# Patient Record
Sex: Male | Born: 1965 | Race: Black or African American | Hispanic: No | Marital: Married | Smoking: Former smoker
Health system: Southern US, Community
[De-identification: ages and names within clinical notes are randomized; demographics above are authoritative.]

## PROBLEM LIST (undated history)

## (undated) DIAGNOSIS — N289 Disorder of kidney and ureter, unspecified: Secondary | ICD-10-CM

## (undated) DIAGNOSIS — I509 Heart failure, unspecified: Secondary | ICD-10-CM

## (undated) DIAGNOSIS — R188 Other ascites: Secondary | ICD-10-CM

## (undated) DIAGNOSIS — K7460 Unspecified cirrhosis of liver: Secondary | ICD-10-CM

## (undated) DIAGNOSIS — I8500 Esophageal varices without bleeding: Secondary | ICD-10-CM

## (undated) DIAGNOSIS — N189 Chronic kidney disease, unspecified: Secondary | ICD-10-CM

## (undated) HISTORY — PX: ESOPHAGOGASTRODUODENOSCOPY W/ BANDING: SHX1530

---

## 2015-02-06 ENCOUNTER — Emergency Department (HOSPITAL_COMMUNITY): Payer: Medicaid - Out of State

## 2015-02-06 ENCOUNTER — Inpatient Hospital Stay (HOSPITAL_COMMUNITY)
Admission: EM | Admit: 2015-02-06 | Discharge: 2015-02-26 | DRG: 870 | Disposition: A | Discharge status: Home / Self Care | Payer: Medicaid - Out of State | Attending: Internal Medicine | Admitting: Internal Medicine

## 2015-02-06 ENCOUNTER — Encounter (HOSPITAL_COMMUNITY)
Admission: EM | Disposition: A | Discharge status: Home / Self Care | Payer: Self-pay | Source: Home / Self Care | Attending: Pulmonary Disease

## 2015-02-06 ENCOUNTER — Inpatient Hospital Stay (HOSPITAL_COMMUNITY): Payer: Medicaid - Out of State

## 2015-02-06 ENCOUNTER — Encounter (HOSPITAL_COMMUNITY): Payer: Self-pay | Admitting: *Deleted

## 2015-02-06 ENCOUNTER — Other Ambulatory Visit (HOSPITAL_COMMUNITY): Payer: PRIVATE HEALTH INSURANCE

## 2015-02-06 DIAGNOSIS — E669 Obesity, unspecified: Secondary | ICD-10-CM | POA: Diagnosis present

## 2015-02-06 DIAGNOSIS — R931 Abnormal findings on diagnostic imaging of heart and coronary circulation: Secondary | ICD-10-CM | POA: Diagnosis not present

## 2015-02-06 DIAGNOSIS — K7040 Alcoholic hepatic failure without coma: Secondary | ICD-10-CM | POA: Diagnosis present

## 2015-02-06 DIAGNOSIS — R1900 Intra-abdominal and pelvic swelling, mass and lump, unspecified site: Secondary | ICD-10-CM | POA: Diagnosis not present

## 2015-02-06 DIAGNOSIS — I8500 Esophageal varices without bleeding: Secondary | ICD-10-CM | POA: Diagnosis present

## 2015-02-06 DIAGNOSIS — D6959 Other secondary thrombocytopenia: Secondary | ICD-10-CM | POA: Diagnosis present

## 2015-02-06 DIAGNOSIS — J9601 Acute respiratory failure with hypoxia: Secondary | ICD-10-CM | POA: Diagnosis not present

## 2015-02-06 DIAGNOSIS — R34 Anuria and oliguria: Secondary | ICD-10-CM | POA: Diagnosis present

## 2015-02-06 DIAGNOSIS — Z87891 Personal history of nicotine dependence: Secondary | ICD-10-CM | POA: Diagnosis not present

## 2015-02-06 DIAGNOSIS — Z825 Family history of asthma and other chronic lower respiratory diseases: Secondary | ICD-10-CM | POA: Diagnosis not present

## 2015-02-06 DIAGNOSIS — N182 Chronic kidney disease, stage 2 (mild): Secondary | ICD-10-CM | POA: Diagnosis present

## 2015-02-06 DIAGNOSIS — R198 Other specified symptoms and signs involving the digestive system and abdomen: Secondary | ICD-10-CM

## 2015-02-06 DIAGNOSIS — K921 Melena: Secondary | ICD-10-CM | POA: Diagnosis not present

## 2015-02-06 DIAGNOSIS — Z7189 Other specified counseling: Secondary | ICD-10-CM

## 2015-02-06 DIAGNOSIS — G9340 Encephalopathy, unspecified: Secondary | ICD-10-CM | POA: Diagnosis present

## 2015-02-06 DIAGNOSIS — I509 Heart failure, unspecified: Secondary | ICD-10-CM

## 2015-02-06 DIAGNOSIS — K560 Paralytic ileus: Secondary | ICD-10-CM | POA: Diagnosis not present

## 2015-02-06 DIAGNOSIS — A419 Sepsis, unspecified organism: Principal | ICD-10-CM | POA: Diagnosis present

## 2015-02-06 DIAGNOSIS — Z6834 Body mass index (BMI) 34.0-34.9, adult: Secondary | ICD-10-CM | POA: Diagnosis not present

## 2015-02-06 DIAGNOSIS — I5033 Acute on chronic diastolic (congestive) heart failure: Secondary | ICD-10-CM | POA: Diagnosis present

## 2015-02-06 DIAGNOSIS — Z452 Encounter for adjustment and management of vascular access device: Secondary | ICD-10-CM

## 2015-02-06 DIAGNOSIS — K250 Acute gastric ulcer with hemorrhage: Secondary | ICD-10-CM | POA: Diagnosis present

## 2015-02-06 DIAGNOSIS — K7682 Hepatic encephalopathy: Secondary | ICD-10-CM

## 2015-02-06 DIAGNOSIS — K5900 Constipation, unspecified: Secondary | ICD-10-CM

## 2015-02-06 DIAGNOSIS — N179 Acute kidney failure, unspecified: Secondary | ICD-10-CM | POA: Diagnosis not present

## 2015-02-06 DIAGNOSIS — R531 Weakness: Secondary | ICD-10-CM

## 2015-02-06 DIAGNOSIS — E872 Acidosis: Secondary | ICD-10-CM | POA: Diagnosis present

## 2015-02-06 DIAGNOSIS — D696 Thrombocytopenia, unspecified: Secondary | ICD-10-CM | POA: Diagnosis present

## 2015-02-06 DIAGNOSIS — R0602 Shortness of breath: Secondary | ICD-10-CM

## 2015-02-06 DIAGNOSIS — K567 Ileus, unspecified: Secondary | ICD-10-CM

## 2015-02-06 DIAGNOSIS — J9600 Acute respiratory failure, unspecified whether with hypoxia or hypercapnia: Secondary | ICD-10-CM

## 2015-02-06 DIAGNOSIS — R188 Other ascites: Secondary | ICD-10-CM | POA: Diagnosis not present

## 2015-02-06 DIAGNOSIS — D684 Acquired coagulation factor deficiency: Secondary | ICD-10-CM | POA: Diagnosis present

## 2015-02-06 DIAGNOSIS — I4729 Other ventricular tachycardia: Secondary | ICD-10-CM

## 2015-02-06 DIAGNOSIS — I472 Ventricular tachycardia: Secondary | ICD-10-CM | POA: Diagnosis present

## 2015-02-06 DIAGNOSIS — K7031 Alcoholic cirrhosis of liver with ascites: Secondary | ICD-10-CM | POA: Diagnosis present

## 2015-02-06 DIAGNOSIS — R0902 Hypoxemia: Secondary | ICD-10-CM

## 2015-02-06 DIAGNOSIS — K7469 Other cirrhosis of liver: Secondary | ICD-10-CM | POA: Diagnosis not present

## 2015-02-06 DIAGNOSIS — Z515 Encounter for palliative care: Secondary | ICD-10-CM

## 2015-02-06 DIAGNOSIS — R6521 Severe sepsis with septic shock: Secondary | ICD-10-CM | POA: Diagnosis present

## 2015-02-06 DIAGNOSIS — B9689 Other specified bacterial agents as the cause of diseases classified elsewhere: Secondary | ICD-10-CM | POA: Diagnosis present

## 2015-02-06 DIAGNOSIS — J9690 Respiratory failure, unspecified, unspecified whether with hypoxia or hypercapnia: Secondary | ICD-10-CM

## 2015-02-06 DIAGNOSIS — E876 Hypokalemia: Secondary | ICD-10-CM | POA: Diagnosis not present

## 2015-02-06 DIAGNOSIS — R0600 Dyspnea, unspecified: Secondary | ICD-10-CM | POA: Diagnosis not present

## 2015-02-06 DIAGNOSIS — Z978 Presence of other specified devices: Secondary | ICD-10-CM

## 2015-02-06 DIAGNOSIS — Z0189 Encounter for other specified special examinations: Secondary | ICD-10-CM

## 2015-02-06 DIAGNOSIS — Z66 Do not resuscitate: Secondary | ICD-10-CM | POA: Diagnosis present

## 2015-02-06 DIAGNOSIS — K652 Spontaneous bacterial peritonitis: Secondary | ICD-10-CM | POA: Diagnosis present

## 2015-02-06 DIAGNOSIS — K7460 Unspecified cirrhosis of liver: Secondary | ICD-10-CM | POA: Diagnosis present

## 2015-02-06 DIAGNOSIS — K922 Gastrointestinal hemorrhage, unspecified: Secondary | ICD-10-CM | POA: Diagnosis not present

## 2015-02-06 DIAGNOSIS — Z8711 Personal history of peptic ulcer disease: Secondary | ICD-10-CM

## 2015-02-06 DIAGNOSIS — K7290 Hepatic failure, unspecified without coma: Secondary | ICD-10-CM | POA: Diagnosis not present

## 2015-02-06 DIAGNOSIS — D62 Acute posthemorrhagic anemia: Secondary | ICD-10-CM | POA: Diagnosis present

## 2015-02-06 DIAGNOSIS — I359 Nonrheumatic aortic valve disorder, unspecified: Secondary | ICD-10-CM | POA: Diagnosis not present

## 2015-02-06 DIAGNOSIS — N189 Chronic kidney disease, unspecified: Secondary | ICD-10-CM | POA: Diagnosis present

## 2015-02-06 DIAGNOSIS — Z4659 Encounter for fitting and adjustment of other gastrointestinal appliance and device: Secondary | ICD-10-CM

## 2015-02-06 DIAGNOSIS — T829XXA Unspecified complication of cardiac and vascular prosthetic device, implant and graft, initial encounter: Secondary | ICD-10-CM

## 2015-02-06 HISTORY — DX: Heart failure, unspecified: I50.9

## 2015-02-06 HISTORY — DX: Esophageal varices without bleeding: I85.00

## 2015-02-06 HISTORY — PX: ESOPHAGOGASTRODUODENOSCOPY: SHX5428

## 2015-02-06 HISTORY — DX: Chronic kidney disease, unspecified: N18.9

## 2015-02-06 HISTORY — DX: Unspecified cirrhosis of liver: K74.60

## 2015-02-06 HISTORY — DX: Other ascites: R18.8

## 2015-02-06 LAB — CBC
HEMATOCRIT: 18.2 % — AB (ref 39.0–52.0)
HEMOGLOBIN: 6.2 g/dL — AB (ref 13.0–17.0)
MCH: 28.3 pg (ref 26.0–34.0)
MCHC: 34.1 g/dL (ref 30.0–36.0)
MCV: 83.1 fL (ref 78.0–100.0)
PLATELETS: 101 10*3/uL — AB (ref 150–400)
RBC: 2.19 MIL/uL — ABNORMAL LOW (ref 4.22–5.81)
RDW: 16.7 % — ABNORMAL HIGH (ref 11.5–15.5)
WBC: 10 10*3/uL (ref 4.0–10.5)

## 2015-02-06 LAB — BLOOD GAS, ARTERIAL
Acid-base deficit: 0.8 mmol/L (ref 0.0–2.0)
Acid-base deficit: 1.9 mmol/L (ref 0.0–2.0)
Bicarbonate: 22.7 mEq/L (ref 20.0–24.0)
Bicarbonate: 21.7 mEq/L (ref 20.0–24.0)
DRAWN BY: 103701
Drawn by: 295031
FIO2: 1
MECHVT: 580 mL
O2 Content: 2 L/min
O2 Saturation: 98.6 %
O2 Saturation: 100 %
PATIENT TEMPERATURE: 98.6
PEEP/CPAP: 5 cmH2O
PO2 ART: 478 mmHg — AB (ref 80.0–100.0)
Patient temperature: 98.6
RATE: 15 resp/min
TCO2: 22.2 mmol/L (ref 0–100)
TCO2: 20.7 mmol/L (ref 0–100)
pCO2 arterial: 33.1 mmHg — ABNORMAL LOW (ref 35.0–45.0)
pCO2 arterial: 34.2 mmHg — ABNORMAL LOW (ref 35.0–45.0)
pH, Arterial: 7.45 (ref 7.350–7.450)
pH, Arterial: 7.419 (ref 7.350–7.450)
pO2, Arterial: 119 mmHg — ABNORMAL HIGH (ref 80.0–100.0)

## 2015-02-06 LAB — URINALYSIS, ROUTINE W REFLEX MICROSCOPIC
Glucose, UA: NEGATIVE mg/dL
KETONES UR: NEGATIVE mg/dL
NITRITE: NEGATIVE
Protein, ur: 30 mg/dL — AB
SPECIFIC GRAVITY, URINE: 1.02 (ref 1.005–1.030)
Urobilinogen, UA: 0.2 mg/dL (ref 0.0–1.0)
pH: 5.5 (ref 5.0–8.0)

## 2015-02-06 LAB — CBC WITH DIFFERENTIAL/PLATELET
Basophils Absolute: 0 10*3/uL (ref 0.0–0.1)
Basophils Relative: 1 % (ref 0–1)
Eosinophils Absolute: 0.1 10*3/uL (ref 0.0–0.7)
Eosinophils Relative: 1 % (ref 0–5)
HCT: 18 % — ABNORMAL LOW (ref 39.0–52.0)
HEMOGLOBIN: 6.1 g/dL — AB (ref 13.0–17.0)
Lymphocytes Relative: 38 % (ref 12–46)
Lymphs Abs: 3.2 10*3/uL (ref 0.7–4.0)
MCH: 28.8 pg (ref 26.0–34.0)
MCHC: 33.9 g/dL (ref 30.0–36.0)
MCV: 84.9 fL (ref 78.0–100.0)
MONO ABS: 1.1 10*3/uL — AB (ref 0.1–1.0)
MONOS PCT: 13 % — AB (ref 3–12)
NEUTROS PCT: 47 % (ref 43–77)
Neutro Abs: 4 10*3/uL (ref 1.7–7.7)
Platelets: 126 10*3/uL — ABNORMAL LOW (ref 150–400)
RBC: 2.12 MIL/uL — AB (ref 4.22–5.81)
RDW: 17.4 % — ABNORMAL HIGH (ref 11.5–15.5)
WBC: 8.5 10*3/uL (ref 4.0–10.5)

## 2015-02-06 LAB — APTT: APTT: 45 s — AB (ref 24–37)

## 2015-02-06 LAB — PROTIME-INR
INR: 1.87 — ABNORMAL HIGH (ref 0.00–1.49)
Prothrombin Time: 21.5 seconds — ABNORMAL HIGH (ref 11.6–15.2)

## 2015-02-06 LAB — I-STAT CG4 LACTIC ACID, ED: Lactic Acid, Venous: 2.99 mmol/L (ref 0.5–2.0)

## 2015-02-06 LAB — COMPREHENSIVE METABOLIC PANEL
ALT: 16 U/L — ABNORMAL LOW (ref 17–63)
ANION GAP: 2 — AB (ref 5–15)
AST: 49 U/L — AB (ref 15–41)
Albumin: 1.6 g/dL — ABNORMAL LOW (ref 3.5–5.0)
Alkaline Phosphatase: 75 U/L (ref 38–126)
BUN: 26 mg/dL — AB (ref 6–20)
CALCIUM: 7.3 mg/dL — AB (ref 8.9–10.3)
CO2: 24 mmol/L (ref 22–32)
Chloride: 110 mmol/L (ref 101–111)
Creatinine, Ser: 2.17 mg/dL — ABNORMAL HIGH (ref 0.61–1.24)
GFR calc Af Amer: 39 mL/min — ABNORMAL LOW (ref >60)
GFR calc non Af Amer: 33 mL/min — ABNORMAL LOW (ref >60)
Glucose, Bld: 107 mg/dL — ABNORMAL HIGH (ref 65–99)
Potassium: 3.8 mmol/L (ref 3.5–5.1)
Sodium: 136 mmol/L (ref 135–145)
Total Bilirubin: 1.8 mg/dL — ABNORMAL HIGH (ref 0.3–1.2)
Total Protein: 6.7 g/dL (ref 6.5–8.1)

## 2015-02-06 LAB — URINE MICROSCOPIC-ADD ON

## 2015-02-06 LAB — LACTIC ACID, PLASMA: Lactic Acid, Venous: 3.1 mmol/L (ref 0.5–2.0)

## 2015-02-06 LAB — PREPARE RBC (CROSSMATCH)

## 2015-02-06 LAB — I-STAT TROPONIN, ED: TROPONIN I, POC: 0 ng/mL (ref 0.00–0.08)

## 2015-02-06 LAB — GLUCOSE, CAPILLARY
Glucose-Capillary: 109 mg/dL — ABNORMAL HIGH (ref 65–99)
Glucose-Capillary: 107 mg/dL — ABNORMAL HIGH (ref 65–99)

## 2015-02-06 LAB — MRSA PCR SCREENING: MRSA by PCR: NEGATIVE

## 2015-02-06 LAB — ABO/RH: ABO/RH(D): O POS

## 2015-02-06 LAB — AMMONIA: AMMONIA: 101 umol/L — AB (ref 9–35)

## 2015-02-06 LAB — FIBRINOGEN: FIBRINOGEN: 108 mg/dL — AB (ref 204–475)

## 2015-02-06 LAB — BRAIN NATRIURETIC PEPTIDE: B Natriuretic Peptide: 25.4 pg/mL (ref 0.0–100.0)

## 2015-02-06 LAB — LIPASE, BLOOD: Lipase: 51 U/L (ref 22–51)

## 2015-02-06 SURGERY — EGD (ESOPHAGOGASTRODUODENOSCOPY)
Anesthesia: Moderate Sedation

## 2015-02-06 MED ORDER — SODIUM CHLORIDE 0.9 % IV BOLUS (SEPSIS)
1000.0000 mL | Freq: Once | INTRAVENOUS | Status: AC
  Administered 2015-02-06: 1000 mL via INTRAVENOUS

## 2015-02-06 MED ORDER — SODIUM CHLORIDE 0.9 % IV SOLN
Freq: Once | INTRAVENOUS | Status: AC
  Administered 2015-02-06: 17:00:00 via INTRAVENOUS

## 2015-02-06 MED ORDER — FENTANYL CITRATE (PF) 100 MCG/2ML IJ SOLN
25.0000 ug | INTRAMUSCULAR | Status: DC | PRN
  Administered 2015-02-06 – 2015-02-07 (×7): 50 ug via INTRAVENOUS
  Administered 2015-02-08: 25 ug via INTRAVENOUS
  Filled 2015-02-06 (×9): qty 2

## 2015-02-06 MED ORDER — SODIUM CHLORIDE 0.9 % IV SOLN
8.0000 mg/h | INTRAVENOUS | Status: DC
  Administered 2015-02-06: 8 mg/h via INTRAVENOUS
  Filled 2015-02-06 (×2): qty 80

## 2015-02-06 MED ORDER — FENTANYL CITRATE (PF) 100 MCG/2ML IJ SOLN
INTRAMUSCULAR | Status: AC
  Administered 2015-02-06: 50 ug
  Filled 2015-02-06: qty 4

## 2015-02-06 MED ORDER — LIDOCAINE HCL (CARDIAC) 20 MG/ML IV SOLN
INTRAVENOUS | Status: AC
  Filled 2015-02-06: qty 5

## 2015-02-06 MED ORDER — PROPOFOL 1000 MG/100ML IV EMUL
5.0000 ug/kg/min | INTRAVENOUS | Status: DC
Start: 2015-02-06 — End: 2015-02-11
  Administered 2015-02-06 (×3): 10 ug/kg/min via INTRAVENOUS
  Administered 2015-02-06 (×2): 25 ug/kg/min via INTRAVENOUS
  Administered 2015-02-06: 10 ug/kg/min via INTRAVENOUS
  Administered 2015-02-07 (×2): 15 ug/kg/min via INTRAVENOUS
  Filled 2015-02-06 (×5): qty 100

## 2015-02-06 MED ORDER — ANTISEPTIC ORAL RINSE SOLUTION (CORINZ)
7.0000 mL | Freq: Four times a day (QID) | OROMUCOSAL | Status: DC
  Administered 2015-02-07 – 2015-02-16 (×39): 7 mL via OROMUCOSAL

## 2015-02-06 MED ORDER — CEFTRIAXONE SODIUM 1 G IJ SOLR
1.0000 g | INTRAMUSCULAR | Status: DC
  Administered 2015-02-06 – 2015-02-09 (×4): 1 g via INTRAVENOUS
  Filled 2015-02-06 (×4): qty 10

## 2015-02-06 MED ORDER — ROCURONIUM BROMIDE 50 MG/5ML IV SOLN
INTRAVENOUS | Status: AC
  Filled 2015-02-06: qty 2

## 2015-02-06 MED ORDER — ETOMIDATE 2 MG/ML IV SOLN
INTRAVENOUS | Status: AC
  Filled 2015-02-06: qty 20

## 2015-02-06 MED ORDER — PROPOFOL 1000 MG/100ML IV EMUL
INTRAVENOUS | Status: AC
  Filled 2015-02-06: qty 100

## 2015-02-06 MED ORDER — ONDANSETRON HCL 4 MG/2ML IJ SOLN
4.0000 mg | Freq: Once | INTRAMUSCULAR | Status: AC
  Administered 2015-02-06: 4 mg via INTRAVENOUS
  Filled 2015-02-06: qty 2

## 2015-02-06 MED ORDER — ETOMIDATE 2 MG/ML IV SOLN
0.3000 mg/kg | Freq: Once | INTRAVENOUS | Status: AC
  Administered 2015-02-06: 22 mg via INTRAVENOUS
  Filled 2015-02-06: qty 15.51

## 2015-02-06 MED ORDER — ROCURONIUM BROMIDE 50 MG/5ML IV SOLN
1.0000 mg/kg | Freq: Once | INTRAVENOUS | Status: AC
  Administered 2015-02-06: 100 mg via INTRAVENOUS
  Filled 2015-02-06: qty 10.34

## 2015-02-06 MED ORDER — SODIUM CHLORIDE 0.9 % IV SOLN
Freq: Once | INTRAVENOUS | Status: AC
  Administered 2015-02-06: 23:00:00 via INTRAVENOUS

## 2015-02-06 MED ORDER — FENTANYL CITRATE (PF) 100 MCG/2ML IJ SOLN
50.0000 ug | Freq: Once | INTRAMUSCULAR | Status: AC
  Administered 2015-02-06: 50 ug via INTRAVENOUS

## 2015-02-06 MED ORDER — PANTOPRAZOLE SODIUM 40 MG IV SOLR
40.0000 mg | Freq: Two times a day (BID) | INTRAVENOUS | Status: DC
  Administered 2015-02-06 – 2015-02-18 (×24): 40 mg via INTRAVENOUS
  Filled 2015-02-06 (×23): qty 40

## 2015-02-06 MED ORDER — MIDAZOLAM HCL 2 MG/2ML IJ SOLN
INTRAMUSCULAR | Status: AC
  Filled 2015-02-06: qty 4

## 2015-02-06 MED ORDER — ONDANSETRON HCL 4 MG/2ML IJ SOLN
4.0000 mg | INTRAMUSCULAR | Status: DC | PRN
  Administered 2015-02-07: 4 mg via INTRAVENOUS
  Filled 2015-02-06: qty 2

## 2015-02-06 MED ORDER — CHLORHEXIDINE GLUCONATE 0.12% ORAL RINSE (MEDLINE KIT)
15.0000 mL | Freq: Two times a day (BID) | OROMUCOSAL | Status: DC
  Administered 2015-02-07 – 2015-02-16 (×19): 15 mL via OROMUCOSAL

## 2015-02-06 MED ORDER — OCTREOTIDE LOAD VIA INFUSION
50.0000 ug | Freq: Once | INTRAVENOUS | Status: AC
  Administered 2015-02-06: 50 ug via INTRAVENOUS
  Filled 2015-02-06: qty 25

## 2015-02-06 MED ORDER — SODIUM CHLORIDE 0.9 % IV SOLN
10.0000 mL/h | Freq: Once | INTRAVENOUS | Status: AC
  Administered 2015-02-06: 10 mL/h via INTRAVENOUS

## 2015-02-06 MED ORDER — SODIUM CHLORIDE 0.9 % IV SOLN
250.0000 mL | INTRAVENOUS | Status: DC | PRN
  Administered 2015-02-07: 250 mL via INTRAVENOUS

## 2015-02-06 MED ORDER — PANTOPRAZOLE SODIUM 40 MG IV SOLR
40.0000 mg | Freq: Once | INTRAVENOUS | Status: AC
  Administered 2015-02-06: 40 mg via INTRAVENOUS
  Filled 2015-02-06: qty 40

## 2015-02-06 MED ORDER — SODIUM CHLORIDE 0.9 % IV SOLN
50.0000 ug/h | INTRAVENOUS | Status: DC
  Administered 2015-02-06 – 2015-02-07 (×2): 50 ug/h via INTRAVENOUS
  Filled 2015-02-06 (×3): qty 1

## 2015-02-06 MED ORDER — SUCCINYLCHOLINE CHLORIDE 20 MG/ML IJ SOLN
INTRAMUSCULAR | Status: AC
Start: 2015-02-06 — End: 2015-02-07
  Filled 2015-02-06: qty 1

## 2015-02-06 MED ORDER — VITAMIN K1 10 MG/ML IJ SOLN
5.0000 mg | Freq: Once | INTRAVENOUS | Status: AC
  Administered 2015-02-06: 5 mg via INTRAVENOUS
  Filled 2015-02-06: qty 0.5

## 2015-02-06 MED ORDER — PANTOPRAZOLE SODIUM 40 MG IV SOLR
40.0000 mg | Freq: Once | INTRAVENOUS | Status: AC
  Administered 2015-02-08: 40 mg via INTRAVENOUS
  Filled 2015-02-06: qty 40

## 2015-02-06 MED ORDER — FUROSEMIDE 10 MG/ML IJ SOLN
20.0000 mg | Freq: Once | INTRAMUSCULAR | Status: AC
  Administered 2015-02-06: 20 mg via INTRAVENOUS
  Filled 2015-02-06: qty 2

## 2015-02-06 MED ORDER — SODIUM CHLORIDE 0.9 % IV SOLN: 80.0000 mg | Freq: Once | INTRAVENOUS | Status: DC

## 2015-02-06 NOTE — Interval H&P Note (Signed)
History and Physical Interval Note:  02/06/2015 4:34 PM  Francisco [name]  has presented today for surgery, with the diagnosis of varices  The various methods of treatment have been discussed with the patient and family. After consideration of risks, benefits and other options for treatment, the patient has consented to  Procedure(s) with comments: ESOPHAGOGASTRODUODENOSCOPY (EGD) (N/A) - at bedside in ICU as a surgical intervention .  The patient's history has been reviewed, patient examined, no change in status, stable for surgery.  I have reviewed the patient's chart and labs.  Questions were answered to the patient's satisfaction.     Rachael Fee

## 2015-02-06 NOTE — Procedures (Signed)
Central Line Insertion Procedure Note  Consent:  Informed consent was obtained from the patient after discussing the risks of the procedure including bleeding, infection, pneumothorax, and potentially death.  Laterality:  Left Internal Jugular Vein  Description of Procedure: Patient was prepped and draped in sterile fashion.  Using bedside ultrasound the patient's left internal jugular vein was visualized.  The vein was compressible and nonpulsatile.  A total of 1.5 mL of Lidocaine 1% with epinephrine was used to locally anesthetize the patient's skin.  Finder needle was then inserted into the patient's vein under direct ultrasound visualization and blood was aspirated.  The syringe was removed form the needle and blood flow was nonpulsatile.  The guidewire was then inserted through the needle into the patient's vein and needle was then removed.  Guidewire positioning in the vein was confirmed with bedside ultrasound. A skin nick was made with a sterile scalpel.  A dilator was passed over the guidewire into the subcutaneous tissue then removed.  The central venous catheter was then inserted over the guidewire into the vein.  The guidewire was then removed.  All ports were then aspirated and free of air before being flushed with sterile normal saline.  The ports were then clamped.  The catheter was sewen into place and a chlorhexidine bandage was applied.   Post Procedure: 1)  Stat Portable Chest X-ray ordered and showed no pneumothorax with good positioning at bedside 2)  Patient remains intubated for endoscopy procedure

## 2015-02-06 NOTE — Procedures (Signed)
Intubation Procedure Note  Meds 1. Etomidate approximately 26 mg IV 2. Rocuronium 100 mg IV  ETT: 7.5  DESCRIPTION OF PROCEDURE: Informed consent obtained prior to procedure. Timeout was performed to identify patient and procedure. A MacIntosh #4 blade laryngoscope was used to perform endotracheal intubation after patient was given etomidate followed by Rocuronium for rapid sequence intubation. There is complete view of vocal cords. Color change withgraphy occurred 3 times. Bilateral breath sounds were auscultated. Endotracheal tube was secured at 24 cm at the teeth. Patient was started on propofol drip for sedation. Postprocedure stat portable chest x-ray reviewed by me revealed good endotracheal tube positioning above the carina.

## 2015-02-06 NOTE — ED Notes (Signed)
Critical care MD in room speaking with patient and family

## 2015-02-06 NOTE — ED Notes (Signed)
1x unsuccessful lab draw R hand. Patient requested to DC lab draw. Per patient request, asked to see if they were going to need an IV and to wait for the nurse to come back- will notify RN Zella Ball

## 2015-02-06 NOTE — ED Notes (Signed)
Main lab is being called for remaining lab draws

## 2015-02-06 NOTE — H&P (Signed)
PULMONARY / CRITICAL CARE MEDICINE   Name: Francisco [name] MRN: [id number] DOB: 08/19/1965    ADMISSION DATE:  02/06/2015 CONSULTATION DATE:  02/06/15  REFERRING MD :  ED  CHIEF COMPLAINT:  Variceal Bleed & CHF  INITIAL PRESENTATION: Patient admitted with worsening dyspnea and lower extremity edema. Recently discharged from SNF after long hospitalization at OSH in [place] with GIB from esophageal varices post banding.  STUDIES:  Portable CXR 8/6 - Peribronchial cuffing. No consolidation or effusion.  SIGNIFICANT EVENTS: 8/6 - Admit to ICU 8/6 - Transfuse 1u PRBCs  HISTORY OF PRESENT ILLNESS:  [name] with recent prolonged hospitalization April-June 2016. Patient discharged from SNF 1 week ago. Reports he has been having increasing lower extremity edema and had PND last night but no orthopnea. Denies any chest pain or pressure. Ha shad increasing dyspnea as well. Has had some wheezing and nonproductive cough. Has been taking Lasix but reports he was on "2 pills" in the hospital before SNF. Minimal urine output. Has had some abdominal discomfort laying on his side but denies any melena or hematochezia. Had coffee ground emesis this morning and questionable "crimson" emesis per EMS/RN prior but no other nausea/vomiting. No syncope or near syncope. No subjective fever, chills, or sweats. Has received 2 L IVF bolus in ED along with Protonix IV and has been started on an Octreotide drip. ED physician consulted GI for evaluation.  PAST MEDICAL HISTORY :  Past Medical History  Diagnosis Date  . Hepatic cirrhosis   . Esophageal varices     last banding in [place] June 2016  . Chronic renal failure     was on hemodialysis in June 2016  . Congestive heart failure     questionable hx & on lasix  . Ascites    PAST SURGICAL HISTORY: Past Surgical History  Procedure Laterality Date  . Esophagogastroduodenoscopy w/ banding      Prior to Admission medications      HOME MEDICATIONS: 1.   Lactulose q8hr PO 2.  Lasix 40mg  po daily 3.  Ventolin inhaler prn  No Known Allergies  FAMILY HISTORY:  Family History  Problem Relation Age of Onset  . COPD Mother   . Asthma Mother   . Leukemia Maternal Grandmother   . Congestive Heart Failure Maternal Grandfather   . Cancer Other     2 aunts with known ca    SOCIAL HISTORY: History   Social History  . Marital Status: Married    Spouse Name: N/A  . Number of Children: N/A  . Years of Education: N/A   Social History Main Topics  . Smoking status: Former Smoker    Quit date: 10/02/2014  . Smokeless tobacco: Not on file     Comment: off and on for 20 years  . Alcohol Use: No     Comment: Quit drinking EtOH prior to prolonged admission in April 2016  . Drug Use: Not on file  . Sexual Activity: Not on file   Other Topics Concern  . None   Social History Narrative   Patient is from [place]. Hospitalized with variceal bleed and banding this Spring and discharged to [facility] June 2016. Discharged from SNF 1 week ago. Previously worked as a Paediatric nurse. Married.   REVIEW OF SYSTEMS: Denies any sore throat or sinus congestion. No rash or bruising. A pertinent 14 point review of systems is negative except as per the HPI.  SUBJECTIVE:   VITAL SIGNS: Temp:  [98 F (36.7 C)-98.1 F (36.7  C)] 98 F (36.7 C) (08/06 1111) Pulse Rate:  [112-121] 121 (08/06 1111) Resp:  [19-20] 19 (08/06 1111) BP: (100-123)/(73-102) 100/73 mmHg (08/06 1111) SpO2:  [98 %-100 %] 100 % (08/06 1111) HEMODYNAMICS:   VENTILATOR SETTINGS:   INTAKE / OUTPUT: No intake or output data in the 24 hours ending 02/06/15 1154  PHYSICAL EXAMINATION: General:  No distress. Laying nearly flat in bed. Wife & mother at bedside. Neuro:  Awake. Alert & oriented x3. Moving all 4 extremities equally. No meningismus. HEENT:  Tacky MM. No oral ulcers. No scleral injection. Cardiovascular:  Tachycardic. Pitting lower extremity edema. JVD present. Lungs:  Mild crackles  bilateral bases. Normal WOB laying recumbent. On [place] oxygen. Abdomen:  Protuberant. Fluid shift from ascites. Nontender. Musculoskeletal:  Normal tone. No joint deformity or effusion. Strength 5/5 grossly bilaterally. Skin:  Warm & dry. No rash.  LABS:  CBC  Recent Labs Lab 02/06/15 0930  WBC 8.5  HGB 6.1*  HCT 18.0*  PLT 126*   Coag's  Recent Labs Lab 02/06/15 0930  APTT 45*  INR 1.87*   BMET  Recent Labs Lab 02/06/15 0930  NA 136  K 3.8  CL 110  CO2 24  BUN 26*  CREATININE 2.17*  GLUCOSE 107*   Electrolytes  Recent Labs Lab 02/06/15 0930  CALCIUM 7.3*   Sepsis Markers  Recent Labs Lab 02/06/15 0944  LATICACIDVEN 2.99*   ABG  Recent Labs Lab 02/06/15 0856  PHART 7.450  PCO2ART 33.1*  PO2ART 119*   Liver Enzymes  Recent Labs Lab 02/06/15 0930  AST 49*  ALT 16*  ALKPHOS 75  BILITOT 1.8*  ALBUMIN 1.6*   Cardiac Enzymes No results for input(s): TROPONINI, PROBNP in the last 168 hours. Glucose No results for input(s): GLUCAP in the last 168 hours.  Imaging Dg Chest Port 1 View  02/06/2015   CLINICAL DATA:  49 year old male with shortness breath and chest pain for the past 12 hr. Vomiting.  EXAM: PORTABLE CHEST - 1 VIEW  COMPARISON:  No priors.  FINDINGS: Lung volumes are low. Severe diffuse peribronchial cuffing. No consolidative airspace disease. No pleural effusions. No evidence of pulmonary edema. Heart size is normal. Mediastinal contours are unremarkable.  IMPRESSION: 1. Severe diffuse peribronchial cuffing, concerning for severe acute bronchitis.   Electronically Signed   By: Trudie Reed M.D.   On: 02/06/2015 10:32     ASSESSMENT / PLAN:  PULMONARY A: Acute Hypoxic Respiratory Failure - No evidence of CHF on CXR.  P:   Wean FiO2 for Sat >92%. Pulmonary toilet with IS  CARDIOVASCULAR A:  Sinus Tachycardia Questionable h/o CHF  P:  Checking BNP Checking TTE Lasix  IV with transfusion Holding home  Lasix  RENAL A:   CKD - unknown baseline creatinine. Previously on HD. Lactic Acidosis  P:   Monitor UOP Trend daily BUN/Creatinine Monitor electrolytes daily with labs Trending LA q8hr  GASTROINTESTINAL A:   GIB - has been taking Aleve H/O Variceal Bleed - s/p OSH banding June 2016 Liver Cirrhosis Ascites  P:   Holding Lactulose for now NPO GI consulted for GIB Rocephin for SBP prophylaxis Trending Coags, Fibrinogen & Hgb/Hct Protonix gtt Octrotide gtt  HEMATOLOGIC A:   Anemia - Secondary to GIB vs volume overload Coagulopathy - Secondary to cirrhosis Thrombocytopenia - Mild.  P:  Trending Coags Stat fibrinogen Monitor Hgb/Hct q4hr Transfuse for Hgb <7.0 or active bleeding SCDs for DVT prophylaxis Dopplar [place] bilateral LE to rule out DVT  INFECTIOUS  A:   Ascites  P:   Trending leukocyte cough with labs daily  BCx2 8/6>>  Abx:  Rocephin, start date 8/6, day 1/x  ENDOCRINE A:   No acute issues.  P:   Accuchecks q4hr MD to be notified for BG <90 or >160  NEUROLOGIC A:   No acute issues.  P:   RASS goal: N/A Monitor for ecephalopathy off Lactulose Holding Lactulose   FAMILY  - Updates: Wife & mother present for goals of care and code status discussion today.  - Inter-disciplinary family meet or Palliative Care meeting due by:  8/13   TODAY'S SUMMARY: [name] with cirrhosis & ascites as well as known varices s/p banding June 2016. Seemingly has more of a CHF picture despite compliance with Lasix. Likely has a component of variceal bleeding from volume overload. Assessing cardiac function with TTE. GI consult pending for patient's GIB. Continuing Protonix for now in setting of Aleve use as well as Octreotide. Patient full code per my discussion with he and family present today.  I have spent a total of 40 minutes of critical care time today caring for this patient, dicussing goals of care with family, and reviewing the patient's  EMR.  Donna Christen Jamison Neighbor, M.D. [facility] Pager:  [phone] After 3pm or if no response, call [phone] 02/06/2015, 11:54 AM

## 2015-02-06 NOTE — Consult Note (Addendum)
Consultation  Referring Provider: ER MD Primary Care Physician:  Pcp Not In System Primary Gastroenterologist:  ?  Visiting from [place]  Reason for Consultation:  Vomiting blood  HPI: Francisco [name] is a 49 y.o. male  who presented this morning to the emergency room with complaints of weakness and shortness of breath. While being evaluated in the emergency room he vomited 4-500 mL of dark red blood. Apparently he had 1 episode of a similar amount of blood prior to arrival to the emergency room. On further questioning patient says his stools were black yesterday. He is a poor historian but apparently had recently been hospitalized in [place] and was visiting family here this weekend with his wife. He says he just got out of rehabilitation last week after a prolonged illness. 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 He knows that he has had at least one prior variceal bleed and underwent EGD and banding. He is irritable currently not wanting to ask questions and says he is given Aleve because his wife needs to be at work on Monday and he has a GI doctor in [place]. He states he is not currently actively drinking. Labs are reviewed hemoglobin was 6.3 on presentation INR 1.87 creatinine 2.1. There has been some difficulty with blood draws says he is a difficult stick and blood transfusions have not started as yet. Thus far is been hemodynamically stable.    History reviewed. No pertinent past medical history.  History reviewed. No pertinent past surgical history.  Prior to Admission medications   Not on File    Current Facility-Administered Medications  Medication Dose Route Frequency Provider Last Rate Last Dose  . 0.9 %  sodium chloride infusion  10 mL/hr Intravenous Once Lyndal Pulley, MD      . octreotide (SANDOSTATIN) 500 mcg in sodium chloride 0.9 % 250 mL (2 mcg/mL) infusion  50 mcg/hr  Intravenous Continuous Lyndal Pulley, MD 25 mL/hr at 02/06/15 1031 50 mcg/hr at 02/06/15 1031  . pantoprazole (PROTONIX) 80 mg in sodium chloride 0.9 % 250 mL (0.32 mg/mL) infusion  8 mg/hr Intravenous Continuous Lyndal Pulley, MD      . pantoprazole (PROTONIX) injection 40 mg  40 mg Intravenous Once Lyndal Pulley, MD       No current outpatient prescriptions on file.    Allergies as of 02/06/2015  . (No Known Allergies)    History reviewed. No pertinent family history.  History   Social History  . Marital Status: Married    Spouse Name: N/A  . Number of Children: N/A  . Years of Education: N/A   Occupational History  . Not on file.   Social History Main Topics  . Smoking status: Former Games developer  . Smokeless tobacco: Not on file  . Alcohol Use: No  . Drug Use: Not on file  . Sexual Activity: Not on file   Other Topics Concern  . Not on file   Social History Narrative  . No narrative on file    Review of Systems: Pertinent positive and negative review of systems were noted in the above HPI section.  All other review of systems was otherwise negative.  Physical Exam: Vital signs in last 24 hours: Temp:  [98.1 F (36.7 C)] 98.1 F (36.7 C) (08/06 0823) Pulse Rate:  [112] 112 (08/06 0823) Resp:  [20] 20 (08/06 0823) BP: (123)/(102) 123/102 mmHg (08/06 0823) SpO2:  [  98 %] 98 % (08/06 0823)   General:   Alert,  Well-developed, well-nourished, African-American male, tremulous and irritable Head:  Normocephalic and atraumatic. Eyes:  Sclera clear, no icterus.   Conjunctiva pale. Ears:  Normal auditory acuity. Nose:  No deformity, discharge,  or lesions. Mouth:  No deformity or lesions.   Neck:  Supple; no masses or thyromegaly. Lungs:  Clear throughout to auscultation.   No wheezes, crackles, or rhonchi. Heart: Tachycardia  Regular rate and rhythm; no murmurs, clicks, rubs,  or gallops. Abdomen:  Protuberant with tense ascites nontender bowel sounds are present no  definite palpable mass Rectal exam not done patent this patient had witnessed hematemesis in the ER Pulses:  Normal pulses noted. Extremities:  2+ edema to thighs Neurologic:  Alert and  oriented x4;  grossly normal neurologically. Skin:  Intact without significant lesions or rashes.. 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 Hemoglobin 6.3 on arrival. He apparently has prior history of variceal hemorrhage with banding. 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 #3 vitK ordered #4Will need EGD this afternoon in ICU (pt declined), and will need to be intubated for procedure-will discuss with CCM #5 cover with antibiotics in setting of variceal bleed/ascites #6suspect will need CIWA- he is currently threatening to leave [facility]    Amy Esterwood  02/06/2015, 11:11 AM   ________________________________________________________________________  [facility] MD note:  I personally examined the patient, reviewed the data and agree with the assessment and plan described above.  Variceal bleed likely, PUD also possible.  I  recommended that he have EGD this afternoon however he declined.  He is alert and oriented times 3 seems able to make his own medical decisions.  Wants to go home to [place] and see his doctor there instead.  He understands that he could die. In the end we agreed to continue IV meds (PPI, octreotide), transfuse blood products (including FFP and blood), give Vit K.  I will return to see him tomorrow. Please call sooner if there is suspicion of acute rebleeding.   Rob Bunting, MD [facility] Pager [phone]   Addendum: He decided to go ahead with EGD this afternoon after his wife and mother spoke with him.  Will plan for that after blood, FFP, elective ET intubation for airway protection.

## 2015-02-06 NOTE — ED Notes (Signed)
Report called to Volanda Napoleon, RN ICU  Pt to transfer to [phone]

## 2015-02-06 NOTE — Op Note (Signed)
[facility] [address] [place], [address]   ENDOSCOPY PROCEDURE REPORT  PATIENT: Francisco [name], Francisco [name]  MR#: [id number] BIRTHDATE: 1966/04/04 , 49  yrs. old GENDER: male ENDOSCOPIST: Rachael Fee, MD PROCEDURE DATE:  02/06/2015 PROCEDURE:  EGD, diagnostic ASA CLASS:     Class IV INDICATIONS:  cirrhosis, melena, hematemesis; band ligated in [place] 2-3 weeks ago. MEDICATIONS: intubated for airway protection in ICU TOPICAL ANESTHETIC: none  DESCRIPTION OF PROCEDURE: After the risks benefits and alternatives of the procedure were thoroughly explained, informed consent was obtained.  The Pentax Gastroscope [id number] endoscope was introduced through the mouth and advanced to the second portion of the duodenum , Without limitations.  The instrument was slowly withdrawn as the mucosa was fully examined.  There were multiple ulcers, somewhat confluent, at the GE junction. There were no esophageal or gastric varices.  The GE junction ulcers were consistent with ulcers seen after band ligation.  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 There were several small distal gastric ulcers, these were all clean based.  There was no blood in the UGI tract.         The scope was then withdrawn from the patient and the procedure completed.  COMPLICATIONS: There were no immediate complications.  ENDOSCOPIC IMPRESSION: GE junction ulcerations, likely from previous band ligation (in [place]) without any signs of persistent varices.  There were also several small peptic type clean based ulcers in the distal stomach that are likely from his daily NSAID use.  RECOMMENDATIONS: Continue octreotide drip (for another 24-36 hours).  I will change the protonix drip to twice daily IV dosing.  After extubation he can start low salt diet.  He has massive ascites and will likely need large volume paracentesis before discharge. He plans to follow up with [place]  doctors next week.  Should avoid NSAIDs strictly. If tolerated, he should start non-selective B Blocker in addition to adjusting his diuretic regimen prior to discharge.   eSigned:  Rachael Fee, MD 02/06/2015 5:27 PM

## 2015-02-06 NOTE — ED Notes (Signed)
Bed: ZO10 Expected date: 02/06/15 Expected time: 8:06 AM Means of arrival:  Comments: EMS SOB, abd distention

## 2015-02-06 NOTE — Progress Notes (Signed)
eLink Physician-Brief Progress Note Patient Name: Francisco [name] DOB: 1966-04-17 MRN: [id number]   Date of Service  02/06/2015  HPI/Events of Note  Oliguria.   eICU Interventions  Will monitor CVP.      Intervention Category Intermediate Interventions: Oliguria - evaluation and management  Sommer,Steven Eugene 02/06/2015, 11:54 PM

## 2015-02-06 NOTE — H&P (View-Only) (Signed)
Consultation  Referring Provider: ER MD Primary Care Physician:  Pcp Not In System Primary Gastroenterologist:  ?  Visiting from [place]  Reason for Consultation:  Vomiting blood  HPI: Francisco [name] is a 48 y.o. male  who presented this morning to the emergency room with complaints of weakness and shortness of breath. While being evaluated in the emergency room he vomited 4-500 mL of dark red blood. Apparently he had 1 episode of a similar amount of blood prior to arrival to the emergency room. On further questioning patient says his stools were black yesterday. He is a poor historian but apparently had recently been hospitalized in [place] and was visiting family here this weekend with his wife. He says he just got out of rehabilitation last week after a prolonged illness. 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 He knows that he has had at least one prior variceal bleed and underwent EGD and banding. He is irritable currently not wanting to ask questions and says he is given Aleve because his wife needs to be at work on Monday and he has a GI doctor in [place]. He states he is not currently actively drinking. Labs are reviewed hemoglobin was 6.3 on presentation INR 1.87 creatinine 2.1. There has been some difficulty with blood draws says he is a difficult stick and blood transfusions have not started as yet. Thus far is been hemodynamically stable.    History reviewed. No pertinent past medical history.  History reviewed. No pertinent past surgical history.  Prior to Admission medications   Not on File    Current Facility-Administered Medications  Medication Dose Route Frequency Provider Last Rate Last Dose  . 0.9 %  sodium chloride infusion  10 mL/hr Intravenous Once Lyndal Pulley, MD      . octreotide (SANDOSTATIN) 500 mcg in sodium chloride 0.9 % 250 mL (2 mcg/mL) infusion  50 mcg/hr  Intravenous Continuous Lyndal Pulley, MD 25 mL/hr at 02/06/15 1031 50 mcg/hr at 02/06/15 1031  . pantoprazole (PROTONIX) 80 mg in sodium chloride 0.9 % 250 mL (0.32 mg/mL) infusion  8 mg/hr Intravenous Continuous Lyndal Pulley, MD      . pantoprazole (PROTONIX) injection 40 mg  40 mg Intravenous Once Lyndal Pulley, MD       No current outpatient prescriptions on file.    Allergies as of 02/06/2015  . (No Known Allergies)    History reviewed. No pertinent family history.  History   Social History  . Marital Status: Married    Spouse Name: N/A  . Number of Children: N/A  . Years of Education: N/A   Occupational History  . Not on file.   Social History Main Topics  . Smoking status: Former Games developer  . Smokeless tobacco: Not on file  . Alcohol Use: No  . Drug Use: Not on file  . Sexual Activity: Not on file   Other Topics Concern  . Not on file   Social History Narrative  . No narrative on file    Review of Systems: Pertinent positive and negative review of systems were noted in the above HPI section.  All other review of systems was otherwise negative.  Physical Exam: Vital signs in last 24 hours: Temp:  [98.1 F (36.7 C)] 98.1 F (36.7 C) (08/06 0823) Pulse Rate:  [112] 112 (08/06 0823) Resp:  [20] 20 (08/06 0823) BP: (123)/(102) 123/102 mmHg (08/06 0823) SpO2:  [  98 %] 98 % (08/06 0823)   General:   Alert,  Well-developed, well-nourished, African-American male, tremulous and irritable Head:  Normocephalic and atraumatic. Eyes:  Sclera clear, no icterus.   Conjunctiva pale. Ears:  Normal auditory acuity. Nose:  No deformity, discharge,  or lesions. Mouth:  No deformity or lesions.   Neck:  Supple; no masses or thyromegaly. Lungs:  Clear throughout to auscultation.   No wheezes, crackles, or rhonchi. Heart: Tachycardia  Regular rate and rhythm; no murmurs, clicks, rubs,  or gallops. Abdomen:  Protuberant with tense ascites nontender bowel sounds are present no  definite palpable mass Rectal exam not done patent this patient had witnessed hematemesis in the ER Pulses:  Normal pulses noted. Extremities:  2+ edema to thighs Neurologic:  Alert and  oriented x4;  grossly normal neurologically. Skin:  Intact without significant lesions or rashes.. 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 Hemoglobin 6.3 on arrival. He apparently has prior history of variceal hemorrhage with banding. 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 #3 vitK ordered #4Will need EGD this afternoon in ICU (pt declined), and will need to be intubated for procedure-will discuss with CCM #5 cover with antibiotics in setting of variceal bleed/ascites #6suspect will need CIWA- he is currently threatening to leave AMA    Amy Esterwood  02/06/2015, 11:11 AM   ________________________________________________________________________  [facility] MD note:  I personally examined the patient, reviewed the data and agree with the assessment and plan described above.  Variceal bleed likely, PUD also possible.  I  recommended that he have EGD this afternoon however he declined.  He is alert and oriented times 3 seems able to make his own medical decisions.  Wants to go home to [place] and see his doctor there instead.  He understands that he could die. In the end we agreed to continue IV meds (PPI, octreotide), transfuse blood products (including FFP and blood), give Vit K.  I will return to see him tomorrow. Please call sooner if there is suspicion of acute rebleeding.   Rob Bunting, MD [facility] Pager [phone]   Addendum: He decided to go ahead with EGD this afternoon after his wife and mother spoke with him.  Will plan for that after blood, FFP, elective ET intubation for airway protection.

## 2015-02-06 NOTE — ED Notes (Signed)
Unable to get 2nd set of cultures, pt  Upset and won't allow me to palpate hand veins. Pt states it hurts.

## 2015-02-06 NOTE — ED Notes (Signed)
Pt here with sob and abdominal distention

## 2015-02-06 NOTE — ED Provider Notes (Signed)
CSN: [id number]     Arrival date & time 02/06/15  0818 History   First MD Initiated Contact with Patient 02/06/15 [phone]     Chief Complaint  Patient presents with  . Shortness of Breath     (Consider location/radiation/quality/duration/timing/severity/associated sxs/prior Treatment) Patient is a 49 y.o. male presenting with shortness of breath. The history is provided by the patient.  Shortness of Breath Severity:  Moderate Onset quality:  Gradual Duration:  1 day Timing:  Constant Progression:  Worsening Chronicity:  New Relieved by:  Nothing Worsened by:  Nothing tried Ineffective treatments:  None tried Associated symptoms: no fever   Associated symptoms comment:  Leg swelling, thirsty Risk factors comment:  Cirrhosis, ascites, prior renal failure requiring dialysis, from [facility]   History reviewed. No pertinent past medical history. History reviewed. No pertinent past surgical history. History reviewed. No pertinent family history. History  Substance Use Topics  . Smoking status: Former Games developer  . Smokeless tobacco: Not on file  . Alcohol Use: No    Review of Systems  Constitutional: Negative for fever.  Respiratory: Positive for shortness of breath.   All other systems reviewed and are negative.     Allergies  Review of patient's allergies indicates no known allergies.  Home Medications   Prior to Admission medications   Not on File   BP 123/102 mmHg  Pulse 112  Temp(Src) 98.1 F (36.7 C)  Resp 20  SpO2 98% Physical Exam  Constitutional: He is oriented to person, place, and time. He appears well-developed and well-nourished. He appears ill. No distress.  HENT:  Head: Normocephalic and atraumatic.  Dry, cracked lips  Eyes: Conjunctivae are normal.  Neck: Neck supple. No tracheal deviation present.  Cardiovascular: Regular rhythm.  Tachycardia present.   Pulmonary/Chest: Effort normal. No respiratory distress. He has no wheezes.  Low inspiratory  volumes  Abdominal: He exhibits distension and ascites. There is no tenderness.  Neurological: He is alert and oriented to person, place, and time. GCS eye subscore is 4. GCS verbal subscore is 5. GCS motor subscore is 6.  Skin: Skin is warm and dry.  Psychiatric: He has a normal mood and affect.    ED Course  Procedures (including critical care time) Procedure note: Ultrasound Guided Peripheral IV Ultrasound guided peripheral 1.88 inch angiocath IV placement performed by me. Indications: Nursing unable to place IV. Details: The antecubital fossa and upper arm were evaluated with a multifrequency linear probe. Patent brachial veins were noted. 1 attempt was made to cannulate a vein under realtime US guidance with successful cannulation of the vein and catheter placement. There is return of non-pulsatile dark red blood. The patient tolerated the procedure well without complications.   CPT codes: 40102 and [phone]   CRITICAL CARE Performed by: Lyndal Pulley Total critical care time: 39 Critical care time was exclusive of separately billable procedures and treating other patients. Critical care was necessary to treat or prevent imminent or life-threatening deterioration. Critical care was time spent personally by me on the following activities: development of treatment plan with patient and/or surrogate as well as nursing, discussions with consultants, evaluation of patient's response to treatment, examination of patient, obtaining history from patient or surrogate, ordering and performing treatments and interventions, ordering and review of laboratory studies, ordering and review of radiographic studies, pulse oximetry and re-evaluation of patient's condition.   Labs Review Labs Reviewed  CBC WITH DIFFERENTIAL/PLATELET - Abnormal; Notable for the following:    RBC 2.12 (*)  Hemoglobin 6.1 (*)    HCT 18.0 (*)    RDW 17.4 (*)    Platelets 126 (*)    Monocytes Relative 13 (*)    Monocytes  Absolute 1.1 (*)    All other components within normal limits  APTT - Abnormal; Notable for the following:    aPTT 45 (*)    All other components within normal limits  PROTIME-INR - Abnormal; Notable for the following:    Prothrombin Time 21.5 (*)    INR 1.87 (*)    All other components within normal limits  BLOOD GAS, ARTERIAL - Abnormal; Notable for the following:    pCO2 arterial 33.1 (*)    pO2, Arterial 119 (*)    All other components within normal limits  I-STAT CG4 LACTIC ACID, ED - Abnormal; Notable for the following:    Lactic Acid, Venous 2.99 (*)    All other components within normal limits  CULTURE, BLOOD (ROUTINE X 2)  CULTURE, BLOOD (ROUTINE X 2)  COMPREHENSIVE METABOLIC PANEL  LIPASE, BLOOD  URINALYSIS, ROUTINE W REFLEX MICROSCOPIC (NOT AT [facility])  AMMONIA  I-STAT TROPOININ, ED  TYPE AND SCREEN  PREPARE RBC (CROSSMATCH)    Imaging Review Dg Chest Port 1 View  02/06/2015   CLINICAL DATA:  49 year old male with shortness breath and chest pain for the past 12 hr. Vomiting.  EXAM: PORTABLE CHEST - 1 VIEW  COMPARISON:  No priors.  FINDINGS: Lung volumes are low. Severe diffuse peribronchial cuffing. No consolidative airspace disease. No pleural effusions. No evidence of pulmonary edema. Heart size is normal. Mediastinal contours are unremarkable.  IMPRESSION: 1. Severe diffuse peribronchial cuffing, concerning for severe acute bronchitis.   Electronically Signed   By: Trudie Reed M.D.   On: 02/06/2015 10:32   I independently viewed and interpreted the above radiology studies and agree with radiologist report.   EKG Interpretation   Date/Time:  Saturday February 06 2015 08:43:20 EDT Ventricular Rate:  128 PR Interval:  130 QRS Duration: 68 QT Interval:  315 QTC Calculation: 460 R Axis:   -22 Text Interpretation:  Sinus tachycardia Ventricular premature complex LVH  with secondary repolarization abnormality Anterior Q waves, possibly due  to LVH No previous ECGs  available Confirmed by Reyli Schroth MD, Eira Alpert ([id number]) on  02/06/2015 8:47:22 AM      MDM   Final diagnoses:  Shortness of breath  Acute blood loss anemia  Acute upper GI bleed  Cirrhosis of liver with ascites, unspecified hepatic cirrhosis type    49 year old male presents with shortness of breath that started last night accompanied by leg swelling. He states that he feels dehydrated, has felt unwell, and recently traveled here from [place] to visit family. He has a history of cirrhosis and liver failure, renal failure, and prior hospitalization with intubation for unknown reason. His EKG is sinus tachycardia without evidence of acute ischemia, he appears to be breathing appropriately and is not requiring supplemental oxygen to maintain his O2 sat. While awaiting initial lab work patient had an episode of 500 mL of red vomit concerning for variceal bleed given his ascites and ongoing liver failure issues. After discussion with family appears that he has had banding previously for similar issues.  First i-STAT sample hemoglobin is 5.2 confirmed critical low by CBC hemoglobin of 6, 2 units of emergency release blood were ordered gastroenterology was consulted for emergent scope, consult to critical care for admission to ICU level of monitoring. Patient remained in critical condition with persistent tachycardia but  maintained normotension throughout his emergency department course.  Lyndal Pulley, MD 02/06/15 [phone]

## 2015-02-06 NOTE — ED Notes (Signed)
Family at bedside. 

## 2015-02-06 NOTE — Progress Notes (Signed)
CRITICAL VALUE ALERT  Critical value received:  Lactic acid 3.1  Date of notification: 02/06/15  Time of notification: 2003  Critical value read back yes  Nurse who received alert:  Cathryn RN  MD notified (1st page):  Mongul  Time of first page:  2030  MD notified (2nd page):none  Time of second page:none  Responding MD:  Mongul  Time MD responded:  2055

## 2015-02-06 NOTE — ED Notes (Signed)
Notiefed MD Clydene Pugh of iStat lactic result

## 2015-02-07 ENCOUNTER — Inpatient Hospital Stay (HOSPITAL_COMMUNITY): Payer: Medicaid - Out of State

## 2015-02-07 DIAGNOSIS — R0600 Dyspnea, unspecified: Secondary | ICD-10-CM

## 2015-02-07 DIAGNOSIS — R0902 Hypoxemia: Secondary | ICD-10-CM

## 2015-02-07 DIAGNOSIS — K922 Gastrointestinal hemorrhage, unspecified: Secondary | ICD-10-CM

## 2015-02-07 DIAGNOSIS — K7031 Alcoholic cirrhosis of liver with ascites: Secondary | ICD-10-CM

## 2015-02-07 LAB — BODY FLUID CELL COUNT WITH DIFFERENTIAL
Eos, Fluid: 0 %
LYMPHS FL: 45 %
MONOCYTE-MACROPHAGE-SEROUS FLUID: 47 % — AB (ref 50–90)
Neutrophil Count, Fluid: 8 % (ref 0–25)
WBC FLUID: 22 uL (ref 0–1000)

## 2015-02-07 LAB — PREPARE FRESH FROZEN PLASMA
UNIT DIVISION: 0
Unit division: 0

## 2015-02-07 LAB — CBC
HCT: 19.9 % — ABNORMAL LOW (ref 39.0–52.0)
HCT: 22.5 % — ABNORMAL LOW (ref 39.0–52.0)
HEMATOCRIT: 18.6 % — AB (ref 39.0–52.0)
HEMOGLOBIN: 6.7 g/dL — AB (ref 13.0–17.0)
Hemoglobin: 6.4 g/dL — CL (ref 13.0–17.0)
Hemoglobin: 7.9 g/dL — ABNORMAL LOW (ref 13.0–17.0)
MCH: 28.4 pg (ref 26.0–34.0)
MCH: 27.8 pg (ref 26.0–34.0)
MCH: 29.2 pg (ref 26.0–34.0)
MCHC: 34.4 g/dL (ref 30.0–36.0)
MCHC: 33.7 g/dL (ref 30.0–36.0)
MCHC: 35.1 g/dL (ref 30.0–36.0)
MCV: 82.7 fL (ref 78.0–100.0)
MCV: 82.6 fL (ref 78.0–100.0)
MCV: 83 fL (ref 78.0–100.0)
PLATELETS: 80 10*3/uL — AB (ref 150–400)
PLATELETS: 70 10*3/uL — AB (ref 150–400)
Platelets: 76 10*3/uL — ABNORMAL LOW (ref 150–400)
RBC: 2.25 MIL/uL — ABNORMAL LOW (ref 4.22–5.81)
RBC: 2.41 MIL/uL — AB (ref 4.22–5.81)
RBC: 2.71 MIL/uL — AB (ref 4.22–5.81)
RDW: 16.2 % — ABNORMAL HIGH (ref 11.5–15.5)
RDW: 15.7 % — ABNORMAL HIGH (ref 11.5–15.5)
RDW: 15.7 % — ABNORMAL HIGH (ref 11.5–15.5)
WBC: 8.7 10*3/uL (ref 4.0–10.5)
WBC: 6.8 10*3/uL (ref 4.0–10.5)
WBC: 6.5 10*3/uL (ref 4.0–10.5)

## 2015-02-07 LAB — PHOSPHORUS: Phosphorus: 4.2 mg/dL (ref 2.5–4.6)

## 2015-02-07 LAB — COMPREHENSIVE METABOLIC PANEL
ALT: 16 U/L — ABNORMAL LOW (ref 17–63)
AST: 40 U/L (ref 15–41)
Albumin: 1.8 g/dL — ABNORMAL LOW (ref 3.5–5.0)
Alkaline Phosphatase: 58 U/L (ref 38–126)
Anion gap: 7 (ref 5–15)
BUN: 32 mg/dL — AB (ref 6–20)
CALCIUM: 7.6 mg/dL — AB (ref 8.9–10.3)
CO2: 23 mmol/L (ref 22–32)
Chloride: 109 mmol/L (ref 101–111)
Creatinine, Ser: 2.36 mg/dL — ABNORMAL HIGH (ref 0.61–1.24)
GFR calc Af Amer: 35 mL/min — ABNORMAL LOW (ref >60)
GFR, EST NON AFRICAN AMERICAN: 30 mL/min — AB (ref >60)
Glucose, Bld: 107 mg/dL — ABNORMAL HIGH (ref 65–99)
Potassium: 2.9 mmol/L — ABNORMAL LOW (ref 3.5–5.1)
Sodium: 139 mmol/L (ref 135–145)
Total Bilirubin: 2.2 mg/dL — ABNORMAL HIGH (ref 0.3–1.2)
Total Protein: 6.3 g/dL — ABNORMAL LOW (ref 6.5–8.1)

## 2015-02-07 LAB — PREPARE RBC (CROSSMATCH)

## 2015-02-07 LAB — BASIC METABOLIC PANEL
ANION GAP: 5 (ref 5–15)
BUN: 34 mg/dL — ABNORMAL HIGH (ref 6–20)
CHLORIDE: 113 mmol/L — AB (ref 101–111)
CO2: 22 mmol/L (ref 22–32)
Calcium: 7.4 mg/dL — ABNORMAL LOW (ref 8.9–10.3)
Creatinine, Ser: 2.32 mg/dL — ABNORMAL HIGH (ref 0.61–1.24)
GFR, EST AFRICAN AMERICAN: 36 mL/min — AB (ref >60)
GFR, EST NON AFRICAN AMERICAN: 31 mL/min — AB (ref >60)
GLUCOSE: 92 mg/dL (ref 65–99)
Potassium: 3.1 mmol/L — ABNORMAL LOW (ref 3.5–5.1)
Sodium: 140 mmol/L (ref 135–145)

## 2015-02-07 LAB — CREATININE, URINE, RANDOM: Creatinine, Urine: 226.43 mg/dL

## 2015-02-07 LAB — GRAM STAIN

## 2015-02-07 LAB — LACTIC ACID, PLASMA: LACTIC ACID, VENOUS: 1.9 mmol/L (ref 0.5–2.0)

## 2015-02-07 LAB — MAGNESIUM: Magnesium: 1.7 mg/dL (ref 1.7–2.4)

## 2015-02-07 LAB — GLUCOSE, CAPILLARY
GLUCOSE-CAPILLARY: 104 mg/dL — AB (ref 65–99)
Glucose-Capillary: 93 mg/dL (ref 65–99)
Glucose-Capillary: 101 mg/dL — ABNORMAL HIGH (ref 65–99)
Glucose-Capillary: 105 mg/dL — ABNORMAL HIGH (ref 65–99)
Glucose-Capillary: 93 mg/dL (ref 65–99)

## 2015-02-07 LAB — LACTATE DEHYDROGENASE, PLEURAL OR PERITONEAL FLUID: LD, Fluid: 35 U/L — ABNORMAL HIGH (ref 3–23)

## 2015-02-07 LAB — PROTIME-INR
INR: 1.77 — AB (ref 0.00–1.49)
Prothrombin Time: 20.6 seconds — ABNORMAL HIGH (ref 11.6–15.2)

## 2015-02-07 LAB — SODIUM, URINE, RANDOM

## 2015-02-07 LAB — FIBRINOGEN: Fibrinogen: 123 mg/dL — ABNORMAL LOW (ref 204–475)

## 2015-02-07 LAB — APTT: aPTT: 60 seconds — ABNORMAL HIGH (ref 24–37)

## 2015-02-07 MED ORDER — ALBUMIN HUMAN 25 % IV SOLN
50.0000 g | Freq: Once | INTRAVENOUS | Status: AC
  Administered 2015-02-07: 50 g via INTRAVENOUS
  Filled 2015-02-07: qty 50

## 2015-02-07 MED ORDER — MAGNESIUM SULFATE IN D5W 10-5 MG/ML-% IV SOLN
1.0000 g | Freq: Once | INTRAVENOUS | Status: AC
  Administered 2015-02-07: 1 g via INTRAVENOUS
  Filled 2015-02-07: qty 100

## 2015-02-07 MED ORDER — ALBUMIN HUMAN 25 % IV SOLN
INTRAVENOUS | Status: AC
  Filled 2015-02-07: qty 50

## 2015-02-07 MED ORDER — SODIUM CHLORIDE 0.9 % IV BOLUS (SEPSIS)
1000.0000 mL | Freq: Once | INTRAVENOUS | Status: AC
  Administered 2015-02-07: 1000 mL via INTRAVENOUS

## 2015-02-07 MED ORDER — VITAMINS A & D EX OINT
TOPICAL_OINTMENT | CUTANEOUS | Status: AC
  Administered 2015-02-07: 1
  Filled 2015-02-07: qty 5

## 2015-02-07 MED ORDER — ALBUMIN HUMAN 25 % IV SOLN
25.0000 g | Freq: Once | INTRAVENOUS | Status: AC
  Administered 2015-02-08: 25 g via INTRAVENOUS
  Filled 2015-02-07: qty 100

## 2015-02-07 MED ORDER — SODIUM CHLORIDE 0.9 % IV SOLN
Freq: Once | INTRAVENOUS | Status: AC
  Administered 2015-02-07: 18:00:00 via INTRAVENOUS

## 2015-02-07 MED ORDER — SODIUM CHLORIDE 0.9 % IV BOLUS (SEPSIS)
250.0000 mL | Freq: Once | INTRAVENOUS | Status: AC
  Administered 2015-02-07: 250 mL via INTRAVENOUS

## 2015-02-07 MED ORDER — POTASSIUM CHLORIDE 10 MEQ/50ML IV SOLN
10.0000 meq | INTRAVENOUS | Status: AC
  Administered 2015-02-07 (×3): 10 meq via INTRAVENOUS
  Filled 2015-02-07 (×4): qty 50

## 2015-02-07 MED ORDER — SODIUM CHLORIDE 0.9 % IV SOLN: Freq: Once | INTRAVENOUS | Status: DC

## 2015-02-07 NOTE — Progress Notes (Deleted)
  Echocardiogram 2D Echocardiogram has been performed.  Meleny Tregoning A 02/07/2015, 9:30 AM 

## 2015-02-07 NOTE — Progress Notes (Signed)
Utilization review complete. Johnny Latu RN CCM Case Mgmt phone [phone] 

## 2015-02-07 NOTE — Progress Notes (Signed)
eLink Physician-Brief Progress Note Patient Name: Francisco [name] DOB: May 12, 1966 MRN: [id number]   Date of Service  02/07/2015  HPI/Events of Note  Hypotension - SBP in 70's. Patient had paracentesis for 3.8 Liters today. Suspect he is re-accumulating fluid in his peritoneum.   eICU Interventions  Will order: 1. 25% Albumin 25 gm IV now. 2. 0.9 NaCl 1 liter IV over 1 hour now.      Intervention Category Major Interventions: Hypotension - evaluation and management  Sommer,Steven Eugene 02/07/2015, 11:28 PM

## 2015-02-07 NOTE — Progress Notes (Signed)
E-Link MD made aware of patient's SBP running in the 80s, MAP has been > 65 , MD want patient to be monitored and RN to call of SBP go lower.

## 2015-02-07 NOTE — Progress Notes (Deleted)
Bedside consult with Dr. Celene Skeen and Resp/ therapist to manage severe ventilator dysynchony. Versed added to sedation regimen; currently on Fentanyl at max dose of ; Precedex max at 1.2 mcq; Versd at . Vent synchrony achieved. Will closely monitor VS and wean as appropriate.

## 2015-02-07 NOTE — Progress Notes (Signed)
eLink Physician-Brief Progress Note Patient Name: Francisco [name] DOB: 03-23-1966 MRN: [id number]   Date of Service  02/07/2015  HPI/Events of Note  Multiple issues: 1. K+ = 2.9 and Creatinine = 2.36 and 2. Hgb = 6.2 >> 6.4 s/p PRBC transfusion.  eICU Interventions  Will order: 1. Replete KCl cautiously. 2. Transfuse 1 unit PRBC now.      Intervention Category Intermediate Interventions: Electrolyte abnormality - evaluation and management;Other:  Sommer,Steven Dennard Nip 02/07/2015, 4:55 AM

## 2015-02-07 NOTE — Procedures (Addendum)
After d/w PCCM, request for US paracentesis of up to 3-4 liters max. Successful US guided paracentesis from RLQ.  Yielded 3.8L of cloudy yellow fluid.  No immediate complications.  Pt tolerated well.   Specimen was sent for labs.  Brayton El PA-C 02/07/2015 12:06 PM

## 2015-02-07 NOTE — Progress Notes (Signed)
eLink Physician-Brief Progress Note Patient Name: Francisco [name] DOB: [birth year] MRN: [id number]   Date of Service  02/07/2015  HPI/Events of Note  Hb=6.7  eICU Interventions  Transfuse 1u PRBC     Intervention Category Intermediate Interventions: Coagulopathy - evaluation and management  Tranell Wojtkiewicz 02/07/2015, 4:52 PM

## 2015-02-07 NOTE — Progress Notes (Signed)
  Echocardiogram 2D Echocardiogram has been performed.  Virgina Evener A 02/07/2015, 9:30 AM

## 2015-02-07 NOTE — Progress Notes (Signed)
Central Line Withdraw:  1cc of Lidocaine 1% used to locally anesthetize. Central line was withdrawn approximately 3-4 cm under sterile procedure.  Sterile dressing applied and patient sewn into place. Stat portable CXR ordered to confirm placement.  Donna Christen Jamison Neighbor, M.D. [facility] Pager:  [phone] After 3pm or if no response, call [phone]

## 2015-02-07 NOTE — Progress Notes (Signed)
*  Preliminary Results* Bilateral lower extremity venous duplex completed. Study was technically limited due to edema. Visualized veins of bilateral lower extremities are negative for deep vein thrombosis. There is no evidence of Baker's cyst bilaterally.  02/07/2015 9:13 AM  Gertie Fey, RVT, RDCS, RDMS

## 2015-02-07 NOTE — Progress Notes (Signed)
eLink Physician-Brief Progress Note Patient Name: Francisco [name] DOB: July 15, 1965 MRN: [id number]   Date of Service  02/07/2015  HPI/Events of Note  Oliguria. BP = 93/64 and CVP = 14-16. Hx of liver disease. Hepatorenal?  eICU Interventions  Will order: 1. BMP, Urine Sodium and Urine Creatinine now.      Intervention Category Intermediate Interventions: Oliguria - evaluation and management  Sommer,Steven Eugene 02/07/2015, 2:12 AM

## 2015-02-07 NOTE — Progress Notes (Deleted)
Pt is on Bipap 12/6 for OSA with 6Lpm of oxygen bled in via home nasal pillows. Pt does not want to go on Bipap with full face mask to decrease CO2 due to claustrophobia but is willing to try around 2200. RT will return at that time.  

## 2015-02-07 NOTE — Progress Notes (Signed)
PULMONARY / CRITICAL CARE MEDICINE   Name: Francisco [name] MRN: [id number] DOB: [birth year]    ADMISSION DATE:  02/06/2015 CONSULTATION DATE:  02/06/15  REFERRING MD :  ED  CHIEF COMPLAINT:  Variceal Bleed & CHF  INITIAL PRESENTATION: Patient admitted with worsening dyspnea and lower extremity edema. Recently discharged from SNF after long hospitalization at OSH in [place] with GIB from esophageal varices post banding.  STUDIES:  Portable CXR 8/6 - Peribronchial cuffing. No consolidation or effusion.  SIGNIFICANT EVENTS: 8/6 - Admit to ICU 8/6 - Transfuse 1u PRBCs 8/6 - Elective intubation & Left IJ placed 8/6 - EGD w/o varices but w/ small peptic ulcers  SUBJECTIVE:  Patient with episodes of hypotension overnight. Low potassium this morning. Maintained on Propofol drip overnight. UOP declining.  ROS:  Unobtainable as patient is intubated & sedated.  VITAL SIGNS: Temp:  [94.5 F (34.7 C)-98.1 F (36.7 C)] 94.5 F (34.7 C) (08/07 0600) Pulse Rate:  [90-161] 90 (08/07 0700) Resp:  [10-28] 14 (08/07 0700) BP: (79-167)/(44-102) 79/44 mmHg (08/07 0700) SpO2:  [98 %-100 %] 100 % (08/07 0700) FiO2 (%):  [30 %-100 %] 30 % (08/07 0406) Weight:  [227 lb 15.3 oz (103.4 kg)] 227 lb 15.3 oz (103.4 kg) (08/06 1300) HEMODYNAMICS: CVP:  [10 mmHg-16 mmHg] 10 mmHg VENTILATOR SETTINGS: Vent Mode:  [-] PRVC FiO2 (%):  [30 %-100 %] 30 % Set Rate:  [15 bmp] 15 bmp Vt Set:  [580 mL] 580 mL PEEP:  [5 cmH20] 5 cmH20 Plateau Pressure:  [23 cmH20-25 cmH20] 24 cmH20 INTAKE / OUTPUT:  Intake/Output Summary (Last 24 hours) at 02/07/15 0811 Last data filed at 02/07/15 0700  Gross per 24 hour  Intake 2699.13 ml  Output    175 ml  Net 2524.13 ml    PHYSICAL EXAMINATION: General:  No distress. Laying on left side for echo. Sedated. Neuro:  Sedated. PERRL. HEENT:  Tacky MM. ETT in place. No scler injection. Left IJ in place. Cardiovascular:  Reg rate. Pitting lower extremity edema.  Lungs:  Mild  crackles bilateral bases. Symmetric chest rise on vent.  Abdomen:  Protuberant. Fluid shift from ascites but still soft.  Musculoskeletal:  No joint deformity or effusion.  Skin:  Warm & dry. No rash.  LABS:  CBC  Recent Labs Lab 02/06/15 0930 02/06/15 1830 02/07/15 0340  WBC 8.5 10.0 8.7  HGB 6.1* 6.2* 6.4*  HCT 18.0* 18.2* 18.6*  PLT 126* 101* 80*   Coag's  Recent Labs Lab 02/06/15 0930 02/07/15 0340  APTT 45* 60*  INR 1.87* 1.77*   BMET  Recent Labs Lab 02/06/15 0930 02/07/15 0340  NA 136 139  K 3.8 2.9*  CL 110 109  CO2 24 23  BUN 26* 32*  CREATININE 2.17* 2.36*  GLUCOSE 107* 107*   Electrolytes  Recent Labs Lab 02/06/15 0930 02/07/15 0340  CALCIUM 7.3* 7.6*  MG  --  1.7  PHOS  --  4.2   Sepsis Markers  Recent Labs Lab 02/06/15 0944 02/06/15 1830 02/07/15 0340  LATICACIDVEN 2.99* 3.1* 1.9   ABG  Recent Labs Lab 02/06/15 0856 02/06/15 1843  PHART 7.450 7.419  PCO2ART 33.1* 34.2*  PO2ART 119* 478*   Liver Enzymes  Recent Labs Lab 02/06/15 0930 02/07/15 0340  AST 49* 40  ALT 16* 16*  ALKPHOS 75 58  BILITOT 1.8* 2.2*  ALBUMIN 1.6* 1.8*   Cardiac Enzymes No results for input(s): TROPONINI, PROBNP in the last 168 hours. Glucose  Recent Labs Lab 02/06/15  1613 02/06/15 2017  GLUCAP 109* 107*    Imaging Dg Chest Port 1 View  02/06/2015   CLINICAL DATA:  Patient with stroke status post line placement.  EXAM: PORTABLE CHEST - 1 VIEW  COMPARISON:  Earlier same date  FINDINGS: Interval insertion of endotracheal tube terminating 15 mm superior to the carina. New left IJ central venous catheter tip projects over the right atrium. Stable cardiac and mediastinal contours. Low lung volumes. Unchanged diffuse bilateral coarse heterogeneous opacities. No pleural effusion or pneumothorax.  IMPRESSION: New left IJ central venous catheter tip projects over the right atrium, consider retraction.  ET tube terminates in the distal trachea.   Unchanged heterogeneous opacities bilaterally potentially secondary to bronchitis.  These results were called by telephone at the time of interpretation on 02/06/2015 at 4:37 pm to Dr. Lawanda Cousins , who verbally acknowledged these results.   Electronically Signed   By: Annia Belt M.D.   On: 02/06/2015 16:38   Dg Chest Port 1 View  02/06/2015   CLINICAL DATA:  49 year old male with shortness breath and chest pain for the past 12 hr. Vomiting.  EXAM: PORTABLE CHEST - 1 VIEW  COMPARISON:  No priors.  FINDINGS: Lung volumes are low. Severe diffuse peribronchial cuffing. No consolidative airspace disease. No pleural effusions. No evidence of pulmonary edema. Heart size is normal. Mediastinal contours are unremarkable.  IMPRESSION: 1. Severe diffuse peribronchial cuffing, concerning for severe acute bronchitis.   Electronically Signed   By: Trudie Reed M.D.   On: 02/06/2015 10:32   Dg Chest Port 1v Same Day  02/06/2015   CLINICAL DATA:  Hypoxia. Requested per MD to assess whether radiographic distortion affects prior imaging of IJ line and ETT  EXAM: PORTABLE CHEST - 1 VIEW SAME DAY  COMPARISON:  02/06/2015 at 1604 hours  FINDINGS: Left internal jugular central venous line catheter tip again projects in the right atrium.  Endotracheal tube tip projects 1 cm above the carina. This is also stable.  Lung volumes are low. There is interstitial prominence but no areas of lung consolidation and no overt pulmonary edema. No pneumothorax.  IMPRESSION: 1. No change from the earlier study. 2. Left internal jugular central venous line tip again projects in the right atrium. Endotracheal tube tip projects 1 cm above the carina.   Electronically Signed   By: Amie Portland M.D.   On: 02/06/2015 18:00     ASSESSMENT / PLAN:  PULMONARY OETT 8/6>> A: Acute Hypoxic Respiratory Failure - No evidence of CHF on CXR.  P:   Wean FiO2 for Sat >92%. Pulmonary toilet with IS  CARDIOVASCULAR L IJ CVC 8/6>> A:  Sinus  Tachycardia - resolved Questionable h/o CHF  P:  TTE pending Lasix  IV with transfusion Holding home Lasix  RENAL A:   CKD - unknown baseline creatinine. Previously on HD. Lactic Acidosis - Resolving Hypokalemia - Replacing IV  P:   Stat renal US UOsm pending Monitor UOP Maintain MAP>65 Trend daily BUN/Creatinine Monitor electrolytes daily with labs Repeat BMP @ 1500  GASTROINTESTINAL A:   GIB - has been taking Aleve H/O Variceal Bleed - s/p OSH banding June 2016. EGD w/o varices Liver Cirrhosis Ascites Peptic Ulcers  P:   Holding Lactulose for now NPO GI following Rocephin for SBP prophylaxis Trending Coags, Fibrinogen & Hgb/Hct Protonix IV bid D/C Octreotide gtt  HEMATOLOGIC A:   Anemia - Secondary to GIB vs volume overload Coagulopathy - Secondary to cirrhosis Thrombocytopenia - Mild.  P:  S/P FFP & Vitamin K Trending Coags Transfusing 1u PRBCs Monitor Hgb/Hct q4hr Transfuse for Hgb <7.0 or active bleeding SCDs for DVT prophylaxis Dopplar [place] pending  INFECTIOUS A:   Ascites  P:   Trending leukocyte cough with labs daily  BCx2 8/6>>  Abx:  Rocephin, start date 8/6, day 2/x  ENDOCRINE A:   No acute issues.  P:   Accuchecks q4hr MD to be notified for BG <90 or >160  NEUROLOGIC A:   No acute issues.  P:   RASS goal: 0 to -1 Propofol gtt Fentanyl IV prn Holding Lactulose   FAMILY  - Updates: No family at bedside for update.  - Inter-disciplinary family meet or Palliative Care meeting due by:  8/13   TODAY'S SUMMARY: Patient now w/ hypotension with sedation. Holding sedation & giving IVF bolus. Replacing electrolytes & transfusing blood today. Reposition CVC. Patient likely will need paracentesis. UOP marginal & concerning.  I have spent a total of 31 minutes of critical care time today caring for this patient, dicussing goals of care with family, and reviewing the patient's EMR.  Donna Christen Jamison Neighbor, M.D. [facility]  Pulmonary & Critical Care Pager:  [phone] After 3pm or if no response, call [phone] 02/07/2015, 8:11 AM

## 2015-02-07 NOTE — Progress Notes (Addendum)
[facility] Progress Note    Since last GI note: EGD yesterday, see report in chart.  No overt bleeding overnight however Hb is not bumping as expected (he's had 2 units blood in is starting the third unit soon).  Objective: Vital signs in last 24 hours: Temp:  [94.5 F (34.7 C)-98.1 F (36.7 C)] 97.4 F (36.3 C) (08/07 0800) Pulse Rate:  [89-161] 96 (08/07 0830) Resp:  [10-28] 27 (08/07 0830) BP: (66-167)/(44-96) 93/47 mmHg (08/07 0830) SpO2:  [100 %] 100 % (08/07 0830) FiO2 (%):  [30 %-100 %] 30 % (08/07 0842) Weight:  [227 lb 15.3 oz (103.4 kg)] 227 lb 15.3 oz (103.4 kg) (08/06 1300) Last BM Date: 02/05/15 General: intubated, looking about the room Heart: regular rate and rythm Abdomen: soft, non-tender, massive ascites (tense) normal bowel sounds   Lab Results:  Recent Labs  02/06/15 0930 02/06/15 1830 02/07/15 0340  WBC 8.5 10.0 8.7  HGB 6.1* 6.2* 6.4*  PLT 126* 101* 80*  MCV 84.9 83.1 82.7    Recent Labs  02/06/15 0930 02/07/15 0340  NA 136 139  K 3.8 2.9*  CL 110 109  CO2 24 23  GLUCOSE 107* 107*  BUN 26* 32*  CREATININE 2.17* 2.36*  CALCIUM 7.3* 7.6*    Recent Labs  02/06/15 0930 02/07/15 0340  PROT 6.7 6.3*  ALBUMIN 1.6* 1.8*  AST 49* 40  ALT 16* 16*  ALKPHOS 75 58  BILITOT 1.8* 2.2*    Recent Labs  02/06/15 0930 02/07/15 0340  INR 1.87* 1.77*    Studies/Results: Dg Chest Port 1 View  02/06/2015   CLINICAL DATA:  Patient with stroke status post line placement.  EXAM: PORTABLE CHEST - 1 VIEW  COMPARISON:  Earlier same date  FINDINGS: Interval insertion of endotracheal tube terminating 15 mm superior to the carina. New left IJ central venous catheter tip projects over the right atrium. Stable cardiac and mediastinal contours. Low lung volumes. Unchanged diffuse bilateral coarse heterogeneous opacities. No pleural effusion or pneumothorax.  IMPRESSION: New left IJ central venous catheter tip projects over the right atrium,  consider retraction.  ET tube terminates in the distal trachea.  Unchanged heterogeneous opacities bilaterally potentially secondary to bronchitis.  These results were called by telephone at the time of interpretation on 02/06/2015 at 4:37 pm to Dr. Lawanda Cousins , who verbally acknowledged these results.   Electronically Signed   By: Annia Belt M.D.   On: 02/06/2015 16:38   Dg Chest Port 1 View  02/06/2015   CLINICAL DATA:  50 year old male with shortness breath and chest pain for the past 12 hr. Vomiting.  EXAM: PORTABLE CHEST - 1 VIEW  COMPARISON:  No priors.  FINDINGS: Lung volumes are low. Severe diffuse peribronchial cuffing. No consolidative airspace disease. No pleural effusions. No evidence of pulmonary edema. Heart size is normal. Mediastinal contours are unremarkable.  IMPRESSION: 1. Severe diffuse peribronchial cuffing, concerning for severe acute bronchitis.   Electronically Signed   By: Trudie Reed M.D.   On: 02/06/2015 10:32   Dg Chest Port 1v Same Day  02/06/2015   CLINICAL DATA:  Hypoxia. Requested per MD to assess whether radiographic distortion affects prior imaging of IJ line and ETT  EXAM: PORTABLE CHEST - 1 VIEW SAME DAY  COMPARISON:  02/06/2015 at 1604 hours  FINDINGS: Left internal jugular central venous line catheter tip again projects in the right atrium.  Endotracheal tube tip projects 1 cm above the carina. This is also stable.  Lung volumes are low. There is interstitial prominence but no areas of lung consolidation and no overt pulmonary edema. No pneumothorax.  IMPRESSION: 1. No change from the earlier study. 2. Left internal jugular central venous line tip again projects in the right atrium. Endotracheal tube tip projects 1 cm above the carina.   Electronically Signed   By: Amie Portland M.D.   On: 02/06/2015 18:00     Medications: Scheduled Meds: . sodium chloride   Intravenous Once  . antiseptic oral rinse  7 mL Mouth Rinse QID  . cefTRIAXone (ROCEPHIN)  IV  1 g  Intravenous Q24H  . chlorhexidine gluconate  15 mL Mouth Rinse BID  . magnesium sulfate 1 - 4 g bolus IVPB  1 g Intravenous Once  . pantoprazole (PROTONIX) IV  40 mg Intravenous Once  . pantoprazole (PROTONIX) IV  40 mg Intravenous Q12H  . sodium chloride  250 mL Intravenous Once   Continuous Infusions: . propofol (DIPRIVAN) infusion 12.5 mcg/kg/min (02/07/15 0831)   PRN Meds:.sodium chloride, fentaNYL (SUBLIMAZE) injection, ondansetron (ZOFRAN) IV    Assessment/Plan: 49 y.o. male with Etoh cirrhosis, decompensated, with GI bleeding, fluid overload  Source of bleeding is probably from the ulcerated GE junction (sites of previous variceal ligation).  He had no blood in UGI tract yesterday and so I think the bleeding has stopped.  Octreotide was stopped. IV PPI twice daily now.  I think his fluid overload is his biggest issue currently and he will need LVP (I will order). Would be best to be done today to facilitate weaning off vent.  Fluid will need to be sent for SBP check, cytology as well and I will ask that he receive periprocedureal IV albumine (50gm). Current MELD is 24.   Rachael Fee, MD  02/07/2015, 9:06 AM  Gastroenterology Pager [phone]

## 2015-02-08 ENCOUNTER — Encounter (HOSPITAL_COMMUNITY): Payer: Self-pay | Admitting: Gastroenterology

## 2015-02-08 ENCOUNTER — Inpatient Hospital Stay (HOSPITAL_COMMUNITY): Payer: Medicaid - Out of State

## 2015-02-08 DIAGNOSIS — R188 Other ascites: Secondary | ICD-10-CM

## 2015-02-08 DIAGNOSIS — K7469 Other cirrhosis of liver: Secondary | ICD-10-CM

## 2015-02-08 DIAGNOSIS — K7290 Hepatic failure, unspecified without coma: Secondary | ICD-10-CM

## 2015-02-08 LAB — COMPREHENSIVE METABOLIC PANEL
ALBUMIN: 2.5 g/dL — AB (ref 3.5–5.0)
ALK PHOS: 48 U/L (ref 38–126)
ALT: 13 U/L — ABNORMAL LOW (ref 17–63)
AST: 37 U/L (ref 15–41)
Anion gap: 7 (ref 5–15)
BILIRUBIN TOTAL: 2.3 mg/dL — AB (ref 0.3–1.2)
BUN: 33 mg/dL — AB (ref 6–20)
CALCIUM: 7.8 mg/dL — AB (ref 8.9–10.3)
CHLORIDE: 112 mmol/L — AB (ref 101–111)
CO2: 22 mmol/L (ref 22–32)
Creatinine, Ser: 1.96 mg/dL — ABNORMAL HIGH (ref 0.61–1.24)
GFR calc Af Amer: 44 mL/min — ABNORMAL LOW (ref >60)
GFR calc non Af Amer: 38 mL/min — ABNORMAL LOW (ref >60)
Glucose, Bld: 92 mg/dL (ref 65–99)
Potassium: 3.2 mmol/L — ABNORMAL LOW (ref 3.5–5.1)
SODIUM: 141 mmol/L (ref 135–145)
TOTAL PROTEIN: 6.2 g/dL — AB (ref 6.5–8.1)

## 2015-02-08 LAB — GLUCOSE, CAPILLARY
GLUCOSE-CAPILLARY: 98 mg/dL (ref 65–99)
GLUCOSE-CAPILLARY: 112 mg/dL — AB (ref 65–99)
Glucose-Capillary: 73 mg/dL (ref 65–99)
Glucose-Capillary: 89 mg/dL (ref 65–99)
Glucose-Capillary: 92 mg/dL (ref 65–99)
Glucose-Capillary: 95 mg/dL (ref 65–99)
Glucose-Capillary: 100 mg/dL — ABNORMAL HIGH (ref 65–99)

## 2015-02-08 LAB — CBC
HCT: 23.3 % — ABNORMAL LOW (ref 39.0–52.0)
HCT: 25.5 % — ABNORMAL LOW (ref 39.0–52.0)
HEMOGLOBIN: 8.1 g/dL — AB (ref 13.0–17.0)
HEMOGLOBIN: 8.9 g/dL — AB (ref 13.0–17.0)
MCH: 28.7 pg (ref 26.0–34.0)
MCH: 29.2 pg (ref 26.0–34.0)
MCHC: 34.8 g/dL (ref 30.0–36.0)
MCHC: 34.9 g/dL (ref 30.0–36.0)
MCV: 82.6 fL (ref 78.0–100.0)
MCV: 83.6 fL (ref 78.0–100.0)
Platelets: 74 10*3/uL — ABNORMAL LOW (ref 150–400)
Platelets: 82 10*3/uL — ABNORMAL LOW (ref 150–400)
RBC: 2.82 MIL/uL — ABNORMAL LOW (ref 4.22–5.81)
RBC: 3.05 MIL/uL — AB (ref 4.22–5.81)
RDW: 15.5 % (ref 11.5–15.5)
RDW: 16.2 % — AB (ref 11.5–15.5)
WBC: 7.7 10*3/uL (ref 4.0–10.5)
WBC: 8.7 10*3/uL (ref 4.0–10.5)

## 2015-02-08 LAB — MAGNESIUM: MAGNESIUM: 2 mg/dL (ref 1.7–2.4)

## 2015-02-08 LAB — PHOSPHORUS: PHOSPHORUS: 3.4 mg/dL (ref 2.5–4.6)

## 2015-02-08 LAB — TRIGLYCERIDES: TRIGLYCERIDES: 75 mg/dL (ref <150)

## 2015-02-08 LAB — PROTIME-INR
INR: 1.8 — ABNORMAL HIGH (ref 0.00–1.49)
PROTHROMBIN TIME: 20.9 s — AB (ref 11.6–15.2)

## 2015-02-08 LAB — APTT: aPTT: 45 seconds — ABNORMAL HIGH (ref 24–37)

## 2015-02-08 LAB — FIBRINOGEN: Fibrinogen: 108 mg/dL — ABNORMAL LOW (ref 204–475)

## 2015-02-08 MED ORDER — THIAMINE HCL 100 MG/ML IJ SOLN
100.0000 mg | Freq: Every day | INTRAMUSCULAR | Status: DC
  Administered 2015-02-08 – 2015-02-16 (×9): 100 mg via INTRAVENOUS
  Filled 2015-02-08 (×9): qty 2

## 2015-02-08 MED ORDER — PHENYLEPHRINE HCL 10 MG/ML IJ SOLN
0.0000 ug/min | INTRAVENOUS | Status: DC
  Administered 2015-02-08: 120 ug/min via INTRAVENOUS
  Administered 2015-02-08: 100 ug/min via INTRAVENOUS
  Administered 2015-02-08: 120 ug/min via INTRAVENOUS
  Administered 2015-02-08: 90 ug/min via INTRAVENOUS
  Administered 2015-02-09: 120 ug/min via INTRAVENOUS
  Administered 2015-02-09: 100 ug/min via INTRAVENOUS
  Administered 2015-02-09: 95 ug/min via INTRAVENOUS
  Administered 2015-02-10: 135 ug/min via INTRAVENOUS
  Administered 2015-02-10: 70 ug/min via INTRAVENOUS
  Administered 2015-02-10 – 2015-02-11 (×2): 135 ug/min via INTRAVENOUS
  Administered 2015-02-11: 125 ug/min via INTRAVENOUS
  Administered 2015-02-12: 115 ug/min via INTRAVENOUS
  Administered 2015-02-13: 110 ug/min via INTRAVENOUS
  Administered 2015-02-13 (×2): 95 ug/min via INTRAVENOUS
  Administered 2015-02-13 (×2): 100 ug/min via INTRAVENOUS
  Administered 2015-02-14: 80 ug/min via INTRAVENOUS
  Administered 2015-02-14: 100 ug/min via INTRAVENOUS
  Administered 2015-02-14: 90 ug/min via INTRAVENOUS
  Administered 2015-02-15: 75 ug/min via INTRAVENOUS
  Filled 2015-02-08 (×28): qty 4

## 2015-02-08 MED ORDER — FENTANYL CITRATE (PF) 100 MCG/2ML IJ SOLN
100.0000 ug | INTRAMUSCULAR | Status: DC | PRN
  Administered 2015-02-08 – 2015-02-09 (×2): 100 ug via INTRAVENOUS
  Filled 2015-02-08 (×2): qty 2

## 2015-02-08 MED ORDER — SODIUM CHLORIDE 0.9 % IV SOLN
25.0000 ug/h | INTRAVENOUS | Status: DC
  Administered 2015-02-08: 25 ug/h via INTRAVENOUS
  Filled 2015-02-08: qty 50

## 2015-02-08 MED ORDER — DEXTROSE IN LACTATED RINGERS 5 % IV SOLN
INTRAVENOUS | Status: DC
  Administered 2015-02-08: 1000 mL via INTRAVENOUS
  Administered 2015-02-10: 18:00:00 via INTRAVENOUS
  Administered 2015-02-10: 1000 mL via INTRAVENOUS
  Administered 2015-02-12 – 2015-02-14 (×4): via INTRAVENOUS
  Administered 2015-02-16: 1000 mL via INTRAVENOUS
  Administered 2015-02-16: 06:00:00 via INTRAVENOUS
  Administered 2015-02-20: 10 mL via INTRAVENOUS

## 2015-02-08 MED ORDER — SODIUM CHLORIDE 0.9 % IV SOLN
2000.0000 mg | INTRAVENOUS | Status: AC
  Administered 2015-02-08: 2000 mg via INTRAVENOUS
  Filled 2015-02-08: qty 2000

## 2015-02-08 MED ORDER — POTASSIUM CHLORIDE 20 MEQ/15ML (10%) PO SOLN
20.0000 meq | Freq: Once | ORAL | Status: AC
  Administered 2015-02-08: 20 meq
  Filled 2015-02-08: qty 15

## 2015-02-08 MED ORDER — FENTANYL CITRATE (PF) 100 MCG/2ML IJ SOLN
100.0000 ug | INTRAMUSCULAR | Status: AC | PRN
  Administered 2015-02-08 (×3): 100 ug via INTRAVENOUS
  Filled 2015-02-08 (×2): qty 2

## 2015-02-08 MED ORDER — POTASSIUM CHLORIDE 10 MEQ/50ML IV SOLN
10.0000 meq | INTRAVENOUS | Status: AC
  Administered 2015-02-08 (×3): 10 meq via INTRAVENOUS
  Filled 2015-02-08 (×3): qty 50

## 2015-02-08 MED ORDER — SODIUM CHLORIDE 0.9 % IV SOLN
25.0000 ug/h | INTRAVENOUS | Status: DC
  Administered 2015-02-09: 200 ug/h via INTRAVENOUS
  Filled 2015-02-08: qty 50

## 2015-02-08 MED ORDER — SODIUM CHLORIDE 0.9 % IV SOLN
1.0000 mg | Freq: Once | INTRAVENOUS | Status: DC
  Filled 2015-02-08: qty 0.2

## 2015-02-08 MED ORDER — VANCOMYCIN HCL 10 G IV SOLR
1250.0000 mg | INTRAVENOUS | Status: DC
  Administered 2015-02-09: 1250 mg via INTRAVENOUS
  Filled 2015-02-08 (×2): qty 1250

## 2015-02-08 MED ORDER — LACTULOSE 10 GM/15ML PO SOLN
30.0000 g | Freq: Three times a day (TID) | ORAL | Status: DC
  Administered 2015-02-08 – 2015-02-09 (×3): 30 g
  Filled 2015-02-08 (×3): qty 45

## 2015-02-08 MED ORDER — FOLIC ACID 5 MG/ML IJ SOLN
1.0000 mg | Freq: Once | INTRAMUSCULAR | Status: AC
  Administered 2015-02-08: 1 mg via INTRAVENOUS
  Filled 2015-02-08 (×2): qty 0.2

## 2015-02-08 MED ORDER — PHENYLEPHRINE HCL 10 MG/ML IJ SOLN
0.0000 ug/min | INTRAMUSCULAR | Status: DC
  Administered 2015-02-08: 20 ug/min via INTRAVENOUS
  Filled 2015-02-08: qty 1

## 2015-02-08 MED ORDER — FENTANYL CITRATE (PF) 100 MCG/2ML IJ SOLN
100.0000 ug | INTRAMUSCULAR | Status: AC | PRN
  Administered 2015-02-08 (×3): 100 ug via INTRAVENOUS
  Filled 2015-02-08: qty 2

## 2015-02-08 NOTE — Progress Notes (Signed)
Progress Note   Subjective  On vent. Off fentanyl but not yet awake    Objective   Vital signs in last 24 hours: Temp:  [96 F (35.6 C)-97.8 F (36.6 C)] 97.7 F (36.5 C) (08/08 0800) Pulse Rate:  [60-98] 60 (08/08 0847) Resp:  [0-35] 11 (08/08 0900) BP: (76-131)/(47-101) 93/56 mmHg (08/08 0900) SpO2:  [100 %] 100 % (08/08 0900) FiO2 (%):  [30 %] 30 % (08/08 0847) Weight:  [229 lb 8 oz (104.1 kg)-232 lb 12.9 oz (105.6 kg)] 232 lb 12.9 oz (105.6 kg) (08/08 0500) Last BM Date: 02/05/15 General:    black male intubated and critically ill Lungs:  lungs CTA bilaterally Cor:  S1S2 no murmur Abdomen:  Soft, nontender and distended with ascites. Normal bowel sounds. Extremities:  ++ edema edema. \  Intake/Output from previous day: 08/07 0701 - 08/08 0700 In: 3032.6 [I.V.:392.6; Blood:640; IV Piggyback:1700] Out: 1070 [Urine:1070] Intake/Output this shift: Total I/O In: 172.1 [I.V.:72.1; IV Piggyback:100] Out: -   Lab Results:  Recent Labs  02/07/15 1430 02/07/15 2225 02/08/15 0433  WBC 6.8 6.5 7.7  HGB 6.7* 7.9* 8.1*  HCT 19.9* 22.5* 23.3*  PLT 76* 70* 74*   BMET  Recent Labs  02/07/15 0340 02/07/15 1430 02/08/15 0432  NA 139 140 141  K 2.9* 3.1* 3.2*  CL 109 113* 112*  CO2 GLUCOSE 107* 92 92  BUN 32* 34* 33*  CREATININE 2.36* 2.32* 1.96*  CALCIUM 7.6* 7.4* 7.8*   LFT  Recent Labs  02/08/15 0432  PROT 6.2*  ALBUMIN 2.5*  AST 37  ALT 13*  ALKPHOS 48  BILITOT 2.3*   PT/INR  Recent Labs  02/07/15 0340 02/08/15 0432  LABPROT 20.6* 20.9*  INR 1.77* 1.80*      Assessment / Plan:   77. 49 year old male with decompensated cirrhosis. Admitted with ascites, coagulopathy and GI bleed. EGD revealed GI junction ulcerations, probably from previous band ligation in [place]. He also had several clean based gastric ulcers, probably from NSAID use. On Rocephin for SBP prophylaxis. On BID PPI. He got Vit K and FFP for coagulopathy. No further  bleeding, hgb stable. Off Fentanyl, if doesn't wake up soon may need to start treatment for encephalopathy.   2. Anemia of acute blood loss. hgb up from 6.1 to 8.1 after 4 units of blood.   3. Intermittent upper body twitches / shakes. He is now off Fentanyl. Might this be ETOH withdrawal? I spoke with Critical Care PA - Pete, he may need something for possible withdrawals. This could be encephalopaty - Iva Boop, MD, [facility] recheck NH3 continue Tx w/ lactulose, may need Xifaxan   4. Ascites. He has 4 liter removed yesterday. Probably didn't take off more out of concern for kidneys. He still has tense ascites, not sure he could get off vent without another LVP. Anders Simmonds PA to evaluate for bedside paracentesis with IV albumin.   5 Prolonged QT today - CCM addressing   6. Renal failure, acute?      LOS: 2 days   Willette Cluster  02/08/2015, 10:13 AM   [facility] Attending  I have also seen and assessed the patient and agree with the advanced practitioner's assessment and plan. My Hx and PE same. Remains critically ill and on the vent - massive ascites and relative hypotension. Liver failure - aggressive supportive care is helping him.  Iva Boop, MD, [facility] [phone] (pager) 02/08/2015  5:42 PM

## 2015-02-08 NOTE — Progress Notes (Signed)
eLink Physician-Brief Progress Note Patient Name: Ezriel Boffa DOB: [birth year] MRN: [id number]   Date of Service  02/08/2015  HPI/Events of Note  Agitated and asynchronous on the vent.  eICU Interventions  Fentanyl drip ordered not to exceed 200 mcg/hr however.     Intervention Category Major Interventions: Other:  YACOUB,WESAM 02/08/2015, 11:43 PM

## 2015-02-08 NOTE — Progress Notes (Signed)
Fentanyl Drip stopped, wasted 190 ml in sink witnessed by Lezlie Lye RN / Caesar Chestnut RN

## 2015-02-08 NOTE — Progress Notes (Signed)
eLink Physician-Brief Progress Note Patient Name: Francisco [name] DOB: 06-Nov-1965 MRN: [id number]   Date of Service  02/08/2015  HPI/Events of Note  K+ = 3.2 and Creatinine = 1.96.  eICU Interventions  Will cautiously replete K+.      Intervention Category Intermediate Interventions: Electrolyte abnormality - evaluation and management  [name] 02/08/2015, 6:57 AM

## 2015-02-08 NOTE — Progress Notes (Signed)
Date:  February 08, 2015 U.R. performed for needs and level of care. Will continue to follow for Case Management needs.  Kyshaun Barnette, RN, BSN, CCM   [phone] 

## 2015-02-08 NOTE — Progress Notes (Signed)
eLink Physician-Brief Progress Note Patient Name: Francisco [name] DOB: 09-28-1965 MRN: [id number]   Date of Service  02/08/2015  HPI/Events of Note  Hypotension. BP = 78/48. CVP = 11.   eICU Interventions  Will order: 1. Phenylephrine IV infusion. Titrate to MAP >= 65.     Intervention Category Intermediate Interventions: Hypotension - evaluation and management  Maudy Yonan Eugene 02/08/2015, 3:15 AM

## 2015-02-08 NOTE — Progress Notes (Signed)
Initial Nutrition Assessment  DOCUMENTATION CODES:   Obesity unspecified  INTERVENTION:   If pt is to remain intubated for > 48 hours, consider nutrition support. TF recommendations: Initiate Vital HP @ 20 ml/hr and increase by 10 ml every 4 hours to goal rate of 40 ml/hr.   60 ml Prostat BID.    Tube feeding regimen provides 1360 kcal (100% of needs), 144 grams of protein, and 803 ml of H2O.   RD to continue to monitor   NUTRITION DIAGNOSIS:   Inadequate oral intake related to inability to eat as evidenced by NPO status.  GOAL:   Provide needs based on ASPEN/SCCM guidelines  MONITOR:   Vent status, Labs, Weight trends, Skin, I & O's  REASON FOR ASSESSMENT:   Ventilator    ASSESSMENT:   Patient admitted with worsening dyspnea and lower extremity edema. Recently discharged from SNF after long hospitalization at OSH in [place] with GIB from esophageal varices post banding.  Pt in room with no family at bedside. TF recommendations provided above if needed.  Patient is currently intubated on ventilator support MV: 9.9 L/min Temp (24hrs), Avg:96.9 F (36.1 C), Min:96 F (35.6 C), Max:97.8 F (36.6 C)  Propofol:none  Nutrition focused physical exam shows no sign of depletion of muscle mass or body fat.  Labs reviewed: Low K Elevated BUN & Creatinine Mg/Phos WNL  Diet Order:  Diet NPO time specified  Skin:  Reviewed, no issues  Last BM:  8/5  Height:   Ht Readings from Last 1 Encounters:  02/06/15  (1.778 m)    Weight:   Wt Readings from Last 1 Encounters:  02/08/15 232 lb 12.9 oz (105.6 kg)    Ideal Body Weight:  75.5 kg  BMI:  Body mass index is 33.4 kg/(m^2).  Estimated Nutritional Needs:   Kcal:  1161-1478 (11-14 kcal/kg)  Protein:  150-160g  Fluid:  1.5L/day  EDUCATION NEEDS:   No education needs identified at this time  Tilda Franco, MS, RD, LDN Pager: [phone] After Hours Pager: [phone]

## 2015-02-08 NOTE — Progress Notes (Addendum)
PULMONARY / CRITICAL CARE MEDICINE   Name: Francisco [name] MRN: [id number] DOB: 1965-12-31    ADMISSION DATE:  02/06/2015 CONSULTATION DATE:  02/06/15  REFERRING MD :  ED  CHIEF COMPLAINT:  Variceal Bleed & CHF  INITIAL PRESENTATION: Patient admitted with worsening dyspnea and lower extremity edema. Recently discharged from SNF after long hospitalization at OSH in [place] with GIB from esophageal varices post banding.  STUDIES:  Portable CXR 8/6 - Peribronchial cuffing. No consolidation or effusion.  SIGNIFICANT EVENTS: 8/6 - Admit to ICU 8/6 - Transfuse 1u PRBCs 8/6 - Elective intubation & Left IJ placed 8/6 - EGD w/o varices but w/ small peptic ulcers 8/7 paracentesis 3.8 liters.   SUBJECTIVE:    ROS:  Unobtainable as patient is intubated & sedated.  VITAL SIGNS: Temp:  [96 F (35.6 C)-97.8 F (36.6 C)] 97.6 F (36.4 C) (08/08 1036) Pulse Rate:  [60-98] 63 (08/08 1127) Resp:  [0-35] 15 (08/08 1127) BP: (76-131)/(47-101) 93/56 mmHg (08/08 1127) SpO2:  [100 %] 100 % (08/08 1127) FiO2 (%):  [30 %] 30 % (08/08 1127) Weight:  [104.1 kg (229 lb 8 oz)-105.6 kg (232 lb 12.9 oz)] 105.6 kg (232 lb 12.9 oz) (08/08 0500) HEMODYNAMICS: CVP:  [6 mmHg-16 mmHg] 14 mmHg VENTILATOR SETTINGS: Vent Mode:  [-] PRVC FiO2 (%):  [30 %] 30 % Set Rate:  [15 bmp] 15 bmp Vt Set:  [580 mL] 580 mL PEEP:  [5 cmH20] 5 cmH20 Plateau Pressure:  [20 cmH20-33 cmH20] 20 cmH20 INTAKE / OUTPUT:  Intake/Output Summary (Last 24 hours) at 02/08/15 1138 Last data filed at 02/08/15 4696  Gross per 24 hour  Intake 3165.41 ml  Output   1070 ml  Net 2095.41 ml    PHYSICAL EXAMINATION: General:  No distress. But triggering apnea  Neuro:  Sedated. PERRL. HEENT:  Tacky MM. ETT in place.  Cardiovascular:  Reg rate. Pitting lower extremity edema.  Lungs:  Mild crackles bilateral bases. Symmetric chest rise on vent.  Abdomen:  Protuberant. Fluid shift from ascites but still soft.  Musculoskeletal:  No joint  deformity or effusion.  Skin:  Warm & dry. No rash.  LABS:  CBC  Recent Labs Lab 02/07/15 1430 02/07/15 2225 02/08/15 0433  WBC 6.8 6.5 7.7  HGB 6.7* 7.9* 8.1*  HCT 19.9* 22.5* 23.3*  PLT 76* 70* 74*   Coag's  Recent Labs Lab 02/06/15 0930 02/07/15 0340 02/08/15 0432  APTT 45* 60* 45*  INR 1.87* 1.77* 1.80*   BMET  Recent Labs Lab 02/07/15 0340 02/07/15 1430 02/08/15 0432  NA 139 140 141  K 2.9* 3.1* 3.2*  CL 109 113* 112*  CO2 23 22 22   BUN 32* 34* 33*  CREATININE 2.36* 2.32* 1.96*  GLUCOSE 107* 92 92   Electrolytes  Recent Labs Lab 02/07/15 0340 02/07/15 1430 02/08/15 0432  CALCIUM 7.6* 7.4* 7.8*  MG 1.7  --  2.0  PHOS 4.2  --  3.4   Sepsis Markers  Recent Labs Lab 02/06/15 0944 02/06/15 1830 02/07/15 0340  LATICACIDVEN 2.99* 3.1* 1.9   ABG  Recent Labs Lab 02/06/15 0856 02/06/15 1843  PHART 7.450 7.419  PCO2ART 33.1* 34.2*  PO2ART 119* 478*   Liver Enzymes  Recent Labs Lab 02/06/15 0930 02/07/15 0340 02/08/15 0432  AST 49* 40 37  ALT 16* 16* 13*  ALKPHOS 75 58 48  BILITOT 1.8* 2.2* 2.3*  ALBUMIN 1.6* 1.8* 2.5*   Cardiac Enzymes No results for input(s): TROPONINI, PROBNP in the last 168 hours.  Glucose  Recent Labs Lab 02/07/15 0800 02/07/15 1619 02/07/15 2005 02/08/15 0022 02/08/15 0429 02/08/15 0737  GLUCAP 105* 93 89 73 92 98    Imaging US Renal  02/07/2015   CLINICAL DATA:  49 year old male with a history of acute renal failure, chronic renal failure, cirrhosis.  EXAM: RENAL / URINARY TRACT ULTRASOUND COMPLETE  COMPARISON:  None.  FINDINGS: Right Kidney:  Length: 12.3 cm. No evidence of right-sided hydronephrosis. Echogenicity similar to that of the visualized adjacent liver parenchyma. Flow confirmed in the hilum of the right kidney.  Left Kidney:  Length: 12.0 cm. Left kidney incompletely visualized, however, there does not appear to be significant hydronephrosis.  Moderate to large volume of ascites.   IMPRESSION: No evidence of left or right hydronephrosis to indicate obstruction.  Moderate to large volume of ascites.  Signed,  Yvone Neu. Loreta Ave, DO  [facility]  [facility]   Electronically Signed   By: Gilmer Mor D.O.   On: 02/07/2015 12:46   US Paracentesis  02/07/2015   CLINICAL DATA:  Abdominal distention. Alcoholic cirrhosis. Massive ascites. Request diagnostic and therapeutic paracentesis of up to 4 L max.  EXAM: ULTRASOUND GUIDED PARACENTESIS  COMPARISON:  None.  PROCEDURE: An ultrasound guided paracentesis was thoroughly discussed with the patient and questions answered. The benefits, risks, alternatives and complications were also discussed. The patient understands and wishes to proceed with the procedure. Written consent was obtained.  Ultrasound was performed to localize and mark an adequate pocket of fluid in the left lower quadrant of the abdomen. The area was then prepped and draped in the normal sterile fashion. 1% Lidocaine was used for local anesthesia. Under ultrasound guidance a 19 gauge Yueh catheter was introduced. Paracentesis was performed. The catheter was removed and a dressing applied.  COMPLICATIONS: None immediate  FINDINGS: A total of approximately 3.8 L of cloudy, yellow fluid was removed. A fluid sample was sent for laboratory analysis.  IMPRESSION: Successful ultrasound guided paracentesis yielding 3.8 L of ascites.  Read by: Brayton El PA-C   Electronically Signed   By: Gilmer Mor D.O.   On: 02/07/2015 12:08   Dg Chest Port 1 View  02/07/2015   CLINICAL DATA:  Withdrawal of central line  EXAM: PORTABLE CHEST - 1 VIEW  COMPARISON:  Portable exam 1348 hours compared to 02/06/2015  FINDINGS: LEFT jugular central venous catheter with tip projecting over cavoatrial junction.  Minimal enlargement of cardiac silhouette.  Pulmonary vascular congestion.  Perihilar infiltrates question edema versus infection.  Low lung volumes.  No  pleural effusion or pneumothorax.  Tip of endotracheal tube at carina, recommend withdrawal 2 cm.  IMPRESSION: Tip of LEFT jugular 9 not projects over cavoatrial junction.  Tip of endotracheal tube now at carina, recommend withdrawal 2 cm.  Perihilar infiltrates question edema versus infection.  Critical Value/emergent results were called by telephone at the time of interpretation on 02/07/2015 at 1403 hr to Onalee Hua RN (in ICU Stepdown), who verbally acknowledged these results.   Electronically Signed   By: Ulyses Southward M.D.   On: 02/07/2015 14:04     ASSESSMENT / PLAN:  PULMONARY OETT 8/6>> A: Acute Hypoxic Respiratory Failure - No evidence of CHF on CXR. >low volume cxr  P:   Wean FiO2 for Sat >92%. Pulmonary toilet with IS May need repeat paracentesis prior to dc  CARDIOVASCULAR L IJ CVC 8/6>> A:  Grade I diastolic dysfunction  Hypotension-->likely d/t sedation.  P:  Cont IV hydration  Cont tele Wean neo for MAP > 65   RENAL A:   CKD - unknown baseline creatinine. Previously on HD.-->improving; renal US neg on 8/7 Lactic Acidosis - Resolving Hypokalemia  Hyperchloremia  P:   Monitor UOP Maintain MAP>65 Trend daily BUN/Creatinine Monitor electrolytes daily with labs   GASTROINTESTINAL A:   GIB - has been taking Aleve H/O Variceal Bleed - s/p OSH banding June 2016. EGD w/o varices Liver Cirrhosis Ascites Peptic Ulcers > Hgb stable after transfusion.  P:   Resume lactulose  NPO GI following Rocephin for SBP prophylaxis Trending Coags, Fibrinogen & Hgb/Hct Protonix IV bid   HEMATOLOGIC A:   Anemia - Secondary to GIB vs volume overload Coagulopathy - Secondary to cirrhosis Thrombocytopenia - Mild & stable  P:  Repeat Vit K today (8/8) Trending Coags Transfuse for Hgb <7.0 or active bleeding SCDs for DVT prophylaxis  INFECTIOUS A:   Ascites  P:   Trending leukocyte cough with labs daily  BCx2 8/6>> Ascites 8/8>>> Abx:  Rocephin, start date 8/6,  day 3/x  ENDOCRINE A:   No acute issues.  P:   Accuchecks q4hr MD to be notified for BG <90 or >160  NEUROLOGIC A:   Acute encephalopathy; suspect that this is primarily d/t fentanyl  P:   RASS goal: 0 to -1 Dc fentanyl gtt; change to PRN  resume Lactulose Add thiamine and folate   FAMILY  - Updates: No family at bedside for update.  - Inter-disciplinary family meet or Palliative Care meeting due by:  8/13   TODAY'S SUMMARY: hgb stable. Not able to wean d/t sedation. Will d/c fent gtt, add lactulose and cont to re-assess for readiness to wean. Hope that he will be able to extubate as soon as his MS clears.    02/08/2015, 11:38 AM

## 2015-02-08 NOTE — Progress Notes (Signed)
E-Link MD made aware of patient's QT/QTC prolongation, new orders received.

## 2015-02-08 NOTE — Progress Notes (Addendum)
eLink Physician-Brief Progress Note Patient Name: Francisco [name] DOB: [birth year] MRN: [id number]   Date of Service  02/08/2015  HPI/Events of Note  Called d/t QTc interval = 0.62 seconds. Baseline EKG = 0.46 seconds. Patient is currently on Zofran and Propofol.  eICU Interventions  Will order: 1. D/C Zofran. 2. Fentanyl IV infusion. Titrate to RASS = -1 to -2. 3. Wean Propofol IV infusion off when Fentanyl IV infusion is running.      Intervention Category Major Interventions: Arrhythmia - evaluation and management  Sommer,Steven Eugene 02/08/2015, 12:46 AM

## 2015-02-08 NOTE — Progress Notes (Signed)
ANTIBIOTIC CONSULT NOTE - INITIAL  Pharmacy Consult for Vancomycin  Indication: Sepsis  No Known Allergies  Patient Measurements: Height: 5\' 10"  (177.8 cm) Weight: 232 lb 12.9 oz (105.6 kg) IBW/kg (Calculated) : 73 Adjusted Body Weight:   Vital Signs: Temp: 97.6 F (36.4 C) (08/08 1036) Temp Source: Oral (08/08 1036) BP: 90/56 mmHg (08/08 1415) Pulse Rate: 63 (08/08 1127) Intake/Output from previous day: 08/07 0701 - 08/08 0700 In: 3032.6 [I.V.:392.6; Blood:640; IV Piggyback:1700] Out: 1070 [Urine:1070] Intake/Output from this shift: Total I/O In: 431.2 [I.V.:281.2; IV Piggyback:150] Out: -   Labs:  Recent Labs  02/07/15 0337 02/07/15 0340 02/07/15 1430 02/07/15 2225 02/08/15 0432 02/08/15 0433  WBC  --  8.7 6.8 6.5  --  7.7  HGB  --  6.4* 6.7* 7.9*  --  8.1*  PLT  --  80* 76* 70*  --  74*  LABCREA 226.43  --   --   --   --   --   CREATININE  --  2.36* 2.32*  --  1.96*  --    Estimated Creatinine Clearance: 54.2 mL/min (by C-G formula based on Cr of 1.96). No results for input(s): VANCOTROUGH, VANCOPEAK, VANCORANDOM, GENTTROUGH, GENTPEAK, GENTRANDOM, TOBRATROUGH, TOBRAPEAK, TOBRARND, AMIKACINPEAK, AMIKACINTROU, AMIKACIN in the last 72 hours.   Microbiology: Recent Results (from the past 720 hour(s))  Blood culture (routine x 2)     Status: None (Preliminary result)   Collection Time: 02/06/15 11:41 AM  Result Value Ref Range Status   Specimen Description BLOOD LEFT HAND  Final   Special Requests IN PEDIATRIC BOTTLE 2CC  Final   Culture   Final    NO GROWTH 2 DAYS Performed at [facility]    Report Status PENDING  Incomplete  MRSA PCR Screening     Status: None   Collection Time: 02/06/15  1:00 PM  Result Value Ref Range Status   MRSA by PCR NEGATIVE NEGATIVE Final    Comment:        The GeneXpert MRSA Assay (FDA approved for NASAL specimens only), is one component of a comprehensive MRSA colonization surveillance program. It is not intended  to diagnose MRSA infection nor to guide or monitor treatment for MRSA infections.   Blood culture (routine x 2)     Status: None (Preliminary result)   Collection Time: 02/06/15  6:40 PM  Result Value Ref Range Status   Specimen Description BLOOD LEFT HAND  6 ML IN AEROBIC ONLY  Final   Special Requests NONE  Final   Culture   Final    NO GROWTH 1 DAY Performed at [facility]    Report Status PENDING  Incomplete  Culture, body fluid-bottle     Status: None (Preliminary result)   Collection Time: 02/07/15 12:46 PM  Result Value Ref Range Status   Specimen Description PERITONEAL  Final   Special Requests NONE  Final   Gram Stain   Final    GRAM POSITIVE COCCI IN CLUSTERS GRAM VARIABLE ROD ANAEROBIC BOTTLE ONLY CRITICAL RESULT CALLED TO, READ BACK BY AND VERIFIED WITH: R JOHNSON,RN AT 1446 02/08/15 BY L BENFIELD    Culture   Final    NO GROWTH < 24 HOURS Performed at [facility]    Report Status PENDING  Incomplete  Gram stain     Status: None   Collection Time: 02/07/15 12:46 PM  Result Value Ref Range Status   Specimen Description PERITONEAL  Final   Special Requests NONE  Final   Gram Stain   Final    CYTOSPIN SLIDE WBC PRESENT,BOTH PMN AND MONONUCLEAR NO ORGANISMS SEEN Performed at [facility]    Report Status 02/07/2015 FINAL  Final    Medical History: Past Medical History  Diagnosis Date  . Hepatic cirrhosis   . Esophageal varices     last banding in [place] June 2016  . Chronic renal failure     was on hemodialysis in June 2016  . Congestive heart failure     questionable hx & on lasix  . Ascites    Assessment: 36 yoM with PMHx hepatic cirrhosis, CKD previously on HD in June '16, and CHF with ascites admitted to [facility] on 02/06/15 with worsening dyspnea and lower extremity edema.  Pt recently d/c'd from SNF after long hospitalization at OSH from GIB with esophageal varices post banding. Pt currently intubated and sedated and s/p  paracentesis on 8/7. Pt more hypotensive overnight and s/p volume resuscitation and now peritoneal fluid growing GPC in clusters and gram variable rod.  Pharmacy consulted to start vancomycin for sepsis .   Anti-infectives 8/6 >> Ceftriaxone  >> 8/8 >> Vancomycin  >>    Vitals/Labs WBC: WNL Tm24h: Low 96.2  SCr: Improving, 1.96 (baseline unknown, noted previous need for HD in June '16), CrCl 54 (N45)  Cultures 8/6 bloodx2: NGTD 8/7 peritoneal fluid: GPC in clusters, gram variable rod  Goal of Therapy:  Vancomycin trough level 15-20 mcg/ml  Plan:  Vancomycin  IV x 1, then  q24h F/u renal function closely, check VT as Css as warranted F/u cultures, clinical course  Haynes Hoehn, PharmD, BCPS 02/08/2015, 3:32 PM  Pager: [phone]

## 2015-02-08 NOTE — Progress Notes (Deleted)
    Progress Note   Subjective  intubated   Objective   Vital signs in last 24 hours: Temp:  [96 F (35.6 C)-97.8 F (36.6 C)] 97.6 F (36.4 C) (08/08 1036) Pulse Rate:  [60-98] 60 (08/08 0847) Resp:  [0-35] 15 (08/08 1036) BP: (76-131)/(47-101) 94/60 mmHg (08/08 1000) SpO2:  [100 %] 100 % (08/08 1036) FiO2 (%):  [30 %] 30 % (08/08 0847) Weight:  [229 lb 8 oz (104.1 kg)-232 lb 12.9 oz (105.6 kg)] 232 lb 12.9 oz (105.6 kg) (08/08 0500) Last BM Date: 02/05/15 General:    Black male in NAD Abdomen:  Soft, distended, a few bowel sounds Extremities:  Without edema. Neurologic:  Sedated. Some upper body twitching   Intake/Output from previous day: 08/07 0701 - 08/08 0700 In: 3032.6 [I.V.:392.6; Blood:640; IV Piggyback:1700] Out: 1070 [Urine:1070] Intake/Output this shift: Total I/O In: 172.1 [I.V.:72.1; IV Piggyback:100] Out: -   Lab Results:  Recent Labs  02/07/15 1430 02/07/15 2225 02/08/15 0433  WBC 6.8 6.5 7.7  HGB 6.7* 7.9* 8.1*  HCT 19.9* 22.5* 23.3*  PLT 76* 70* 74*   BMET  Recent Labs  02/07/15 0340 02/07/15 1430 02/08/15 0432  NA 139 140 141  K 2.9* 3.1* 3.2*  CL 109 113* 112*  CO2 GLUCOSE 107* 92 92  BUN 32* 34* 33*  CREATININE 2.36* 2.32* 1.96*  CALCIUM 7.6* 7.4* 7.8*   LFT  Recent Labs  02/08/15 0432  PROT 6.2*  ALBUMIN 2.5*  AST 37  ALT 13*  ALKPHOS 48  BILITOT 2.3*   PT/INR  Recent Labs  02/07/15 0340 02/08/15 0432  LABPROT 20.6* 20.9*  INR 1.77* 1.80*      Assessment / Plan:   28. 49 year old male with decompensated cirrhosis manifested by ascites, coagulopathy. He presented with GI bleed,   Active Problems:   GI bleeding   Acute upper GI bleed   Cirrhosis   Acute blood loss anemia     LOS: 2 days   Willette Cluster  02/08/2015, 10:43 AM

## 2015-02-09 ENCOUNTER — Inpatient Hospital Stay (HOSPITAL_COMMUNITY): Payer: Medicaid - Out of State

## 2015-02-09 DIAGNOSIS — K7460 Unspecified cirrhosis of liver: Secondary | ICD-10-CM

## 2015-02-09 DIAGNOSIS — K7290 Hepatic failure, unspecified without coma: Secondary | ICD-10-CM | POA: Diagnosis present

## 2015-02-09 DIAGNOSIS — K7682 Hepatic encephalopathy: Secondary | ICD-10-CM | POA: Diagnosis present

## 2015-02-09 DIAGNOSIS — D62 Acute posthemorrhagic anemia: Secondary | ICD-10-CM

## 2015-02-09 LAB — PHOSPHORUS: Phosphorus: 3.9 mg/dL (ref 2.5–4.6)

## 2015-02-09 LAB — COMPREHENSIVE METABOLIC PANEL
ALT: 16 U/L — ABNORMAL LOW (ref 17–63)
AST: 51 U/L — ABNORMAL HIGH (ref 15–41)
Albumin: 2.5 g/dL — ABNORMAL LOW (ref 3.5–5.0)
Alkaline Phosphatase: 58 U/L (ref 38–126)
Anion gap: 5 (ref 5–15)
BILIRUBIN TOTAL: 2.4 mg/dL — AB (ref 0.3–1.2)
BUN: 26 mg/dL — ABNORMAL HIGH (ref 6–20)
CO2: 22 mmol/L (ref 22–32)
Calcium: 8 mg/dL — ABNORMAL LOW (ref 8.9–10.3)
Chloride: 114 mmol/L — ABNORMAL HIGH (ref 101–111)
Creatinine, Ser: 1.58 mg/dL — ABNORMAL HIGH (ref 0.61–1.24)
GFR calc non Af Amer: 49 mL/min — ABNORMAL LOW (ref >60)
GFR, EST AFRICAN AMERICAN: 57 mL/min — AB (ref >60)
Glucose, Bld: 119 mg/dL — ABNORMAL HIGH (ref 65–99)
Potassium: 3.3 mmol/L — ABNORMAL LOW (ref 3.5–5.1)
Sodium: 141 mmol/L (ref 135–145)
Total Protein: 7 g/dL (ref 6.5–8.1)

## 2015-02-09 LAB — GLUCOSE, CAPILLARY
GLUCOSE-CAPILLARY: 103 mg/dL — AB (ref 65–99)
GLUCOSE-CAPILLARY: 118 mg/dL — AB (ref 65–99)
Glucose-Capillary: 121 mg/dL — ABNORMAL HIGH (ref 65–99)
Glucose-Capillary: 100 mg/dL — ABNORMAL HIGH (ref 65–99)
Glucose-Capillary: 106 mg/dL — ABNORMAL HIGH (ref 65–99)
Glucose-Capillary: 121 mg/dL — ABNORMAL HIGH (ref 65–99)

## 2015-02-09 LAB — MAGNESIUM: MAGNESIUM: 1.8 mg/dL (ref 1.7–2.4)

## 2015-02-09 LAB — CBC
HCT: 28.1 % — ABNORMAL LOW (ref 39.0–52.0)
HCT: 25.2 % — ABNORMAL LOW (ref 39.0–52.0)
HEMOGLOBIN: 8.2 g/dL — AB (ref 13.0–17.0)
Hemoglobin: 9.2 g/dL — ABNORMAL LOW (ref 13.0–17.0)
MCH: 28 pg (ref 26.0–34.0)
MCH: 27.9 pg (ref 26.0–34.0)
MCHC: 32.7 g/dL (ref 30.0–36.0)
MCHC: 32.5 g/dL (ref 30.0–36.0)
MCV: 85.4 fL (ref 78.0–100.0)
MCV: 85.7 fL (ref 78.0–100.0)
PLATELETS: 89 10*3/uL — AB (ref 150–400)
Platelets: 71 10*3/uL — ABNORMAL LOW (ref 150–400)
RBC: 3.29 MIL/uL — AB (ref 4.22–5.81)
RBC: 2.94 MIL/uL — AB (ref 4.22–5.81)
RDW: 16.6 % — ABNORMAL HIGH (ref 11.5–15.5)
RDW: 16.7 % — ABNORMAL HIGH (ref 11.5–15.5)
WBC: 9.6 10*3/uL (ref 4.0–10.5)
WBC: 8 10*3/uL (ref 4.0–10.5)

## 2015-02-09 LAB — PROTIME-INR
INR: 1.72 — ABNORMAL HIGH (ref 0.00–1.49)
Prothrombin Time: 20.2 seconds — ABNORMAL HIGH (ref 11.6–15.2)

## 2015-02-09 LAB — FIBRINOGEN: Fibrinogen: 124 mg/dL — ABNORMAL LOW (ref 204–475)

## 2015-02-09 LAB — APTT: aPTT: 44 seconds — ABNORMAL HIGH (ref 24–37)

## 2015-02-09 MED ORDER — FENTANYL CITRATE (PF) 100 MCG/2ML IJ SOLN
50.0000 ug | INTRAMUSCULAR | Status: DC | PRN
  Administered 2015-02-09 – 2015-02-10 (×5): 100 ug via INTRAVENOUS
  Administered 2015-02-11: 50 ug via INTRAVENOUS
  Administered 2015-02-11: 100 ug via INTRAVENOUS
  Administered 2015-02-11: 50 ug via INTRAVENOUS
  Administered 2015-02-11 – 2015-02-15 (×3): 100 ug via INTRAVENOUS
  Administered 2015-02-15: 25 ug via INTRAVENOUS
  Filled 2015-02-09 (×14): qty 2

## 2015-02-09 MED ORDER — POTASSIUM CHLORIDE 10 MEQ/50ML IV SOLN
10.0000 meq | INTRAVENOUS | Status: AC
  Administered 2015-02-09 (×4): 10 meq via INTRAVENOUS
  Filled 2015-02-09: qty 50

## 2015-02-09 MED ORDER — LORAZEPAM 2 MG/ML IJ SOLN
1.0000 mg | INTRAMUSCULAR | Status: DC | PRN
  Administered 2015-02-11 (×3): 2 mg via INTRAVENOUS
  Filled 2015-02-09 (×4): qty 1

## 2015-02-09 MED ORDER — DEXMEDETOMIDINE HCL IN NACL 400 MCG/100ML IV SOLN
0.4000 ug/kg/h | INTRAVENOUS | Status: DC
  Administered 2015-02-09 (×2): 0.4 ug/kg/h via INTRAVENOUS
  Filled 2015-02-09: qty 50
  Filled 2015-02-09: qty 100

## 2015-02-09 MED ORDER — LACTULOSE 10 GM/15ML PO SOLN
30.0000 g | Freq: Four times a day (QID) | ORAL | Status: DC
  Administered 2015-02-09 (×3): 30 g
  Filled 2015-02-09 (×3): qty 45

## 2015-02-09 MED ORDER — MIDAZOLAM HCL 2 MG/2ML IJ SOLN
1.0000 mg | INTRAMUSCULAR | Status: DC | PRN
  Administered 2015-02-09 (×2): 2 mg via INTRAVENOUS
  Filled 2015-02-09 (×2): qty 2

## 2015-02-09 MED ORDER — CHLORHEXIDINE GLUCONATE 0.12 % MT SOLN
OROMUCOSAL | Status: AC
  Filled 2015-02-09: qty 15

## 2015-02-09 MED ORDER — RIFAXIMIN 550 MG PO TABS
550.0000 mg | ORAL_TABLET | Freq: Two times a day (BID) | ORAL | Status: DC
  Administered 2015-02-09 (×2): 550 mg
  Filled 2015-02-09 (×4): qty 1

## 2015-02-09 NOTE — Progress Notes (Addendum)
E link notified about patient remaining dyssynchronous with the ventilator, extremely agitated, climbing out of bed, pulling at tubes and lines despite completing the second order of fentanyl pushes q15 minutes X3. Patient has now received 700 mcg of fentanyl in last few hours which has been ineffective. Fentanyl gtt ordered.

## 2015-02-09 NOTE — Progress Notes (Signed)
PULMONARY / CRITICAL CARE MEDICINE   Name: Francisco [name] MRN: [id number] DOB: [birth year]    ADMISSION DATE:  02/06/2015 CONSULTATION DATE:  02/06/15  REFERRING MD :  ED  CHIEF COMPLAINT:  Variceal Bleed & CHF  INITIAL PRESENTATION: Patient admitted with worsening dyspnea and lower extremity edema. Recently discharged from SNF after long hospitalization at OSH in [place] with GIB from esophageal varices post banding.  STUDIES:  Portable CXR 8/6 - Peribronchial cuffing. No consolidation or effusion.  SIGNIFICANT EVENTS: 8/6 - Admit to ICU 8/6 - Transfuse 1u PRBCs 8/6 - Elective intubation & Left IJ placed 8/6 - EGD w/o varices but w/ small peptic ulcers 8/7 paracentesis 3.8 liters.   SUBJECTIVE:  Patient more agitated overnight and pulled out central line partially. More sedated this morning and not following commands.  ROS:  Unobtainable as patient is intubated & sedated.  VITAL SIGNS: Temp:  [97.3 F (36.3 C)-98.3 F (36.8 C)] 98.3 F (36.8 C) (08/09 0800) Pulse Rate:  [63-100] 95 (08/09 0820) Resp:  [9-30] 13 (08/09 1000) BP: (80-130)/(41-86) 102/55 mmHg (08/09 1000) SpO2:  [100 %] 100 % (08/09 1000) FiO2 (%):  [30 %] 30 % (08/09 0820) Weight:  [233 lb 0.4 oz (105.7 kg)] 233 lb 0.4 oz (105.7 kg) (08/09 0421) HEMODYNAMICS: CVP:  [11 mmHg-25 mmHg] 16 mmHg VENTILATOR SETTINGS: Vent Mode:  [-] PRVC FiO2 (%):  [30 %] 30 % Set Rate:  [15 bmp] 15 bmp Vt Set:  [580 mL] 580 mL PEEP:  [5 cmH20] 5 cmH20 Plateau Pressure:  [17 cmH20-30 cmH20] 29 cmH20 INTAKE / OUTPUT:  Intake/Output Summary (Last 24 hours) at 02/09/15 1031 Last data filed at 02/09/15 1000  Gross per 24 hour  Intake 3229.95 ml  Output    895 ml  Net 2334.95 ml    PHYSICAL EXAMINATION: General:  Resting with eyes closed in bed. No distress. Neuro:  Sedated. Spontaneously moves all 4 extremities. Not following commands. HEENT:  Tacky MM. ETT in place.  Cardiovascular:  Reg rate. Lower extremity edema  persists. Lungs:  Decreased aeration bilateral bases. Continuing on Vent. Abdomen:  Protuberant & more distended today. Bowel sounds present. Musculoskeletal:  No joint deformity or effusion.  Skin:  Warm & dry. No rash.  LABS:  CBC  Recent Labs Lab 02/08/15 0433 02/08/15 1700 02/09/15 0550  WBC 7.7 8.7 9.6  HGB 8.1* 8.9* 9.2*  HCT 23.3* 25.5* 28.1*  PLT 74* 82* 89*   Coag's  Recent Labs Lab 02/07/15 0340 02/08/15 0432 02/09/15 0550  APTT 60* 45* 44*  INR 1.77* 1.80* 1.72*   BMET  Recent Labs Lab 02/07/15 1430 02/08/15 0432 02/09/15 0550  NA 140 141 141  K 3.1* 3.2* 3.3*  CL 113* 112* 114*  CO2 BUN 34* 33* 26*  CREATININE 2.32* 1.96* 1.58*  GLUCOSE 92 92 119*   Electrolytes  Recent Labs Lab 02/07/15 0340 02/07/15 1430 02/08/15 0432 02/09/15 0550  CALCIUM 7.6* 7.4* 7.8* 8.0*  MG 1.7  --  2.0 1.8  PHOS 4.2  --  3.4 3.9   Sepsis Markers  Recent Labs Lab 02/06/15 0944 02/06/15 1830 02/07/15 0340  LATICACIDVEN 2.99* 3.1* 1.9   ABG  Recent Labs Lab 02/06/15 0856 02/06/15 1843  PHART 7.450 7.419  PCO2ART 33.1* 34.2*  PO2ART 119* 478*   Liver Enzymes  Recent Labs Lab 02/07/15 0340 02/08/15 0432 02/09/15 0550  AST 40 37 51*  ALT 16* 13* 16*  ALKPHOS 58 48 58  BILITOT 2.2*  2.3* 2.4*  ALBUMIN 1.8* 2.5* 2.5*   Cardiac Enzymes No results for input(s): TROPONINI, PROBNP in the last 168 hours. Glucose  Recent Labs Lab 02/08/15 1119 02/08/15 1602 02/08/15 2037 02/09/15 0007 02/09/15 0349 02/09/15 0842  GLUCAP 95 112* 100* 103* 121* 100*    Imaging Dg Abd 1 View  02/08/2015   CLINICAL DATA:  Orogastric tube placement.  EXAM: ABDOMEN - 1 VIEW  COMPARISON:  None.  FINDINGS: The bowel gas pattern is normal. Distal tip of feeding tube is seen in expected position of proximal stomach. No radio-opaque calculi or other significant radiographic abnormality are seen.  IMPRESSION: Distal tip of feeding tube seen in proximal  stomach.   Electronically Signed   By: Lupita Raider, M.D.   On: 02/08/2015 13:34   Dg Chest Port 1 View  02/09/2015   CLINICAL DATA:  Central line repositioning  EXAM: PORTABLE CHEST - 1 VIEW  COMPARISON:  February 07, 2015  FINDINGS: Central catheter tip is at the junction of the left innominate vein and superior vena cava. Endotracheal tube tip is 2.5 cm above carina. Nasogastric tube tip and side port are in the stomach. No pneumothorax. There is no edema or consolidation. Heart is upper normal in size with pulmonary vascularity within normal limits. No adenopathy. There is an apparent Hill-Sachs defect in the left humeral head. There is arthropathy in each shoulder.  IMPRESSION: Tube and catheter positions as described without pneumothorax. No edema or consolidation.   Electronically Signed   By: Bretta Bang III M.D.   On: 02/09/2015 02:41     ASSESSMENT / PLAN:  PULMONARY OETT 8/6>> A: Intubated for upper endoscopy electively - Mental status prohibits extubation. Low inspiratory volumes  P:   Wean FiO2 for Sat >92%. May need repeat paracentesis prior to extubation Plan for extubation once mental status improves  CARDIOVASCULAR L IJ CVC 8/6>> A:  Grade I diastolic dysfunction  Hypotension - Secondary to sedation.  P:  Cont IV hydration  Cont tele Wean Neo for MAP > 65   RENAL A:   Acute on Chronic RF - Unknown baseline creatinine. Previously on HD.Improving. Lactic Acidosis - Resolving Hypokalemia - Replacing IV Hyperchloremia - persists  P:   Monitor UOP Maintain MAP>65 Trend daily BUN/Creatinine Monitor electrolytes daily with labs KCl total IV   GASTROINTESTINAL A:   GIB - Secondary to peptic ulcers. Has been taking Aleve. H/O Variceal Bleed - s/p OSH banding June 2016. EGD w/o varices Liver Cirrhosis w/ ascites SBP  P:   Checking KUB Continue lactulose  GI following See ID for SBP Trending Coags, Fibrinogen & Hgb/Hct Protonix IV  bid   HEMATOLOGIC A:   Anemia - Secondary to GIB. Stable. No signs of active bleeding. Coagulopathy - Secondary to cirrhosis Thrombocytopenia - Mild & improving  P:  Trending Coags daily Transfuse for Hgb <7.0 or active bleeding SCDs for DVT prophylaxis No chemical DVT prophylaxis given GIB  INFECTIOUS A:   SBP  P:   Trending leukocyte count daily  BCx2 8/6>> Ascites 8/8>>>GPC & GVR  Abx:  Rocephin, start date 8/6, day 4/x Vancomycin, start date 8/8, day 1/x  ENDOCRINE A:   No acute issues.  P:   Accuchecks q4hr MD to be notified for BG <90 or >160  NEUROLOGIC A:   Acute encephalopathy - Hepatic vs Medication Induced  P:   RASS goal: 0 to -1 D/C fentanyl gtt; change to PRN  Lactulose via OGT Continue Thiamine IV  daily  FAMILY  - Updates: No family at bedside for update.  - Inter-disciplinary family meet or Palliative Care meeting due by:  8/13   TODAY'S SUMMARY: Patient continues to have no signs of active bleeding. Does have worsening mental status that could be secondary to hepatic encephalopathy or simply from medication. Starting Precedex to spare him narcotics & benzos. Renal function is improving. Checking KUB given increased abdominal distension since yesterday and receiving Lactulose via his OGT.  I have spent a total of 36 minutes of critical care time today caring for this patient and reviewing his EMR.  Donna Christen Jamison Neighbor, M.D. [facility] Pager:  [phone] After 3pm or if no response, call [phone] 02/09/2015, 10:31 AM

## 2015-02-09 NOTE — Progress Notes (Signed)
Patient extremely agitated, dsynchronous with the vent, trying to climb out of bed, pulling at lines and tubes. PRN 100 mcg fentanyl given x3 to maintain RASS goal is ineffective. Elink notified X2 about patient condition. MD reordered 100 mcg fentanyl q85min  x3 to maintain RASS goal. Will continue to monitor.

## 2015-02-09 NOTE — Progress Notes (Signed)
E link notified due to patient remaining dyssynchronous with ventilator, extremely agitated, climbing out of bed, pulling at tubes and lines despite being maxed out on current parameter order for fentanyl at 267mcg/hr. Patient sitting on side of bed currently. Patient has dislodged central line and is trying to pull out ETT. Restraints ordered.  Versed prn ordered.

## 2015-02-09 NOTE — Progress Notes (Signed)
Per Elink central line dislodged but okay to use.

## 2015-02-09 NOTE — Progress Notes (Signed)
    Progress Note   Subjective  sedated on vent   Objective   Vital signs in last 24 hours: Temp:  [97.3 F (36.3 C)-98.3 F (36.8 C)] 98.3 F (36.8 C) (08/09 0800) Pulse Rate:  [63-100] 95 (08/09 0820) Resp:  [9-30] 13 (08/09 1000) BP: (80-130)/(41-86) 102/55 mmHg (08/09 1000) SpO2:  [100 %] 100 % (08/09 1000) FiO2 (%):  [30 %] 30 % (08/09 0820) Weight:  [233 lb 0.4 oz (105.7 kg)] 233 lb 0.4 oz (105.7 kg) (08/09 0421) Last BM Date: 02/05/15 General:    Black male sedated on vent - critically ill Eyes:  Icteric with PERRL  Heart:  Regular rate and rhythm Abdomen:  Soft,largely distended. Gurgling, slightly high pitched bowel sounds. Abdominal wall edema. . Extremities:  BLE pitting edema. Neuro: Involuntary movements and twitching, eyelid movement   Intake/Output from previous day: 08/08 0701 - 08/09 0700 In: 3219.6 [I.V.:2409.6; NG/GT:160; IV Piggyback:650] Out: 800 [Urine:800] Intake/Output this shift: Total I/O In: 272.5 [I.V.:272.5] Out: 95 [Urine:95]  Lab Results:  Recent Labs  02/08/15 0433 02/08/15 1700 02/09/15 0550  WBC 7.7 8.7 9.6  HGB 8.1* 8.9* 9.2*  HCT 23.3* 25.5* 28.1*  PLT 74* 82* 89*   BMET  Recent Labs  02/07/15 1430 02/08/15 0432 02/09/15 0550  NA 140 141 141  K 3.1* 3.2* 3.3*  CL 113* 112* 114*  CO2 GLUCOSE 92 92 119*  BUN 34* 33* 26*  CREATININE 2.32* 1.96* 1.58*  CALCIUM 7.4* 7.8* 8.0*   LFT  Recent Labs  02/09/15 0550  PROT 7.0  ALBUMIN 2.5*  AST 51*  ALT 16*  ALKPHOS 58  BILITOT 2.4*   PT/INR  Recent Labs  02/08/15 0432 02/09/15 0550  LABPROT 20.9* 20.2*  INR 1.80* 1.72*      Assessment / Plan:     1. Decompensated cirrhosis. Admitted with ascites, coagulopathy and GI bleed with acute blood loss anemia. EGD revealed GE junction ulcerations, probably from previous band ligation of varices in [place]. Hgb stable. On BID PPI.  He had LVP with 4 liters removed 2 days ago. 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 Was already on Rocephin, CCM added Vanco yesterday. He may need a second LVP to help breathing. Renal function improving so should be able to take off more than 4 liters this time.  Remains critically ill and on ventilator.  2. Altered mental status. Agitated last night after weaning of Fentanyl. Had to resume Fentanyl but inadequate so prn Versed ordered also. Agitation could have been due in part to encephalopathy. Lactulose just started yesterday. Since trying to get him off ventilator will add xifaxan. Suspect hepatic encephalopathy - NH3 check in AM and also increase lactulose to 30 cc qid  3> + GPC clusters and G+ rods on ascites gramstain - ? Contaminant as only 44 WBC on cell count - covered w/ Abx   LOS: 3 days   Willette Cluster  02/09/2015, 10:40 AM   [facility] Attending  I have also seen and assessed the patient and agree with the advanced practitioner's assessment and plan. My hx and PE same plus added to above.  Iva Boop, MD, [facility] [phone] (pager) 02/09/2015 11:55 AM

## 2015-02-10 LAB — GLUCOSE, CAPILLARY
GLUCOSE-CAPILLARY: 127 mg/dL — AB (ref 65–99)
GLUCOSE-CAPILLARY: 117 mg/dL — AB (ref 65–99)
GLUCOSE-CAPILLARY: 139 mg/dL — AB (ref 65–99)
GLUCOSE-CAPILLARY: 102 mg/dL — AB (ref 65–99)
Glucose-Capillary: 125 mg/dL — ABNORMAL HIGH (ref 65–99)
Glucose-Capillary: 108 mg/dL — ABNORMAL HIGH (ref 65–99)

## 2015-02-10 LAB — CBC
HCT: 27.1 % — ABNORMAL LOW (ref 39.0–52.0)
HCT: 28.8 % — ABNORMAL LOW (ref 39.0–52.0)
Hemoglobin: 8.9 g/dL — ABNORMAL LOW (ref 13.0–17.0)
Hemoglobin: 9.6 g/dL — ABNORMAL LOW (ref 13.0–17.0)
MCH: 28 pg (ref 26.0–34.0)
MCH: 28.9 pg (ref 26.0–34.0)
MCHC: 32.8 g/dL (ref 30.0–36.0)
MCHC: 33.3 g/dL (ref 30.0–36.0)
MCV: 85.2 fL (ref 78.0–100.0)
MCV: 86.7 fL (ref 78.0–100.0)
PLATELETS: 72 10*3/uL — AB (ref 150–400)
PLATELETS: 86 10*3/uL — AB (ref 150–400)
RBC: 3.18 MIL/uL — AB (ref 4.22–5.81)
RBC: 3.32 MIL/uL — AB (ref 4.22–5.81)
RDW: 16.8 % — ABNORMAL HIGH (ref 11.5–15.5)
RDW: 17.3 % — ABNORMAL HIGH (ref 11.5–15.5)
WBC: 7.3 10*3/uL (ref 4.0–10.5)
WBC: 11.8 10*3/uL — AB (ref 4.0–10.5)

## 2015-02-10 LAB — COMPREHENSIVE METABOLIC PANEL
ALT: 17 U/L (ref 17–63)
AST: 51 U/L — AB (ref 15–41)
Albumin: 2.1 g/dL — ABNORMAL LOW (ref 3.5–5.0)
Alkaline Phosphatase: 49 U/L (ref 38–126)
Anion gap: 5 (ref 5–15)
BILIRUBIN TOTAL: 2.2 mg/dL — AB (ref 0.3–1.2)
BUN: 19 mg/dL (ref 6–20)
CHLORIDE: 116 mmol/L — AB (ref 101–111)
CO2: 21 mmol/L — AB (ref 22–32)
CREATININE: 1.16 mg/dL (ref 0.61–1.24)
Calcium: 7.6 mg/dL — ABNORMAL LOW (ref 8.9–10.3)
Glucose, Bld: 126 mg/dL — ABNORMAL HIGH (ref 65–99)
Potassium: 3 mmol/L — ABNORMAL LOW (ref 3.5–5.1)
Sodium: 142 mmol/L (ref 135–145)
Total Protein: 6 g/dL — ABNORMAL LOW (ref 6.5–8.1)

## 2015-02-10 LAB — TYPE AND SCREEN
ABO/RH(D): O POS
Antibody Screen: NEGATIVE
UNIT DIVISION: 0
UNIT DIVISION: 0
UNIT DIVISION: 0
UNIT DIVISION: 0
Unit division: 0
Unit division: 0

## 2015-02-10 LAB — PHOSPHORUS: Phosphorus: 3 mg/dL (ref 2.5–4.6)

## 2015-02-10 LAB — PROTIME-INR
INR: 1.87 — ABNORMAL HIGH (ref 0.00–1.49)
PROTHROMBIN TIME: 21.5 s — AB (ref 11.6–15.2)

## 2015-02-10 LAB — FIBRINOGEN: FIBRINOGEN: 113 mg/dL — AB (ref 204–475)

## 2015-02-10 LAB — AMMONIA: Ammonia: 44 umol/L — ABNORMAL HIGH (ref 9–35)

## 2015-02-10 LAB — MAGNESIUM: Magnesium: 1.7 mg/dL (ref 1.7–2.4)

## 2015-02-10 LAB — APTT: APTT: 49 s — AB (ref 24–37)

## 2015-02-10 MED ORDER — VANCOMYCIN HCL IN DEXTROSE 750-5 MG/150ML-% IV SOLN
750.0000 mg | Freq: Two times a day (BID) | INTRAVENOUS | Status: DC
  Administered 2015-02-10 (×2): 750 mg via INTRAVENOUS
  Filled 2015-02-10 (×4): qty 150

## 2015-02-10 MED ORDER — DEXTROSE 5 % IV SOLN
2.0000 g | INTRAVENOUS | Status: DC
  Administered 2015-02-10 – 2015-02-18 (×9): 2 g via INTRAVENOUS
  Filled 2015-02-10 (×9): qty 2

## 2015-02-10 MED ORDER — LACTULOSE ENEMA
300.0000 mL | Freq: Three times a day (TID) | ORAL | Status: DC
  Administered 2015-02-10 – 2015-02-16 (×17): 300 mL via RECTAL
  Filled 2015-02-10 (×30): qty 300

## 2015-02-10 MED ORDER — SODIUM CHLORIDE 0.9 % IJ SOLN
10.0000 mL | INTRAMUSCULAR | Status: DC | PRN
  Administered 2015-02-23 – 2015-02-26 (×7): 10 mL
  Filled 2015-02-10 (×7): qty 40

## 2015-02-10 MED ORDER — SODIUM CHLORIDE 0.9 % IJ SOLN
10.0000 mL | Freq: Two times a day (BID) | INTRAMUSCULAR | Status: DC
Start: 2015-02-10 — End: 2015-02-26
  Administered 2015-02-10 – 2015-02-11 (×2): 10 mL
  Administered 2015-02-11: 20 mL
  Administered 2015-02-12 – 2015-02-13 (×3): 10 mL
  Administered 2015-02-13: 30 mL
  Administered 2015-02-14 – 2015-02-18 (×8): 10 mL
  Administered 2015-02-18: 30 mL
  Administered 2015-02-19 – 2015-02-20 (×3): 10 mL
  Administered 2015-02-21: 30 mL
  Administered 2015-02-24 – 2015-02-25 (×2): 10 mL

## 2015-02-10 MED ORDER — LACTULOSE ENEMA
300.0000 mL | Freq: Once | ORAL | Status: AC
  Administered 2015-02-10: 300 mL via RECTAL
  Filled 2015-02-10: qty 300

## 2015-02-10 MED ORDER — POTASSIUM CHLORIDE 10 MEQ/50ML IV SOLN
10.0000 meq | INTRAVENOUS | Status: AC
  Administered 2015-02-10 (×6): 10 meq via INTRAVENOUS
  Filled 2015-02-10 (×5): qty 50

## 2015-02-10 MED ORDER — MAGNESIUM SULFATE 2 GM/50ML IV SOLN
2.0000 g | Freq: Once | INTRAVENOUS | Status: AC
  Administered 2015-02-10: 2 g via INTRAVENOUS
  Filled 2015-02-10: qty 50

## 2015-02-10 MED ORDER — POTASSIUM CHLORIDE 20 MEQ/15ML (10%) PO SOLN
30.0000 meq | ORAL | Status: DC
  Administered 2015-02-10: 30 meq
  Filled 2015-02-10: qty 30

## 2015-02-10 NOTE — Progress Notes (Signed)
Date:  February 10, 2015 U.R. performed for needs and level of care. Will continue to follow for Case Management needs.  Rhonda Davis, RN, BSN, CCM   [phone] 

## 2015-02-10 NOTE — Progress Notes (Signed)
PULMONARY / CRITICAL CARE MEDICINE   Name: Francisco [name] MRN: [id number] DOB: 07-27-1965    ADMISSION DATE:  02/06/2015 CONSULTATION DATE:  02/06/15  REFERRING MD :  ED  CHIEF COMPLAINT:  Variceal Bleed & CHF  INITIAL PRESENTATION: Patient admitted with worsening dyspnea and lower extremity edema. Recently discharged from SNF after long hospitalization at OSH in [place] with GIB from esophageal varices post banding.  STUDIES:  Portable CXR 8/6 - Peribronchial cuffing. No consolidation or effusion.  SIGNIFICANT EVENTS: 8/6 - Admit to ICU 8/6 - Transfuse 1u PRBCs 8/6 - Elective intubation & Left IJ placed 8/6 - EGD w/o varices but w/ small peptic ulcers 8/7 paracentesis 3.8 liters.   SUBJECTIVE:  Bradycardia overnight on Precedex. Still requiring vasopressor support. Off sedation but not responsive. OGT had some minimal output that looked like Lactulose. No BM.  ROS:  Unobtainable as patient is intubated & sedated.  VITAL SIGNS: Temp:  [94.1 F (34.5 C)-98.3 F (36.8 C)] 94.1 F (34.5 C) (08/10 0857) Pulse Rate:  [55-108] 55 (08/10 0315) Resp:  [0-19] 15 (08/10 0857) BP: (79-111)/(53-90) 92/77 mmHg (08/10 0845) SpO2:  [100 %] 100 % (08/10 0857) FiO2 (%):  [30 %] 30 % (08/10 0823) Weight:  [242 lb 1 oz (109.8 kg)] 242 lb 1 oz (109.8 kg) (08/10 0441) HEMODYNAMICS: CVP:  [12 mmHg-29 mmHg] 13 mmHg VENTILATOR SETTINGS: Vent Mode:  [-] PRVC FiO2 (%):  [30 %] 30 % Set Rate:  [15 bmp] 15 bmp Vt Set:  [580 mL] 580 mL PEEP:  [5 cmH20] 5 cmH20 Plateau Pressure:  [20 cmH20-25 cmH20] 24 cmH20 INTAKE / OUTPUT:  Intake/Output Summary (Last 24 hours) at 02/10/15 0905 Last data filed at 02/10/15 0900  Gross per 24 hour  Intake 2612.67 ml  Output   1315 ml  Net 1297.67 ml    PHYSICAL EXAMINATION: General:  Resting with eyes closed in bed. No distress. Warming blanket in place. Neuro:  Sedated. Withdraws to pain in all 4 extremities. Not following commands. HEENT:  Tacky MM. ETT  in place. No scleral injection. PERRL. Cardiovascular:  Reg rate. Lower extremity edema persists. Lungs:  Decreased aeration bilateral bases. Continuing on Vent. Abdomen:  Protuberant but soft. Bowel sounds present but hypoactive. Musculoskeletal:  No joint deformity or effusion.  Skin:  Warm & dry. No rash. No cyanosis.  LABS:  CBC  Recent Labs Lab 02/09/15 0550 02/09/15 1638 02/10/15 0430  WBC 9.6 8.0 7.3  HGB 9.2* 8.2* 8.9*  HCT 28.1* 25.2* 27.1*  PLT 89* 71* 72*   Coag's  Recent Labs Lab 02/08/15 0432 02/09/15 0550 02/10/15 0430  APTT 45* 44* 49*  INR 1.80* 1.72* 1.87*   BMET  Recent Labs Lab 02/08/15 0432 02/09/15 0550 02/10/15 0430  NA 141 141 142  K 3.2* 3.3* 3.0*  CL 112* 114* 116*  CO2 22 22 21*  BUN 33* 26* 19  CREATININE 1.96* 1.58* 1.16  GLUCOSE 92 119* 126*   Electrolytes  Recent Labs Lab 02/08/15 0432 02/09/15 0550 02/10/15 0430  CALCIUM 7.8* 8.0* 7.6*  MG 2.0 1.8 1.7  PHOS 3.4 3.9 3.0   Sepsis Markers  Recent Labs Lab 02/06/15 0944 02/06/15 1830 02/07/15 0340  LATICACIDVEN 2.99* 3.1* 1.9   ABG  Recent Labs Lab 02/06/15 0856 02/06/15 1843  PHART 7.450 7.419  PCO2ART 33.1* 34.2*  PO2ART 119* 478*   Liver Enzymes  Recent Labs Lab 02/08/15 0432 02/09/15 0550 02/10/15 0430  AST 37 51* 51*  ALT 13* 16* 17  ALKPHOS 48 58 49  BILITOT 2.3* 2.4* 2.2*  ALBUMIN 2.5* 2.5* 2.1*   Cardiac Enzymes No results for input(s): TROPONINI, PROBNP in the last 168 hours. Glucose  Recent Labs Lab 02/09/15 1158 02/09/15 1611 02/09/15 2036 02/09/15 2327 02/10/15 0318 02/10/15 0746  GLUCAP 106* 118* 121* 125* 127* 117*    Imaging Dg Abd 1 View  02/09/2015   CLINICAL DATA:  Constipated, abdominal distension  EXAM: ABDOMEN - 1 VIEW  COMPARISON:  02/08/2015  FINDINGS: The bowel gas pattern is normal. No radio-opaque calculi or other significant radiographic abnormality are seen.  IMPRESSION: Negative.   Electronically Signed    By: Elige Ko   On: 02/09/2015 10:41     ASSESSMENT / PLAN:  PULMONARY OETT 8/6>> A: Intubated for upper endoscopy electively - Mental status prohibits extubation. Low inspiratory volumes  P:   Wean FiO2 for Sat >92%. Plan for extubation once mental status improves Plan for paracentesis prior to extubation  CARDIOVASCULAR L IJ CVC 8/6>> A:  Grade I diastolic dysfunction  Hypotension - Secondary to sedation vs Septic Shock Bradycardia - Secondary to Precedex  P:  Hold Precedex Cont tele Wean Neo for MAP > 65 Plan for PICC today to remove IJ CVC   RENAL A:   Acute on Chronic RF - Unknown baseline creatinine. Previously on HD.Improving. Lactic Acidosis - Resolving Hypokalemia - Replacing IV Hyperchloremia - persists  P:   Monitor UOP Neo to maintain MAP>65 Trend daily BUN/Creatinine Monitor electrolytes daily with labs KCl 60 mEq IV   GASTROINTESTINAL A:   GIB - Secondary to peptic ulcers. Has been taking Aleve. H/O Variceal Bleed - s/p OSH banding June 2016. EGD w/o varices Liver Cirrhosis w/ ascites - Plan for paracentesis prior to extubation SBP Probable Ileus  P:   Lactulose enema Suspect Ileus - OGT to lower intermittent suction GI following See ID for SBP Trending Coags & Fibrinogen daily for synthetic function Protonix IV bid   HEMATOLOGIC A:   Anemia - Secondary to GIB. Stable. No signs of active bleeding. Coagulopathy - Secondary to cirrhosis Thrombocytopenia - Mild & stable  P:  Trending Coags daily Trending Hgb daily Transfuse for Hgb <7.0 or active bleeding SCDs for DVT prophylaxis No chemical DVT prophylaxis given GIB  INFECTIOUS A:   SBP - culture pending  P:   Trending leukocyte count daily  BCx2 8/6>> Ascites 8/8>>>GPC & GVR  Abx:  Rocephin, start date 8/6, day 5/x Vancomycin, start date 8/8, day 2/x  ENDOCRINE A:   No acute issues.  P:   Accuchecks q4hr MD to be notified for BG <90 or >160 D5LR @  50cc/hr  NEUROLOGIC A:   Acute encephalopathy - Hepatic vs Medication Induced  P:   RASS goal: 0 to -1 PRN Fentanyl PRN Ativan Lactulose enema Continue Thiamine IV daily  FAMILY  - Updates: No family at bedside for update. RN reports wife called in yesterday for update.  - Inter-disciplinary family meet or Palliative Care meeting due by:  8/13   TODAY'S SUMMARY: Renal function continually improving. Still requiring vasopressors and suspect this is confounded by sepsis. Off sedating medications but minimally responsive. Likely has some element of hepatic encephalopathy. No BM - suspect ileus. NGT to low intermittent suction & trying lactulose enema.  I have spent a total of 35 minutes of critical care time today caring for this patient and reviewing his EMR.  Donna Christen Jamison Neighbor, M.D. [facility] Pager:  [phone] After 3pm  or if no response, call [phone] 02/10/2015, 9:05 AM

## 2015-02-10 NOTE — Progress Notes (Signed)
[facility] ADULT ICU REPLACEMENT PROTOCOL FOR AM LAB REPLACEMENT ONLY  The patient does apply for the [facility] Adult ICU Electrolyte Replacment Protocol based on the criteria listed below:   1. Is GFR >/= 40 ml/min? Yes.    Patient's GFR today is >60 2. Is urine output >/= 0.5 ml/kg/hr for the last 6 hours? Yes.   Patient's UOP is 0.52 ml/kg/hr 3. Is BUN < 60 mg/dL? Yes.    Patient's BUN today is 19 4. Abnormal electrolyte(s): Potassium 3.0, Magnesium, 1.7 5. Ordered repletion with: [facility] adult ICU replacement protocol 6. If a panic level lab has been reported, has the CCM MD in charge been notified? Yes.  .   Physician:  Dr. Levy Pupa  [facility], Alda Berthold E 02/10/2015 6:19 AM

## 2015-02-10 NOTE — Progress Notes (Signed)
Peripherally Inserted Central Catheter/Midline Placement  The IV Nurse has discussed with the patient and/or persons authorized to consent for the patient, the purpose of this procedure and the potential benefits and risks involved with this procedure.  The benefits include less needle sticks, lab draws from the catheter and patient may be discharged home with the catheter.  Risks include, but not limited to, infection, bleeding, blood clot (thrombus formation), and puncture of an artery; nerve damage and irregular heat beat.  Alternatives to this procedure were also discussed.  PICC/Midline Placement Documentation    Information given to wife [name], [name] 02/10/2015, 1:04 PM

## 2015-02-10 NOTE — Progress Notes (Signed)
ANTIBIOTIC CONSULT NOTE  Pharmacy Consult for Vancomycin  Indication: Sepsis  No Known Allergies  Patient Measurements: Height:  (177.8 cm) Weight: 242 lb 1 oz (109.8 kg) IBW/kg (Calculated) : 73  Vital Signs: Temp: 94.4 F (34.7 C) (08/10 0800) Temp Source: Rectal (08/10 0800) BP: 103/90 mmHg (08/10 0745) Pulse Rate: 55 (08/10 0315) Intake/Output from previous day: 08/09 0701 - 08/10 0700 In: 2675.4 [I.V.:1995.4; IV Piggyback:550] Out: 935 [Urine:935] Intake/Output from this shift:    Labs:  Recent Labs  02/08/15 0432  02/09/15 0550 02/09/15 1638 02/10/15 0430  WBC  --   < > 9.6 8.0 7.3  HGB  --   < > 9.2* 8.2* 8.9*  PLT  --   < > 89* 71* 72*  CREATININE 1.96*  --  1.58*  --  1.16  < > = values in this interval not displayed. Estimated Creatinine Clearance: 93.5 mL/min (by C-G formula based on Cr of 1.16). No results for input(s): VANCOTROUGH, VANCOPEAK, VANCORANDOM, GENTTROUGH, GENTPEAK, GENTRANDOM, TOBRATROUGH, TOBRAPEAK, TOBRARND, AMIKACINPEAK, AMIKACINTROU, AMIKACIN in the last 72 hours.   Microbiology: Recent Results (from the past 720 hour(s))  Blood culture (routine x 2)     Status: None (Preliminary result)   Collection Time: 02/06/15 11:41 AM  Result Value Ref Range Status   Specimen Description BLOOD LEFT HAND  Final   Special Requests IN PEDIATRIC BOTTLE 2CC  Final   Culture   Final    NO GROWTH 3 DAYS Performed at [facility]    Report Status PENDING  Incomplete  MRSA PCR Screening     Status: None   Collection Time: 02/06/15  1:00 PM  Result Value Ref Range Status   MRSA by PCR NEGATIVE NEGATIVE Final    Comment:        The GeneXpert MRSA Assay (FDA approved for NASAL specimens only), is one component of a comprehensive MRSA colonization surveillance program. It is not intended to diagnose MRSA infection nor to guide or monitor treatment for MRSA infections.   Blood culture (routine x 2)     Status: None (Preliminary  result)   Collection Time: 02/06/15  6:40 PM  Result Value Ref Range Status   Specimen Description BLOOD LEFT HAND  6 ML IN AEROBIC ONLY  Final   Special Requests NONE  Final   Culture   Final    NO GROWTH 2 DAYS Performed at [facility]    Report Status PENDING  Incomplete  Culture, body fluid-bottle     Status: None (Preliminary result)   Collection Time: 02/07/15 12:46 PM  Result Value Ref Range Status   Specimen Description PERITONEAL  Final   Special Requests NONE  Final   Gram Stain   Final    GRAM POSITIVE COCCI IN CLUSTERS GRAM VARIABLE ROD ANAEROBIC BOTTLE ONLY CRITICAL RESULT CALLED TO, READ BACK BY AND VERIFIED WITH: R JOHNSON,RN AT 1446 02/08/15 BY L BENFIELD    Culture   Final    CULTURE REINCUBATED FOR BETTER GROWTH Performed at [facility]    Report Status PENDING  Incomplete  Gram stain     Status: None   Collection Time: 02/07/15 12:46 PM  Result Value Ref Range Status   Specimen Description PERITONEAL  Final   Special Requests NONE  Final   Gram Stain   Final    CYTOSPIN SLIDE WBC PRESENT,BOTH PMN AND MONONUCLEAR NO ORGANISMS SEEN Performed at [facility]    Report Status 02/07/2015 FINAL  Final    Medical History: Past Medical History  Diagnosis Date  . Hepatic cirrhosis   . Esophageal varices     last banding in [place] June 2016  . Chronic renal failure     was on hemodialysis in June 2016  . Congestive heart failure     questionable hx & on lasix  . Ascites    Assessment: 16 yoM with PMHx hepatic cirrhosis, CKD previously on HD in June '16, and CHF with ascites admitted to [facility] on 02/06/15 with worsening dyspnea and lower extremity edema.  Pt recently d/c'd from SNF after long hospitalization at OSH from GIB with esophageal varices post banding. Pt currently intubated and sedated and s/p paracentesis on 8/7. Pt more hypotensive overnight and s/p volume resuscitation and now peritoneal fluid growing GPC in clusters and gram  variable rod.  Pharmacy consulted to start vancomycin for sepsis.  Renal function has now improved to baseline.  Anti-infectives 8/6 >> Ceftriaxone  >> 8/8 >> Vancomycin  >>    Vitals/Labs WBC: WNL Tm24h: Afebrile  SCr: Improved- NCrCl~80ml/min  Cultures 8/6 bloodx2: NGTD 8/7 peritoneal fluid: GPC in clusters, gram variable rod (reincubated)  Goal of Therapy:  Vancomycin trough level 15-20 mcg/ml  Plan:  Increase Rocephin to 2gm q24h (higher dose recommended to adequately penetrate ascitic fluid) Increase Vancomycin 750 mg IV q12h for improved renal function F/u renal function closely, check VT as Css as warranted F/u cultures, clinical course  Junita Push, PharmD, BCPS Pager: [phone] 02/10/2015, 8:22 AM

## 2015-02-10 NOTE — Progress Notes (Signed)
    Progress Note   Subjective  off sedatives but still minimally responsive.    Objective   Vital signs in last 24 hours: Temp:  [94.1 F (34.5 C)-98.3 F (36.8 C)] 95.4 F (35.2 C) (08/10 1100) Pulse Rate:  [55-108] 55 (08/10 0315) Resp:  [0-19] 15 (08/10 1100) BP: (79-108)/(53-90) 88/58 mmHg (08/10 1100) SpO2:  [100 %] 100 % (08/10 1100) FiO2 (%):  [30 %] 30 % (08/10 0823) Weight:  [242 lb 1 oz (109.8 kg)] 242 lb 1 oz (109.8 kg) (08/10 0441) Last BM Date: 02/05/15 General:    Black male, minimally responsive on vent Heart:  bradycardic Abdomen:  Soft, largely distended. He is tympanitic on top but sides of abdomen flat to percussion. Difficult to assess bowel sounds with intermittent NGT suction going.  Extremities:  Bilateral upper and lower edema.  Neurologic:  Minimally responsive.    Lab Results:  Recent Labs  02/09/15 0550 02/09/15 1638 02/10/15 0430  WBC 9.6 8.0 7.3  HGB 9.2* 8.2* 8.9*  HCT 28.1* 25.2* 27.1*  PLT 89* 71* 72*   BMET  Recent Labs  02/08/15 0432 02/09/15 0550 02/10/15 0430  NA 141 141 142  K 3.2* 3.3* 3.0*  CL 112* 114* 116*  CO2 22 22 21*  GLUCOSE 92 119* 126*  BUN 33* 26* 19  CREATININE 1.96* 1.58* 1.16  CALCIUM 7.8* 8.0* 7.6*   LFT  Recent Labs  02/10/15 0430  PROT 6.0*  ALBUMIN 2.1*  AST 51*  ALT 17  ALKPHOS 49  BILITOT 2.2*   PT/INR  Recent Labs  02/09/15 0550 02/10/15 0430  LABPROT 20.2* 21.5*  INR 1.72* 1.87*      Assessment / Plan:    1. Decompensated cirrhosis with ascites and coagulopathy. Admitted with GI bleed with acute blood loss anemia. EGD revealed GE junction ulcerations, probably from previous band ligation of varices in [place]. Hgb stable. On BID PPI. He had LVP with 4 liters removed 3 days ago. Gram stain c/w GPC in cluster and Gram variable rod. Was already on Rocephin, CCM added Vanco. He may need a second LVP prior to extubation. Renal function back to normal so should be able to take off  more than 4 liters this time.  Remains critically ill and on ventilator.  2. Altered mental status. Off Precedex since 5am but still only minimally responsive. Ammonia only 44 but still likely encephalopathic. Tried to open eyes at times. CCM stopped lactulose and xifaxan secondary to possibe ileus. Lactulose enema to be given this am. Abdominal film yesterday revealed normal bowel gas pattern.  I agree he may still have an ileus but also think that a significant amount of his distention is ascites related. Will give him scheduled doses of Lactulose enemas for now. Restart PO lactulose and Xifaxan when able.   3.  GPC clusters and G+ rods on ascites gramstain - ? Contaminant as only 44 WBC on cell count - covered w/ Abx   LOS: 3 days      LOS: 4 days   Willette Cluster  02/10/2015, 11:20 AM   Agree with Ms. Dorice Lamas assessment and plan. Iva Boop, MD, [facility]

## 2015-02-11 ENCOUNTER — Inpatient Hospital Stay (HOSPITAL_COMMUNITY): Payer: Medicaid - Out of State

## 2015-02-11 LAB — COMPREHENSIVE METABOLIC PANEL
ALT: 15 U/L — AB (ref 17–63)
ANION GAP: 8 (ref 5–15)
AST: 54 U/L — AB (ref 15–41)
Albumin: 2.1 g/dL — ABNORMAL LOW (ref 3.5–5.0)
Alkaline Phosphatase: 58 U/L (ref 38–126)
BUN: 16 mg/dL (ref 6–20)
CHLORIDE: 115 mmol/L — AB (ref 101–111)
CO2: 18 mmol/L — AB (ref 22–32)
Calcium: 8.1 mg/dL — ABNORMAL LOW (ref 8.9–10.3)
Creatinine, Ser: 1.24 mg/dL (ref 0.61–1.24)
GFR calc Af Amer: >60 mL/min (ref >60)
GFR calc non Af Amer: >60 mL/min (ref >60)
Glucose, Bld: 108 mg/dL — ABNORMAL HIGH (ref 65–99)
Potassium: 3.3 mmol/L — ABNORMAL LOW (ref 3.5–5.1)
Sodium: 141 mmol/L (ref 135–145)
Total Bilirubin: 2.3 mg/dL — ABNORMAL HIGH (ref 0.3–1.2)
Total Protein: 6.5 g/dL (ref 6.5–8.1)

## 2015-02-11 LAB — GLUCOSE, CAPILLARY
GLUCOSE-CAPILLARY: 95 mg/dL (ref 65–99)
GLUCOSE-CAPILLARY: 104 mg/dL — AB (ref 65–99)
Glucose-Capillary: 109 mg/dL — ABNORMAL HIGH (ref 65–99)
Glucose-Capillary: 116 mg/dL — ABNORMAL HIGH (ref 65–99)
Glucose-Capillary: 99 mg/dL (ref 65–99)
Glucose-Capillary: 89 mg/dL (ref 65–99)

## 2015-02-11 LAB — CBC
HCT: 28.8 % — ABNORMAL LOW (ref 39.0–52.0)
HEMATOCRIT: 23.5 % — AB (ref 39.0–52.0)
HEMOGLOBIN: 7.5 g/dL — AB (ref 13.0–17.0)
Hemoglobin: 9.2 g/dL — ABNORMAL LOW (ref 13.0–17.0)
MCH: 27.9 pg (ref 26.0–34.0)
MCH: 28.1 pg (ref 26.0–34.0)
MCHC: 31.9 g/dL (ref 30.0–36.0)
MCHC: 31.9 g/dL (ref 30.0–36.0)
MCV: 87.3 fL (ref 78.0–100.0)
MCV: 88 fL (ref 78.0–100.0)
Platelets: 75 10*3/uL — ABNORMAL LOW (ref 150–400)
Platelets: 60 10*3/uL — ABNORMAL LOW (ref 150–400)
RBC: 3.3 MIL/uL — AB (ref 4.22–5.81)
RBC: 2.67 MIL/uL — ABNORMAL LOW (ref 4.22–5.81)
RDW: 17.5 % — ABNORMAL HIGH (ref 11.5–15.5)
RDW: 17.8 % — ABNORMAL HIGH (ref 11.5–15.5)
WBC: 14.1 10*3/uL — ABNORMAL HIGH (ref 4.0–10.5)
WBC: 12 10*3/uL — ABNORMAL HIGH (ref 4.0–10.5)

## 2015-02-11 LAB — TROPONIN I: Troponin I: <0.03 ng/mL (ref <0.031)

## 2015-02-11 LAB — APTT: APTT: 50 s — AB (ref 24–37)

## 2015-02-11 LAB — CULTURE, BLOOD (ROUTINE X 2): Culture: NO GROWTH

## 2015-02-11 LAB — PHOSPHORUS: Phosphorus: 3.3 mg/dL (ref 2.5–4.6)

## 2015-02-11 LAB — MAGNESIUM: Magnesium: 2 mg/dL (ref 1.7–2.4)

## 2015-02-11 LAB — PROTIME-INR
INR: 2.12 — ABNORMAL HIGH (ref 0.00–1.49)
Prothrombin Time: 23.6 seconds — ABNORMAL HIGH (ref 11.6–15.2)

## 2015-02-11 LAB — FIBRINOGEN: Fibrinogen: 106 mg/dL — ABNORMAL LOW (ref 204–475)

## 2015-02-11 MED ORDER — FUROSEMIDE 10 MG/ML IJ SOLN: 20.0000 mg | INTRAMUSCULAR | Status: DC

## 2015-02-11 MED ORDER — POTASSIUM CHLORIDE 10 MEQ/50ML IV SOLN
10.0000 meq | INTRAVENOUS | Status: AC
  Administered 2015-02-11 (×6): 10 meq via INTRAVENOUS
  Filled 2015-02-11: qty 50

## 2015-02-11 MED ORDER — SODIUM CHLORIDE 0.9 % IV SOLN
Freq: Once | INTRAVENOUS | Status: AC
  Administered 2015-02-11: 15:00:00 via INTRAVENOUS

## 2015-02-11 NOTE — Progress Notes (Signed)
eLink Physician-Brief Progress Note Patient Name: Francisco [name] DOB: [birth year] MRN: [id number]   Date of Service  02/11/2015  HPI/Events of Note  RN asking for flexiseal  eICU Interventions  sent     Intervention Category Evaluation Type: Other  Myrtle Haller 02/11/2015, 5:36 AM

## 2015-02-11 NOTE — Progress Notes (Addendum)
    Progress Note   Subjective  Agitated during the night per staff   Objective   Vital signs in last 24 hours: Temp:  [96.4 F (35.8 C)-99.7 F (37.6 C)] 98.8 F (37.1 C) (08/11 1300) Pulse Rate:  [87] 87 (08/10 1556) Resp:  [9-18] 14 (08/11 1300) BP: (87-119)/(57-85) 100/69 mmHg (08/11 1300) SpO2:  [100 %] 100 % (08/11 1300) FiO2 (%):  [30 %] 30 % (08/11 0950) Weight:  [241 lb 13.5 oz (109.7 kg)] 241 lb 13.5 oz (109.7 kg) (08/11 0500) Last BM Date: 02/11/15 General:    Black male in NAD Abdomen:  Soft, largely distended, more tympanitic today. Normal bowel sounds. Extremities:  Without edema. Neurologic:  obtunded.   Intake/Output from previous day: 08/10 0701 - 08/11 0700 In: 3006 [I.V.:2326; NG/GT:30; IV Piggyback:650] Out: 835 [Urine:635; Emesis/NG output:200] Intake/Output this shift: Total I/O In: 716.1 [I.V.:586.1; Other:30; IV Piggyback:100] Out: 155 [Urine:155]  Lab Results:  Recent Labs  02/10/15 0430 02/10/15 1910 02/11/15 0500  WBC 7.3 11.8* 14.1*  HGB 8.9* 9.6* 9.2*  HCT 27.1* 28.8* 28.8*  PLT 72* 86* 75*   BMET  Recent Labs  02/09/15 0550 02/10/15 0430 02/11/15 0500  NA 141 142 141  K 3.3* 3.0* 3.3*  CL 114* 116* 115*  CO2 22 21* 18*  GLUCOSE 119* 126* 108*  BUN 26* 19 16  CREATININE 1.58* 1.16 1.24  CALCIUM 8.0* 7.6* 8.1*   LFT  Recent Labs  02/11/15 0500  PROT 6.5  ALBUMIN 2.1*  AST 54*  ALT 15*  ALKPHOS 58  BILITOT 2.3*   PT/INR  Recent Labs  02/10/15 0430 02/11/15 0500  LABPROT 21.5* 23.6*  INR 1.87* 2.12*     Assessment / Plan:     1. GI bleed with acute blood loss anemia. EGD revealed GE junction ulcerations, probably from previous band ligation of varices in [place]. Hgb stable. On BID PPI. INR up to 2.12. No signs of bleeding.   2. Decompensated cirrhosis wiith ascites and coagulopathy. He had LVP with 4 liters removed.  Gram stain c/w GPC in cluster and Gram variable rod. Culture >>> staphylococcus  species (coagulase negative). Contamination?  Was already on Rocephin, CCM added Vanco but that has now been discontinued. Rocephin dose increased to 2gms yesterday.   2. Altered mental status / prob multifactorial with hepatic enceph contributing. Got fentanyl and ativan for agitation during the night, still sedated / obtunded. Getting Lactulose enemas. Restart PO lactulose and Xifaxan when able.   3. Abdominal distention., probable ileus. Abdomen very tympanitic today compared to yesterday. No significant NGT output and having BMs with lactulose enema. Critical illness, sedatives, immobility likely all contributing to ileus.  4. GPC clusters and G+ rods on ascites gramstain - ? Contaminant as only 44 WBC on cell count - covered w/ Abx Staph coag neg and Bacillus    LOS: 5 days   Willette Cluster  02/11/2015, 1:32 PM   Agree with Ms. Dorice Lamas assessment and plan.  May have an Ao valve vegetation and is to get a TEE. Fibrinogen low so cryoprecipate being given Think more ascites issue than ileus - agree with tapping him if INR allows. Will recheck KUB AM May need brain evaluation - ? Increased ICP?  Grim prognosis. Explained situation to family and they plan to discuss mor with PCCM tomorrow.  Iva Boop, MD, [facility] [phone] (pager) 02/11/2015 6:10 PM

## 2015-02-11 NOTE — Progress Notes (Signed)
PULMONARY / CRITICAL CARE MEDICINE   Name: Francisco [name] MRN: [id number] DOB: 08-01-1965    ADMISSION DATE:  02/06/2015 CONSULTATION DATE:  02/06/15  REFERRING MD :  ED  CHIEF COMPLAINT:  Variceal Bleed & CHF  INITIAL PRESENTATION: Patient admitted with worsening dyspnea and lower extremity edema. Recently discharged from SNF after long hospitalization at OSH in [place] with GIB from esophageal varices post banding.  STUDIES:  Portable CXR 8/6 - Peribronchial cuffing. No consolidation or effusion. TTE 8/7 - EF 55-60%. No wall motion abnormality. Grade 1 diastolic dysfunction. Hazy density on AV.  SIGNIFICANT EVENTS: 8/6 - Admit to ICU 8/6 - Transfuse 1u PRBCs 8/6 - Elective intubation & Left IJ placed 8/6 - EGD w/o varices but w/ small peptic ulcers 8/7 - paracentesis 3.8 liters.  8/11 - slowly increasing vasopressor requirement  SUBJECTIVE:  Patient received intermittent Fentanyl & Ativan overnight. Remains altered.   ROS:  Unobtainable as patient is intubated & sedated.  VITAL SIGNS: Temp:  [95.7 F (35.4 C)-99.7 F (37.6 C)] 99 F (37.2 C) (08/11 0800) Pulse Rate:  [87-120] 87 (08/10 1556) Resp:  [13-20] 14 (08/11 0800) BP: (84-119)/(52-85) 103/62 mmHg (08/11 0800) SpO2:  [100 %] 100 % (08/11 0800) FiO2 (%):  [30 %] 30 % (08/11 0950) Weight:  [241 lb 13.5 oz (109.7 kg)] 241 lb 13.5 oz (109.7 kg) (08/11 0500) HEMODYNAMICS: CVP:  [13 mmHg] 13 mmHg VENTILATOR SETTINGS: Vent Mode:  [-] PRVC FiO2 (%):  [30 %] 30 % Set Rate:  [15 bmp] 15 bmp Vt Set:  [580 mL] 580 mL PEEP:  [5 cmH20] 5 cmH20 Plateau Pressure:  [11 cmH20-23 cmH20] 17 cmH20 INTAKE / OUTPUT:  Intake/Output Summary (Last 24 hours) at 02/11/15 1124 Last data filed at 02/11/15 0800  Gross per 24 hour  Intake 2513.24 ml  Output    735 ml  Net 1778.24 ml    PHYSICAL EXAMINATION: General:  Not awake or alert. No distress. Sedate. Neuro:  Sedate. Withdraws to pain in all 4 extremities. Opens eyes  spontaneously. HEENT:  Tacky MM. ETT in place. No scleral injection. PERRL. Cardiovascular:  Reg rate. Lower extremity edema persists. Lungs:  Decreased aeration bilateral bases. Continuing on Vent with normal WOB PS 5/5. Abdomen:  Protuberant but soft. Bowel sounds present but hypoactive. Musculoskeletal:  No joint deformity or effusion.  Skin:  Warm & dry. No rash.   LABS:  CBC  Recent Labs Lab 02/10/15 0430 02/10/15 1910 02/11/15 0500  WBC 7.3 11.8* 14.1*  HGB 8.9* 9.6* 9.2*  HCT 27.1* 28.8* 28.8*  PLT 72* 86* 75*   Coag's  Recent Labs Lab 02/09/15 0550 02/10/15 0430 02/11/15 0500  APTT 44* 49* 50*  INR 1.72* 1.87* 2.12*   BMET  Recent Labs Lab 02/09/15 0550 02/10/15 0430 02/11/15 0500  NA 141 142 141  K 3.3* 3.0* 3.3*  CL 114* 116* 115*  CO2 22 21* 18*  BUN 26* 19 16  CREATININE 1.58* 1.16 1.24  GLUCOSE 119* 126* 108*   Electrolytes  Recent Labs Lab 02/09/15 0550 02/10/15 0430 02/11/15 0500  CALCIUM 8.0* 7.6* 8.1*  MG 1.8 1.7 2.0  PHOS 3.9 3.0 3.3   Sepsis Markers  Recent Labs Lab 02/06/15 0944 02/06/15 1830 02/07/15 0340  LATICACIDVEN 2.99* 3.1* 1.9   ABG  Recent Labs Lab 02/06/15 0856 02/06/15 1843  PHART 7.450 7.419  PCO2ART 33.1* 34.2*  PO2ART 119* 478*   Liver Enzymes  Recent Labs Lab 02/09/15 0550 02/10/15 0430 02/11/15 0500  AST  51* 51* 54*  ALT 16* 17 15*  ALKPHOS 58 49 58  BILITOT 2.4* 2.2* 2.3*  ALBUMIN 2.5* 2.1* 2.1*   Cardiac Enzymes No results for input(s): TROPONINI, PROBNP in the last 168 hours. Glucose  Recent Labs Lab 02/10/15 1139 02/10/15 1609 02/10/15 2038 02/11/15 0019 02/11/15 0332 02/11/15 0743  GLUCAP 139* 102* 108* 116* 109* 95    Imaging Dg Chest Port 1 View  02/11/2015   CLINICAL DATA:  Respiratory failure, intubated patient.  EXAM: PORTABLE CHEST - 1 VIEW  COMPARISON:  Portable chest x-ray of February 09, 2015  FINDINGS: The lungs remain hypoinflated. The interstitial markings  remain diffusely increased but have improved slightly. The cardiac silhouette is mildly enlarged though stable. The central pulmonary vascularity prominent. The endotracheal tube tip lies 4.5 cm above the carina. The esophagogastric tube tip projects in the gastric cardia and is stable. There are degenerative changes of both shoulders.  IMPRESSION: Persistent bilateral hypoinflation. Slight interval improvement in the appearance of the pulmonary interstitium. There is no alveolar pneumonia. Support tubes are in reasonable position.   Electronically Signed   By: David  [place] M.D.   On: 02/11/2015 07:37     ASSESSMENT / PLAN:  PULMONARY OETT 8/6>> A: Intubated for upper endoscopy electively - Mental status prohibits extubation. Low inspiratory volumes  P:   Wean FiO2 for Sat >92%. Plan for extubation once mental status improves Plan for paracentesis prior to extubation  CARDIOVASCULAR L IJ CVC 8/6>>8/10 PICC 8/10>> A:  Grade I diastolic dysfunction  Question AV Vegitation Hypotension - Secondary to sedation vs Septic Shock Bradycardia - Secondary to Precedex Prolonged QTc  P:  Checking TTE for possible vegitation Hold Precedex Cont tele Wean Neo for MAP > 65 Checking Troponin I q6hr EKG today to monitor QTc  RENAL A:   Acute on Chronic RF - Unknown baseline creatinine. Previously on HD. Stable. Lactic Acidosis - Resolving Hypokalemia - Replacing IV Hyperchloremia - Improving.  P:   Monitor UOP Neo to maintain MAP>65 Trend daily BUN/Creatinine Monitor electrolytes daily with labs KCl 60 mEq IV again today  GASTROINTESTINAL A:   GIB - Secondary to peptic ulcers. Has been taking Aleve. H/O Variceal Bleed - s/p OSH banding June 2016. EGD w/o varices Liver Cirrhosis w/ ascites - Plan for paracentesis prior to extubation SBP - Bacillus species Probable Ileus  P:   Lactulose enema tid Resume Rifaximin as bowel function returns Suspect Ileus - OGT to lower  intermittent suction GI following See ID for SBP Trending Coags & Fibrinogen daily for synthetic function Protonix IV bid   HEMATOLOGIC A:   Anemia - Secondary to GIB. Stable. No signs of active bleeding. Coagulopathy - Secondary to cirrhosis Thrombocytopenia - Mild & stable  P:  Trending Coags daily Trending Hgb daily Transfuse for Hgb <7.0 or active bleeding SCDs for DVT prophylaxis No chemical DVT prophylaxis given GIB Transfuse 1unit Cryo given increasing INR & decreasing Fibrinogen w/ planned TEE  INFECTIOUS A:   SBP - Bacillus species & Coag Neg Staph Possible Aortic Valve Vegitation  P:   D/C Vancomycin Trending leukocyte count daily Monitoring for Fever Checking TTE for possible AV vegitation  BCx2 8/6>> Ascites 8/8>>>Bacillus species & Coag Neg Staph  Abx:  Rocephin, start date 8/6, day 6/x Vancomycin, 8/8>>8/11  ENDOCRINE A:   No acute issues.  P:   Accuchecks q4hr MD to be notified for BG <90 or >160 D5LR @ 50cc/hr  NEUROLOGIC A:   Acute encephalopathy - Hepatic  vs Medication Induced. Received Fentanyl 6x in last 24hr & Ativan  x2  P:   RASS goal: 0 to -1 PRN Fentanyl PRN Ativan Lactulose enema Continue Thiamine IV daily Checking UDS today  FAMILY  - Updates: No family at bedside for update. RN reports wife visited yesterday.  - Inter-disciplinary family meet or Palliative Care meeting due by:  8/13   TODAY'S SUMMARY: Continuing Lactulose enemas for hepatic encephalopathy. Plan to check UDS to determine if there is any residual medication. UOP fluctuating but overall renal function stable w/ BUN/Creatnine. No signs of any further bleeding. Patient will need paracentesis prior to extubation. Abdomen appears still soft. Given persistent hypotension plan to order TTE to evaluate for possible vegitation seen on TTE. Transfusing 1unit Cryo given worsening coagulopathy.  I have spent a total of 33 minutes of critical care time today  caring for this patient and reviewing his EMR.  Donna Christen Jamison Neighbor, M.D. [facility] Pulmonary & Critical Care Pager:  [phone] After 3pm or if no response, call [phone] 02/11/2015, 11:24 AM

## 2015-02-11 NOTE — Progress Notes (Signed)
Nutrition Follow-up  DOCUMENTATION CODES:   Obesity unspecified  INTERVENTION:   Pt has been intubated x 5 days, recommend initiating nutrition support if there is no plan to extubate soon.  TF recommendations: Initiate Vital HP @ 20 ml/hr and increase by 10 ml every 4 hours to goal rate of 40 ml/hr.   60 ml Prostat BID.   Tube feeding regimen provides 1360 kcal (100% of needs), 144 grams of protein, and 803 ml of H2O.   RD to continue to monitor   NUTRITION DIAGNOSIS:   Inadequate oral intake related to inability to eat as evidenced by NPO status.  Ongoing.  GOAL:   Provide needs based on ASPEN/SCCM guidelines  Not meeting.  MONITOR:   Vent status, Labs, Weight trends, Skin, I & O's  ASSESSMENT:   Patient admitted with worsening dyspnea and lower extremity edema. Recently discharged from SNF after long hospitalization at OSH in [place] with GIB from esophageal varices post banding. Pt intubated 8/6.  Patient is currently intubated on ventilator support MV: 8.7 L/min Temp (24hrs), Avg:97.5 F (36.4 C), Min:94.8 F (34.9 C), Max:99.7 F (37.6 C)  Propofol: none  Labs reviewed: Low K Mg/Phos WNL  Diet Order:  Diet NPO time specified  Skin:  Reviewed, no issues  Last BM:  8/11  Height:   Ht Readings from Last 1 Encounters:  02/06/15  (1.778 m)    Weight:   Wt Readings from Last 1 Encounters:  02/11/15 241 lb 13.5 oz (109.7 kg)    Ideal Body Weight:  75.5 kg  BMI:  Body mass index is 34.7 kg/(m^2).  Estimated Nutritional Needs:   Kcal:  1610-9604  Protein:  150-160g  Fluid:  1.3L/day  EDUCATION NEEDS:   No education needs identified at this time  Tilda Franco, MS, RD, LDN Pager: [phone] After Hours Pager: [phone]

## 2015-02-11 NOTE — Progress Notes (Deleted)
eLink Physician-Brief Progress Note Patient Name: Francisco [name] DOB: 12-11-1965 MRN: [id number]   Date of Service  02/11/2015  HPI/Events of Note  Variable  0 Points 1 Point 2 Points Total  Heart rate per minute  <90 beats 90-109 beats >110 beats 2  Respiratory  Rate per minute < 18 breaths 19-30 breaths  >30 breaths 2  Restlessness; nonpurposeful movements None  occas slight movement Frequent movement 0  Paradoxical breathing pattern: None  Present 0  Accessory muscle use: rise in clavicle during inspiration None Slight rise Pronounced rise 1  Grunting at end-expiration: guttural sound None  Present 0  Nasal flaring: involuntary movement of nares None  Present 2  Look of fear None  Eyes wide 0  Overall total out of 16    7    Respiratory Distress Observation Scale Journal of Palliative Medicine Vol. 13, Number 3, 2010 Campbell et al.    eICU Interventions  50% Face MAsk Moderate resp distress Acute Hypoxemic Resp Failure  This is despite systemic tPA 48h ago and now on BiPAP   PLAN Stat CXR Possible intubation Lasix  IV X 1 stat     Intervention Category Major Interventions: Respiratory failure - evaluation and management Evaluation Type: Other  Alyscia Carmon 02/11/2015, 5:39 AM

## 2015-02-12 ENCOUNTER — Inpatient Hospital Stay (HOSPITAL_COMMUNITY): Payer: Medicaid - Out of State

## 2015-02-12 DIAGNOSIS — R188 Other ascites: Secondary | ICD-10-CM | POA: Diagnosis present

## 2015-02-12 LAB — COMPREHENSIVE METABOLIC PANEL
ALBUMIN: 1.9 g/dL — AB (ref 3.5–5.0)
ALT: 15 U/L — ABNORMAL LOW (ref 17–63)
ANION GAP: 5 (ref 5–15)
AST: 45 U/L — ABNORMAL HIGH (ref 15–41)
Alkaline Phosphatase: 56 U/L (ref 38–126)
BILIRUBIN TOTAL: 2.2 mg/dL — AB (ref 0.3–1.2)
BUN: 14 mg/dL (ref 6–20)
CHLORIDE: 115 mmol/L — AB (ref 101–111)
CO2: 21 mmol/L — AB (ref 22–32)
Calcium: 8 mg/dL — ABNORMAL LOW (ref 8.9–10.3)
Creatinine, Ser: 1.12 mg/dL (ref 0.61–1.24)
GFR calc Af Amer: >60 mL/min (ref >60)
GFR calc non Af Amer: >60 mL/min (ref >60)
GLUCOSE: 98 mg/dL (ref 65–99)
POTASSIUM: 3.6 mmol/L (ref 3.5–5.1)
Sodium: 141 mmol/L (ref 135–145)
TOTAL PROTEIN: 6.4 g/dL — AB (ref 6.5–8.1)

## 2015-02-12 LAB — CBC
HEMATOCRIT: 26.3 % — AB (ref 39.0–52.0)
HEMATOCRIT: 22.9 % — AB (ref 39.0–52.0)
Hemoglobin: 8.4 g/dL — ABNORMAL LOW (ref 13.0–17.0)
Hemoglobin: 7.5 g/dL — ABNORMAL LOW (ref 13.0–17.0)
MCH: 28.2 pg (ref 26.0–34.0)
MCH: 28.8 pg (ref 26.0–34.0)
MCHC: 31.9 g/dL (ref 30.0–36.0)
MCHC: 32.8 g/dL (ref 30.0–36.0)
MCV: 88.3 fL (ref 78.0–100.0)
MCV: 88.1 fL (ref 78.0–100.0)
PLATELETS: 61 10*3/uL — AB (ref 150–400)
Platelets: 58 10*3/uL — ABNORMAL LOW (ref 150–400)
RBC: 2.98 MIL/uL — ABNORMAL LOW (ref 4.22–5.81)
RBC: 2.6 MIL/uL — ABNORMAL LOW (ref 4.22–5.81)
RDW: 17.8 % — AB (ref 11.5–15.5)
RDW: 17.8 % — AB (ref 11.5–15.5)
WBC: 13 10*3/uL — ABNORMAL HIGH (ref 4.0–10.5)
WBC: 10 10*3/uL (ref 4.0–10.5)

## 2015-02-12 LAB — GLUCOSE, CAPILLARY
GLUCOSE-CAPILLARY: 100 mg/dL — AB (ref 65–99)
GLUCOSE-CAPILLARY: 87 mg/dL (ref 65–99)
GLUCOSE-CAPILLARY: 92 mg/dL (ref 65–99)
GLUCOSE-CAPILLARY: 96 mg/dL (ref 65–99)
Glucose-Capillary: 95 mg/dL (ref 65–99)
Glucose-Capillary: 83 mg/dL (ref 65–99)

## 2015-02-12 LAB — PREPARE CRYOPRECIPITATE: Unit division: 0

## 2015-02-12 LAB — APTT: aPTT: 48 seconds — ABNORMAL HIGH (ref 24–37)

## 2015-02-12 LAB — AMMONIA: AMMONIA: 85 umol/L — AB (ref 9–35)

## 2015-02-12 LAB — CULTURE, BLOOD (ROUTINE X 2): Culture: NO GROWTH

## 2015-02-12 LAB — PHOSPHORUS: Phosphorus: 3.3 mg/dL (ref 2.5–4.6)

## 2015-02-12 LAB — PROTIME-INR
INR: 2.22 — ABNORMAL HIGH (ref 0.00–1.49)
Prothrombin Time: 24.4 seconds — ABNORMAL HIGH (ref 11.6–15.2)

## 2015-02-12 LAB — CULTURE, BODY FLUID W GRAM STAIN -BOTTLE

## 2015-02-12 LAB — FIBRINOGEN: FIBRINOGEN: 123 mg/dL — AB (ref 204–475)

## 2015-02-12 LAB — CULTURE, BODY FLUID-BOTTLE

## 2015-02-12 LAB — MAGNESIUM: MAGNESIUM: 1.8 mg/dL (ref 1.7–2.4)

## 2015-02-12 LAB — TROPONIN I: Troponin I: <0.03 ng/mL (ref <0.031)

## 2015-02-12 MED ORDER — FENTANYL BOLUS VIA INFUSION
50.0000 ug | INTRAVENOUS | Status: DC | PRN
  Filled 2015-02-12: qty 50

## 2015-02-12 MED ORDER — SODIUM CHLORIDE 0.9 % IV SOLN: Freq: Once | INTRAVENOUS | Status: DC

## 2015-02-12 MED ORDER — SODIUM CHLORIDE 0.9 % IV SOLN
25.0000 ug/h | INTRAVENOUS | Status: DC
  Administered 2015-02-12: 100 ug/h via INTRAVENOUS
  Filled 2015-02-12: qty 50

## 2015-02-12 MED ORDER — FENTANYL CITRATE (PF) 100 MCG/2ML IJ SOLN: 50.0000 ug | Freq: Once | INTRAMUSCULAR | Status: DC

## 2015-02-12 MED ORDER — MIDAZOLAM HCL 2 MG/2ML IJ SOLN
2.0000 mg | INTRAMUSCULAR | Status: DC | PRN
  Administered 2015-02-12 – 2015-02-14 (×7): 2 mg via INTRAVENOUS
  Filled 2015-02-12 (×8): qty 2

## 2015-02-12 MED ORDER — MIDAZOLAM HCL 2 MG/2ML IJ SOLN: 2.0000 mg | INTRAMUSCULAR | Status: DC | PRN

## 2015-02-12 MED ORDER — FUROSEMIDE 10 MG/ML IJ SOLN
10.0000 mg | Freq: Once | INTRAMUSCULAR | Status: AC
  Administered 2015-02-12: 10 mg via INTRAVENOUS
  Filled 2015-02-12: qty 2

## 2015-02-12 NOTE — Progress Notes (Signed)
PULMONARY / CRITICAL CARE MEDICINE   Name: Francisco [name] MRN: [id number] DOB: May 28, 1966    ADMISSION DATE:  02/06/2015 CONSULTATION DATE:  02/06/15  REFERRING MD :  ED  CHIEF COMPLAINT:  Variceal Bleed & CHF  INITIAL PRESENTATION: Patient admitted with worsening dyspnea and lower extremity edema. Recently discharged from SNF after long hospitalization at OSH in [place] with GIB from esophageal varices post banding.  STUDIES:  Portable CXR 8/6 - Peribronchial cuffing. No consolidation or effusion. TTE 8/7 - EF 55-60%. No wall motion abnormality. Grade 1 diastolic dysfunction. Hazy density on AV.  SIGNIFICANT EVENTS: 8/6 - Admit to ICU 8/6 - Transfuse 1u PRBCs 8/6 - Elective intubation & Left IJ placed 8/6 - EGD w/o varices but w/ small peptic ulcers 8/7 - paracentesis 3.8 liters.  8/11 - slowly increasing vasopressor requirement  SUBJECTIVE:  Patient had pink tinged output during lactulose enema overnight. Fentanyl gtt was started overnight for "biting the tube" per bedside RN this morning. Patient comatose & nonresponsive now. Received 1 dose of Ativan in last 24 hours. Continues to require vasopressor support.  ROS:  Unobtainable as patient is intubated & sedated.  VITAL SIGNS: Temp:  [98.2 F (36.8 C)-99.1 F (37.3 C)] 99.1 F (37.3 C) (08/12 0900) Pulse Rate:  [106] 106 (08/12 0358) Resp:  [9-23] 13 (08/12 0900) BP: (90-116)/(60-78) 94/73 mmHg (08/12 0900) SpO2:  [100 %] 100 % (08/12 0900) FiO2 (%):  [30 %] 30 % (08/12 0825) Weight:  [243 lb 13.3 oz (110.6 kg)-244 lb 0.8 oz (110.7 kg)] 244 lb 0.8 oz (110.7 kg) (08/12 0412) HEMODYNAMICS:   VENTILATOR SETTINGS: Vent Mode:  [-] PRVC FiO2 (%):  [30 %] 30 % Set Rate:  [15 bmp] 15 bmp Vt Set:  [580 mL] 580 mL PEEP:  [5 cmH20] 5 cmH20 Plateau Pressure:  [7 cmH20-29 cmH20] 29 cmH20 INTAKE / OUTPUT:  Intake/Output Summary (Last 24 hours) at 02/12/15 0950 Last data filed at 02/12/15 0500  Gross per 24 hour  Intake  2488.25 ml  Output   1755 ml  Net 733.25 ml    PHYSICAL EXAMINATION: General:  Comatose. Doesn't open eyes. No distress. Neuro:  Sedate. Pupils pinpoint & equal. HEENT:  Tacky MM. ETT in place. No scleral injection.  Cardiovascular:  Reg rate. Worsening anasarca. Lungs:  Decreased aeration bilateral bases. Continuing on Vent.  Abdomen:  Protuberant but soft. Bowel sounds present with increasing activity. Musculoskeletal:  No joint deformity or effusion.  Skin:  Warm & dry. No rash. No cyanosis.  LABS:  CBC  Recent Labs Lab 02/11/15 0500 02/11/15 1847 02/12/15 0416  WBC 14.1* 12.0* 13.0*  HGB 9.2* 7.5* 8.4*  HCT 28.8* 23.5* 26.3*  PLT 75* 60* 61*   Coag's  Recent Labs Lab 02/10/15 0430 02/11/15 0500 02/12/15 0416  APTT 49* 50* 48*  INR 1.87* 2.12* 2.22*   BMET  Recent Labs Lab 02/10/15 0430 02/11/15 0500 02/12/15 0416  NA 142 141 141  K 3.0* 3.3* 3.6  CL 116* 115* 115*  CO2 21* 18* 21*  BUN 19 16 14   CREATININE 1.16 1.24 1.12  GLUCOSE 126* 108* 98   Electrolytes  Recent Labs Lab 02/10/15 0430 02/11/15 0500 02/12/15 0416  CALCIUM 7.6* 8.1* 8.0*  MG 1.7 2.0 1.8  PHOS 3.0 3.3 3.3   Sepsis Markers  Recent Labs Lab 02/06/15 0944 02/06/15 1830 02/07/15 0340  LATICACIDVEN 2.99* 3.1* 1.9   ABG  Recent Labs Lab 02/06/15 0856 02/06/15 1843  PHART 7.450 7.419  PCO2ART 33.1*  34.2*  PO2ART 119* 478*   Liver Enzymes  Recent Labs Lab 02/10/15 0430 02/11/15 0500 02/12/15 0416  AST 51* 54* 45*  ALT 17 15* 15*  ALKPHOS 49 58 56  BILITOT 2.2* 2.3* 2.2*  ALBUMIN 2.1* 2.1* 1.9*   Cardiac Enzymes  Recent Labs Lab 02/11/15 1309 02/11/15 1847 02/12/15 0007  TROPONINI <0.03 <0.03 <0.03   Glucose  Recent Labs Lab 02/11/15 0743 02/11/15 1147 02/11/15 1532 02/11/15 2021 02/12/15 0041 02/12/15 0404  GLUCAP 95 104* 99 89 87 92    Imaging Dg Abd Portable 1v  02/12/2015   CLINICAL DATA:  Ascites.  Ileus.  EXAM: PORTABLE ABDOMEN -  1 VIEW  COMPARISON:  02/09/2015 .  02/08/2015.  FINDINGS: NG tube noted coiled in stomach. Dilated loops of small large bowel noted consistent with adynamic ileus. Follow-up abdominal series suggested to demonstrate clearing and to exclude bowel obstruction. No free air. Probable ascites. No acute bony abnormality.  IMPRESSION: 1.  NG tube noted in good anatomic position.  2. Distended loops of small and large bowel consistent with adynamic ileus. Follow-up abdominal series suggested to demonstrate clearing and to exclude bowel obstruction.  3.  Probable ascites.   Electronically Signed   By: Maisie Fus  Register   On: 02/12/2015 07:10     ASSESSMENT / PLAN:  PULMONARY OETT 8/6>> A: Intubated for upper endoscopy electively - Mental status prohibits extubation. Low inspiratory volumes  P:   Wean FiO2 for Sat >92%. Plan for extubation once mental status improves Plan for paracentesis prior to extubation Holding Fentanyl gtt  CARDIOVASCULAR L IJ CVC 8/6>>8/10 PICC 8/10>> A:  Grade I diastolic dysfunction  Question AV Vegitation - TEE pending Hypotension - Secondary to sedation vs Septic Shock Bradycardia - Secondary to Precedex Prolonged QTc  P:  TTE for possible vegitation Hold Precedex Cont tele Wean Neo for MAP > 65 Checking Troponin I q6hr EKG today to monitor QTc  RENAL A:   Acute on Chronic RF - Unknown baseline creatinine. Previously on HD. UOP fluctuates. Lactic Acidosis - Resolving Hypokalemia - Resolved Hyperchloremia - Improving.  P:   Monitor UOP Neo to maintain MAP>65 Trend daily BUN/Creatinine Monitor electrolytes daily with labs Lasix  IV once between FFP infusions  GASTROINTESTINAL A:   GIB - Secondary to peptic ulcers. Has been taking Aleve. H/O Variceal Bleed - s/p OSH banding June 2016. EGD w/o varices Liver Cirrhosis w/ ascites - Plan for paracentesis prior to extubation SBP - Bacillus species Probable Ileus  P:   Lactulose enema tid Resume  Rifaximin as bowel function returns Suspect Ileus - OGT to lower intermittent suction GI following See ID for SBP Trending Coags & Fibrinogen daily for synthetic function Protonix IV bid Plan for paracentesis today & removal of ~ 2L of fluid to improve compliance & abdominal compliance   HEMATOLOGIC A:   Anemia - Secondary to GIB. Stable. No signs of active bleeding. Coagulopathy - Secondary to cirrhosis Thrombocytopenia - Mild & stable  P:  2u FFP today 1u Cryo on 8/11 Trending Coags daily Trending Hgb daily Transfuse for Hgb <7.0 or active bleeding SCDs for DVT prophylaxis No chemical DVT prophylaxis given GIB  INFECTIOUS A:   SBP - Bacillus species & Coag Neg Staph Possible Aortic Valve Vegitation  P:   Trending leukocyte count daily Monitoring for Fever TTE for possible AV vegitation Repeat cultures for any fever  BCx2 8/6>> Ascites 8/8>>>Bacillus species & Coag Neg Staph  Abx:  Rocephin, start date 8/6, day  6/x Vancomycin, 8/8>>8/11  ENDOCRINE A:   No acute issues.  P:   Accuchecks q4hr MD to be notified for BG <90 or >160 D5LR @ 50cc/hr  NEUROLOGIC A:   Acute encephalopathy - Hepatic vs Medication Induced. Fentanyl gtt overnight.  P:   RASS goal: 0 to -1 PRN Fentanyl PRN Ativan D/C Fentanyl gtt Lactulose enema tid Continue Thiamine IV daily Consider CT if no improvement in mental status with holding Fentanyl gtt  FAMILY  - Updates: Wife & other family to be updated once they return to bedside this morning.  - Inter-disciplinary family meet or Palliative Care meeting due by:  8/13   TODAY'S SUMMARY: Continuing Lactulose enemas for hepatic encephalopathy. TEE ordered & pending. If patient is marginal with mental status consider CT head. Paracentesis today to improve abdominal compliance. Correcting coagulopathy w/ FFP. Continue to wean vasopressors & limit sedation.   I have spent a total of 33 minutes of critical care time today caring for  this patient and reviewing his EMR.  Donna Christen Jamison Neighbor, M.D. [facility] Pulmonary & Critical Care Pager:  [phone] After 3pm or if no response, call [phone] 02/12/2015, 9:50 AM

## 2015-02-12 NOTE — Procedures (Signed)
_Paracentesis Procedure Note  Pre-operative Diagnosis: ascites  Post-operative Diagnosis: same  Indications: to improve abd compliance   Procedure Details  Consent: Informed consent was obtained. Risks of the procedure were discussed including: infection, bleeding, pain, pneumothorax.  Under sterile conditions the patient was positioned. Betadine solution and sterile drapes were utilized.  1% buffered lidocaine was used to anesthetize the peritoneal space. Fluid was obtained without any difficulties and minimal blood loss.  A dressing was applied to the wound and wound care instructions were provided.   Findings 2000 ml of cloudy ascites fluid was obtained. Complications:  None; patient tolerated the procedure well.          Condition: stable Pt tolerated well No samples sent Therapeutic only   Simonne Martinet ACNP-BC [facility] Pulmonary/Critical Care Pager # [phone] OR # [phone] if no answer

## 2015-02-12 NOTE — Progress Notes (Signed)
eLink Physician-Brief Progress Note Patient Name: Francisco [name] DOB: 1965-12-30 MRN: [id number]   Date of Service  02/12/2015  HPI/Events of Note  Patient received enemas and bath and has agitation despite PRN meds  eICU Interventions  Will start fentanyl infusion and PRN versed     Intervention Category Minor Interventions: Agitation / anxiety - evaluation and management  Erin Fulling 02/12/2015, 12:08 AM

## 2015-02-12 NOTE — Progress Notes (Signed)
On call MD notified of change in pt stool/output color. No further order given. MD said to page again if signs of active bleeding.

## 2015-02-12 NOTE — Progress Notes (Signed)
While giving pt scheduled enema about (500cc were infused), there was a change in output color from clear to slightly pink tinged. No force was felt while pushing and pt VS remained stable although HR elevated from low 100's to 130's. With change of color I immeadiately stopped giving the enema therefore pt only received 500 out of the 1000 ccs that were supposed to be infused. Within 10 minutes the output turned to a darker pink tinged color. On call GI Dr. Has been paged. Currently awaiting return page.

## 2015-02-12 NOTE — Progress Notes (Signed)
Progress Note   Subjective   Sedated   Objective   Vital signs in last 24 hours: Temp:  [98.2 F (36.8 C)-99.1 F (37.3 C)] 99.1 F (37.3 C) (08/12 0900) Pulse Rate:  [106] 106 (08/12 0358) Resp:  [9-23] 13 (08/12 0900) BP: (90-116)/(60-78) 94/73 mmHg (08/12 0900) SpO2:  [100 %] 100 % (08/12 0900) FiO2 (%):  [30 %] 30 % (08/12 0825) Weight:  [243 lb 13.3 oz (110.6 kg)-244 lb 0.8 oz (110.7 kg)] 244 lb 0.8 oz (110.7 kg) (08/12 0412) Last BM Date: 02/11/15 General:    Black male in NAD Lungs: On vent Abdomen:  Soft, largely distended, tympanitic. Extremities:  BLE pitting edema. Neurologic:  sedated   Intake/Output from previous day: 08/11 0701 - 08/12 0700 In: 2716.7 [I.V.:2106.7; Blood:120; IV Piggyback:350] Out: 1865 [Urine:615; Emesis/NG output:550; Stool:700] Intake/Output this shift:    Lab Results:  Recent Labs  02/11/15 0500 02/11/15 1847 02/12/15 0416  WBC 14.1* 12.0* 13.0*  HGB 9.2* 7.5* 8.4*  HCT 28.8* 23.5* 26.3*  PLT 75* 60* 61*   BMET  Recent Labs  02/10/15 0430 02/11/15 0500 02/12/15 0416  NA 142 141 141  K 3.0* 3.3* 3.6  CL 116* 115* 115*  CO2 21* 18* 21*  GLUCOSE 126* 108* 98  BUN 19 16 14   CREATININE 1.16 1.24 1.12  CALCIUM 7.6* 8.1* 8.0*   LFT  Recent Labs  02/12/15 0416  PROT 6.4*  ALBUMIN 1.9*  AST 45*  ALT 15*  ALKPHOS 56  BILITOT 2.2*   PT/INR  Recent Labs  02/11/15 0500 02/12/15 0416  LABPROT 23.6* 24.4*  INR 2.12* 2.22*    Studies/Results:  Dg Abd Portable 1v  02/12/2015   CLINICAL DATA:  Ascites.  Ileus.  EXAM: PORTABLE ABDOMEN - 1 VIEW  COMPARISON:  02/09/2015 .  02/08/2015.  FINDINGS: NG tube noted coiled in stomach. Dilated loops of small large bowel noted consistent with adynamic ileus. Follow-up abdominal series suggested to demonstrate clearing and to exclude bowel obstruction. No free air. Probable ascites. No acute bony abnormality.  IMPRESSION: 1.  NG tube noted in good anatomic position.  2.  Distended loops of small and large bowel consistent with adynamic ileus. Follow-up abdominal series suggested to demonstrate clearing and to exclude bowel obstruction.  3.  Probable ascites.   Electronically Signed   By: Maisie Fus  Register   On: 02/12/2015 07:10     Assessment / Plan:   1. GI bleed with acute blood loss anemia. EGD revealed GE junction ulcerations, probably from previous band ligation of varices in [place]. Hgb stable. On BID PPI.   Some pink tinged output with lactulose enemas during the night but hgb stable. Brown stool in flexiseal  2. Decompensated cirrhosis wiith ascites and coagulopathy. INR up to 2.2, s/p FFP today. Also, s;p cryoprecipitate for low fibrinogen  3.GPC clusters and G+ rods on ascites gramstain - ? Contaminant as only 44 WBC on cell count - covered w/ Abx Staph coag neg and Bacillus  4. Altered mental status / prob multifactorial with hepatic enceph contributing. Back on fentanyl infusion for agitation during the night. Unable to be extubated because of mental status. Wlll likely need repeat paracentesis prior t extubation. Getting Lactulose enemas. Restart PO lactulose and Xifaxan when able.   5. Diffuse ileus. . No significant NGT output (only yesterday) Critical illness, sedatives, immobility likely all contributing to ileus.  6. Hypotension, septic shock vrs sedation. For TTE for evaluate for possible valve vegetation.  LOS: 6 days   Willette Cluster  02/12/2015, 9:29 AM   Agree with Ms. Dorice Lamas assessment and plan.  PCCM management is appropriate - I do not have anything to add so will sign off and we can come back if needed.  Iva Boop, MD, [facility]

## 2015-02-13 ENCOUNTER — Inpatient Hospital Stay (HOSPITAL_COMMUNITY): Payer: Medicaid - Out of State

## 2015-02-13 DIAGNOSIS — G9340 Encephalopathy, unspecified: Secondary | ICD-10-CM | POA: Diagnosis present

## 2015-02-13 DIAGNOSIS — J9601 Acute respiratory failure with hypoxia: Secondary | ICD-10-CM | POA: Diagnosis present

## 2015-02-13 DIAGNOSIS — J9600 Acute respiratory failure, unspecified whether with hypoxia or hypercapnia: Secondary | ICD-10-CM

## 2015-02-13 LAB — COMPREHENSIVE METABOLIC PANEL
ALT: 14 U/L — AB (ref 17–63)
ANION GAP: 6 (ref 5–15)
AST: 40 U/L (ref 15–41)
Albumin: 1.9 g/dL — ABNORMAL LOW (ref 3.5–5.0)
Alkaline Phosphatase: 57 U/L (ref 38–126)
BILIRUBIN TOTAL: 2.4 mg/dL — AB (ref 0.3–1.2)
BUN: 12 mg/dL (ref 6–20)
CO2: 22 mmol/L (ref 22–32)
Calcium: 7.9 mg/dL — ABNORMAL LOW (ref 8.9–10.3)
Chloride: 114 mmol/L — ABNORMAL HIGH (ref 101–111)
Creatinine, Ser: 1.09 mg/dL (ref 0.61–1.24)
GFR calc non Af Amer: >60 mL/min (ref >60)
Glucose, Bld: 87 mg/dL (ref 65–99)
Potassium: 3.2 mmol/L — ABNORMAL LOW (ref 3.5–5.1)
Sodium: 142 mmol/L (ref 135–145)
TOTAL PROTEIN: 5.6 g/dL — AB (ref 6.5–8.1)

## 2015-02-13 LAB — GLUCOSE, CAPILLARY
GLUCOSE-CAPILLARY: 82 mg/dL (ref 65–99)
Glucose-Capillary: 78 mg/dL (ref 65–99)
Glucose-Capillary: 84 mg/dL (ref 65–99)
Glucose-Capillary: 92 mg/dL (ref 65–99)
Glucose-Capillary: 76 mg/dL (ref 65–99)
Glucose-Capillary: 88 mg/dL (ref 65–99)

## 2015-02-13 LAB — CATH TIP CULTURE: Culture: NO GROWTH

## 2015-02-13 LAB — APTT: APTT: 52 s — AB (ref 24–37)

## 2015-02-13 LAB — CBC
HCT: 24 % — ABNORMAL LOW (ref 39.0–52.0)
HEMATOCRIT: 25 % — AB (ref 39.0–52.0)
HEMOGLOBIN: 7.7 g/dL — AB (ref 13.0–17.0)
HEMOGLOBIN: 7.8 g/dL — AB (ref 13.0–17.0)
MCH: 28.7 pg (ref 26.0–34.0)
MCH: 27.8 pg (ref 26.0–34.0)
MCHC: 32.1 g/dL (ref 30.0–36.0)
MCHC: 31.2 g/dL (ref 30.0–36.0)
MCV: 89.6 fL (ref 78.0–100.0)
MCV: 89 fL (ref 78.0–100.0)
PLATELETS: 63 10*3/uL — AB (ref 150–400)
Platelets: 56 10*3/uL — ABNORMAL LOW (ref 150–400)
RBC: 2.68 MIL/uL — ABNORMAL LOW (ref 4.22–5.81)
RBC: 2.81 MIL/uL — ABNORMAL LOW (ref 4.22–5.81)
RDW: 17.9 % — ABNORMAL HIGH (ref 11.5–15.5)
RDW: 17.5 % — ABNORMAL HIGH (ref 11.5–15.5)
WBC: 11.1 10*3/uL — AB (ref 4.0–10.5)
WBC: 12 10*3/uL — AB (ref 4.0–10.5)

## 2015-02-13 LAB — MAGNESIUM: Magnesium: 1.6 mg/dL — ABNORMAL LOW (ref 1.7–2.4)

## 2015-02-13 LAB — PHOSPHORUS: PHOSPHORUS: 3.2 mg/dL (ref 2.5–4.6)

## 2015-02-13 LAB — FIBRINOGEN: Fibrinogen: 108 mg/dL — ABNORMAL LOW (ref 204–475)

## 2015-02-13 LAB — PROTIME-INR
INR: 2.31 — ABNORMAL HIGH (ref 0.00–1.49)
Prothrombin Time: 25.1 seconds — ABNORMAL HIGH (ref 11.6–15.2)

## 2015-02-13 MED ORDER — MAGNESIUM SULFATE 2 GM/50ML IV SOLN
2.0000 g | Freq: Once | INTRAVENOUS | Status: AC
  Administered 2015-02-13: 2 g via INTRAVENOUS
  Filled 2015-02-13: qty 50

## 2015-02-13 MED ORDER — POTASSIUM CHLORIDE 10 MEQ/50ML IV SOLN
10.0000 meq | INTRAVENOUS | Status: AC
  Administered 2015-02-13 (×4): 10 meq via INTRAVENOUS
  Filled 2015-02-13 (×4): qty 50

## 2015-02-13 NOTE — Progress Notes (Signed)
PULMONARY / CRITICAL CARE MEDICINE   Name: Francisco [name] MRN: [id number] DOB: [birth year]    ADMISSION DATE:  02/06/2015 CONSULTATION DATE:  02/06/15  REFERRING MD :  ED  CHIEF COMPLAINT:  Variceal Bleed & CHF  INITIAL PRESENTATION: Patient admitted with worsening dyspnea and lower extremity edema. Recently discharged from SNF after long hospitalization at OSH in [place] with GIB from esophageal varices post banding.  STUDIES:  Portable CXR 8/6 - Peribronchial cuffing. No consolidation or effusion. TTE 8/7 - EF 55-60%. No wall motion abnormality. Grade 1 diastolic dysfunction. Hazy density on AV.  SIGNIFICANT EVENTS: 8/6 - Admit to ICU 8/6 - Transfuse 1u PRBCs 8/6 - Elective intubation & Left IJ placed 8/6 - EGD w/o varices but w/ small peptic ulcers 8/7 - paracentesis 3.8 liters.  8/11 - slowly increasing vasopressor requirement 8/12: Patient had pink tinged output during lactulose enema overnight. Fentanyl gtt was started overnight for "biting the tube" per bedside RN this morning. Patient comatose & nonresponsive now. Received 1 dose of Ativan in last 24 hours. Continues to require vasopressor support.   SUBJECTIVE/OVERNIGHT/INTERVAL HX 02/13/15: Unresponsivemost of the time and mostly breathes with vent per RN. S/pParacentesis 2L  yesterday and now with significant leak at puncture site - has ostomy bag. Not actively bleeing. Not on sedation. On neo with MAP> 65 and sbp 86. Ongoing adynamc ileus +   VITAL SIGNS: Temp:  [98.4 F (36.9 C)-99.3 F (37.4 C)] 99 F (37.2 C) (08/13 1000) Resp:  [6-22] 15 (08/13 1000) BP: (85-121)/(45-102) 89/56 mmHg (08/13 1000) SpO2:  [96 %-100 %] 100 % (08/13 1000) FiO2 (%):  [30 %] 30 % (08/13 1000) Weight:  [110.8 kg (244 lb 4.3 oz)] 110.8 kg (244 lb 4.3 oz) (08/13 0317) HEMODYNAMICS:   VENTILATOR SETTINGS: Vent Mode:  [-] PRVC FiO2 (%):  [30 %] 30 % Set Rate:  [15 bmp] 15 bmp Vt Set:  [580 mL] 580 mL PEEP:  [5 cmH20] 5  cmH20 Pressure Support:  [10 cmH20] 10 cmH20 Plateau Pressure:  [12 cmH20-21 cmH20] 21 cmH20 INTAKE / OUTPUT:  Intake/Output Summary (Last 24 hours) at 02/13/15 1113 Last data filed at 02/13/15 1000  Gross per 24 hour  Intake 3232.46 ml  Output   2165 ml  Net 1067.46 ml    PHYSICAL EXAMINATION: General:  Comatose. Doesn't open eyes. No distress. Neuro:  Sedate. Pupils pinpoint & equal. HEENT:  Tacky MM. ETT in place. No scleral injection.  Cardiovascular:  Reg rate. Worsening anasarca. Lungs:  Decreased aeration bilateral bases. Continuing on Vent.  Abdomen:  Protuberant but soft. Bowel sounds present with increasing activity. Ostomy bag RLQ - draining ascites + Musculoskeletal:  No joint deformity or effusion.  Skin:  Warm & dry. No rash. No cyanosis.  LABS:   PULMONARY  Recent Labs Lab 02/06/15 1843  PHART 7.419  PCO2ART 34.2*  PO2ART 478*  HCO3 21.7  TCO2 20.7  O2SAT 100.0    CBC  Recent Labs Lab 02/12/15 0416 02/12/15 1700 02/13/15 0320  HGB 8.4* 7.5* 7.7*  HCT 26.3* 22.9* 24.0*  WBC 13.0* 10.0 11.1*  PLT 61* 58* 56*    COAGULATION  Recent Labs Lab 02/09/15 0550 02/10/15 0430 02/11/15 0500 02/12/15 0416 02/13/15 0320  INR 1.72* 1.87* 2.12* 2.22* 2.31*    CARDIAC   Recent Labs Lab 02/11/15 1309 02/11/15 1847 02/12/15 0007  TROPONINI <0.03 <0.03 <0.03   No results for input(s): PROBNP in the last 168 hours.   CHEMISTRY  Recent Labs Lab  02/09/15 0550 02/10/15 0430 02/11/15 0500 02/12/15 0416 02/13/15 0320  NA 141 142 141 141 142  K 3.3* 3.0* 3.3* 3.6 3.2*  CL 114* 116* 115* 115* 114*  CO2 22 21* 18* 21* 22  GLUCOSE 119* 126* 108* 98 87  BUN 26* CREATININE 1.58* 1.16 1.24 1.12 1.09  CALCIUM 8.0* 7.6* 8.1* 8.0* 7.9*  MG 1.8 1.7 2.0 1.8 1.6*  PHOS 3.9 3.0 3.3 3.3 3.2   Estimated Creatinine Clearance: 99.9 mL/min (by C-G formula based on Cr of 1.09).   LIVER  Recent Labs Lab 02/09/15 0550 02/10/15 0430  02/11/15 0500 02/12/15 0416 02/13/15 0320  AST 51* 51* 54* 45* 40  ALT 16* 17 15* 15* 14*  ALKPHOS 58 49 58 56 57  BILITOT 2.4* 2.2* 2.3* 2.2* 2.4*  PROT 7.0 6.0* 6.5 6.4* 5.6*  ALBUMIN 2.5* 2.1* 2.1* 1.9* 1.9*  INR 1.72* 1.87* 2.12* 2.22* 2.31*     INFECTIOUS  Recent Labs Lab 02/06/15 1830 02/07/15 0340  LATICACIDVEN 3.1* 1.9     ENDOCRINE CBG (last 3)   Recent Labs  02/13/15 0043 02/13/15 0451 02/13/15 0758  GLUCAP 78 84 92         IMAGING x48h  - image(s) personally visualized  -   highlighted in bold Dg Abd Portable 1v  02/12/2015   CLINICAL DATA:  Ascites.  Ileus.  EXAM: PORTABLE ABDOMEN - 1 VIEW  COMPARISON:  02/09/2015 .  02/08/2015.  FINDINGS: NG tube noted coiled in stomach. Dilated loops of small large bowel noted consistent with adynamic ileus. Follow-up abdominal series suggested to demonstrate clearing and to exclude bowel obstruction. No free air. Probable ascites. No acute bony abnormality.  IMPRESSION: 1.  NG tube noted in good anatomic position.  2. Distended loops of small and large bowel consistent with adynamic ileus. Follow-up abdominal series suggested to demonstrate clearing and to exclude bowel obstruction.  3.  Probable ascites.   Electronically Signed   By: Maisie Fus  Register   On: 02/12/2015 07:10        ASSESSMENT / PLAN:  PULMONARY OETT 8/6>> A: Intubated for upper endoscopy electively - Mental status prohibits extubation.   - unchanged  P:   Full vent support Plan for extubation once mental status improves Plan for paracentesis prior to extubation Holding Fentanyl gtt  CARDIOVASCULAR L IJ CVC 8/6>>8/10 PICC 8/10>> A:  Grade I diastolic dysfunction  Question AV Vegitation - TEE pending Hypotension - Secondary to sedation vs Septic Shock Bradycardia - Secondary to Precedex Prolonged QTc   - on low dose pressor   P:  TTE for possible vegitation Cont tele Wean Neo for MAP > 65   RENAL  Recent Labs Lab  02/09/15 0550 02/10/15 0430 02/11/15 0500 02/12/15 0416 02/13/15 0320  NA 141 142 141 141 142  K 3.3* 3.0* 3.3* 3.6 3.2*  CL 114* 116* 115* 115* 114*  CO2 22 21* 18* 21* 22  GLUCOSE 119* 126* 108* 98 87  BUN 26* CREATININE 1.58* 1.16 1.24 1.12 1.09  CALCIUM 8.0* 7.6* 8.1* 8.0* 7.9*  MG 1.8 1.7 2.0 1.8 1.6*  PHOS 3.9 3.0 3.3 3.3 3.2    A:   Acute on Chronic RF - Unknown baseline creatinine. Previously on HD. UOP fluctuates.  - resolvede Lactic Acidosis - Resolved   - mild hypomag  P:   Monitor UOP Neo to maintain MAP>65 Trend daily BUN/Creatinine Monitor electrolytes daily with labs   GASTROINTESTINAL  A:   GIB - Secondary to peptic ulcers. Has been taking Aleve. H/O Variceal Bleed - s/p OSH banding June 2016. 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 RN reporting onmy some OG returns. Ascites leak  P:   AXR 02/13/2015 and if ok then start lactulose and xifaxan through NG Lactulose enema tid Resume Rifaximin as bowel function returns Suspect Ileus - OGT to lower intermittent suction see ID for SBP Trending Coags & Fibrinogen ly for synthetic function Protonix IV bid Monitor ascites leak   HEMATOLOGIC  Recent Labs Lab 02/12/15 0416 02/12/15 1700 02/13/15 0320  HGB 8.4* 7.5* 7.7*  HCT 26.3* 22.9* 24.0*  WBC 13.0* 10.0 11.1*  PLT 61* 58* 56*    Recent Labs Lab 02/09/15 0550 02/10/15 0430 02/11/15 0500 02/12/15 0416 02/13/15 0320  INR 1.72* 1.87* 2.12* 2.22* 2.31*     A:   Anemia - Secondary to GIB. Stable. No signs of active bleeding. Coagulopathy - Secondary to cirrhosis Thrombocytopenia - Mild & stable  - unchanged  P:  Transfuse for Hgb <7.0 or active bleeding SCDs for DVT prophylaxis No chemical DVT prophylaxis given GIB  INFECTIOUS A:   SBP - Bacillus species & Coag Neg  Staph Possible Aortic Valve Vegitation  P:   Trending leukocyte count daily Monitoring for Fever TTE for possible AV vegitation Repeat cultures for any fever  BCx2 8/6>> Ascites 8/8>>>Bacillus species & Coag Neg Staph  Abx:  Rocephin, start date 8/6, day 6/x Vancomycin, 8/8>>8/11  ENDOCRINE A:   No acute issues.  P:   Accuchecks q4hr MD to be notified for BG <90 or >160 D5LR @ 50cc/hr  NEUROLOGIC A:   Acute encephalopathy - Hepatic vs Medication Induced. Fentanyl gtt overnight.   - off fent gtt x 24h. STill RASS -4  P:   CT head Recheck Ammonia 02/14/15 Aim to get lactulose via enteral route RASS goal: 0 to -1 PRN Fentanyl PRN versed Lactulose enema tid Continue Thiamine IV daily   FAMILY  - Updates: Wife & other family tnot present at bedside 02/13/2015  - Inter-disciplinary family meet or Palliative Care meeting due by:  8/13   TODAY'S SUMMARY:   Having ascites leak. Still unrespinsive. Get CT head. Check AXR and if ok: change lactulose enema to po and add xifax     The patient is critically ill with multiple organ systems failure and requires high complexity decision making for assessment and support, frequent evaluation and titration of therapies, application of advanced monitoring technologies and extensive interpretation of multiple databases.   Critical Care Time devoted to patient care services described in this note is  30  Minutes. This time reflects time of care of this signee Dr Kalman Shan. This critical care time does not reflect procedure time, or teaching time or supervisory time of PA/NP/Med student/Med Resident etc but could involve care discussion time    Dr. Kalman Shan, M.D., [facility]C.P Pulmonary and Critical Care Medicine Staff Physician [facility] Pager: [phone], If no answer or between  15:00h - 7:00h: call [phone]  02/13/2015 11:29 AM

## 2015-02-13 NOTE — Progress Notes (Signed)
Last noted ABG result on 02/06/15. MD made aware. No new orders.

## 2015-02-13 NOTE — Progress Notes (Signed)
Paracentesis site of right of abdomen had been leaking overnight. Colostomy pouch put over site to collect output. 550cc of cloudy, yellow fluid emptied from pouch.

## 2015-02-13 NOTE — Progress Notes (Signed)
[facility] ADULT ICU REPLACEMENT PROTOCOL FOR AM LAB REPLACEMENT ONLY  The patient does apply for the [facility] Adult ICU Electrolyte Replacment Protocol based on the criteria listed below:   1. Is GFR >/= 40 ml/min? Yes.    Patient's GFR today is >60 2. Is urine output >/= 0.5 ml/kg/hr for the last 6 hours? Yes.   Patient's UOP is 0.6 ml/kg/hr 3. Is BUN < 60 mg/dL? Yes.    Patient's BUN today is 12 4. Abnormal electrolyte(s): Potassium=3.2, Magnesium=1.6 5. Ordered repletion with: [facility] adult ICU replacement protocol 6. If a panic level lab has been reported, has the CCM MD in charge been notified? Yes.  .   Physician:  Dr. Cyril Mourning  [facility], Alda Berthold E 02/13/2015 6:13 AM

## 2015-02-14 ENCOUNTER — Inpatient Hospital Stay (HOSPITAL_COMMUNITY): Payer: Medicaid - Out of State

## 2015-02-14 LAB — COMPREHENSIVE METABOLIC PANEL
ALT: 15 U/L — AB (ref 17–63)
AST: 39 U/L (ref 15–41)
Albumin: 1.7 g/dL — ABNORMAL LOW (ref 3.5–5.0)
Alkaline Phosphatase: 53 U/L (ref 38–126)
Anion gap: 4 — ABNORMAL LOW (ref 5–15)
BUN: 9 mg/dL (ref 6–20)
CALCIUM: 7.6 mg/dL — AB (ref 8.9–10.3)
CO2: 22 mmol/L (ref 22–32)
Chloride: 115 mmol/L — ABNORMAL HIGH (ref 101–111)
Creatinine, Ser: 0.95 mg/dL (ref 0.61–1.24)
GFR calc Af Amer: >60 mL/min (ref >60)
GLUCOSE: 95 mg/dL (ref 65–99)
Potassium: 3 mmol/L — ABNORMAL LOW (ref 3.5–5.1)
Sodium: 141 mmol/L (ref 135–145)
Total Bilirubin: 2.5 mg/dL — ABNORMAL HIGH (ref 0.3–1.2)
Total Protein: 5.5 g/dL — ABNORMAL LOW (ref 6.5–8.1)

## 2015-02-14 LAB — GLUCOSE, CAPILLARY
GLUCOSE-CAPILLARY: 75 mg/dL (ref 65–99)
Glucose-Capillary: 75 mg/dL (ref 65–99)
Glucose-Capillary: 84 mg/dL (ref 65–99)
Glucose-Capillary: 84 mg/dL (ref 65–99)
Glucose-Capillary: 87 mg/dL (ref 65–99)
Glucose-Capillary: 90 mg/dL (ref 65–99)
Glucose-Capillary: 92 mg/dL (ref 65–99)

## 2015-02-14 LAB — PREPARE FRESH FROZEN PLASMA
Unit division: 0
Unit division: 0

## 2015-02-14 LAB — PROTIME-INR
INR: 2.49 — AB (ref 0.00–1.49)
PROTHROMBIN TIME: 26.6 s — AB (ref 11.6–15.2)

## 2015-02-14 LAB — CBC
HEMATOCRIT: 24.1 % — AB (ref 39.0–52.0)
HEMOGLOBIN: 7.8 g/dL — AB (ref 13.0–17.0)
MCH: 28.8 pg (ref 26.0–34.0)
MCHC: 32.4 g/dL (ref 30.0–36.0)
MCV: 88.9 fL (ref 78.0–100.0)
PLATELETS: 63 10*3/uL — AB (ref 150–400)
RBC: 2.71 MIL/uL — AB (ref 4.22–5.81)
RDW: 17.5 % — AB (ref 11.5–15.5)
WBC: 11 10*3/uL — AB (ref 4.0–10.5)

## 2015-02-14 LAB — MAGNESIUM: Magnesium: 1.6 mg/dL — ABNORMAL LOW (ref 1.7–2.4)

## 2015-02-14 LAB — LACTIC ACID, PLASMA: Lactic Acid, Venous: 1.3 mmol/L (ref 0.5–2.0)

## 2015-02-14 LAB — APTT: aPTT: 53 seconds — ABNORMAL HIGH (ref 24–37)

## 2015-02-14 LAB — PHOSPHORUS: Phosphorus: 2.9 mg/dL (ref 2.5–4.6)

## 2015-02-14 LAB — AMMONIA: Ammonia: 62 umol/L — ABNORMAL HIGH (ref 9–35)

## 2015-02-14 LAB — FIBRINOGEN: Fibrinogen: 88 mg/dL — CL (ref 204–475)

## 2015-02-14 MED ORDER — CHLORHEXIDINE GLUCONATE 0.12 % MT SOLN
OROMUCOSAL | Status: AC
  Administered 2015-02-14: 15 mL via OROMUCOSAL
  Filled 2015-02-14: qty 15

## 2015-02-14 MED ORDER — POTASSIUM CHLORIDE 10 MEQ/50ML IV SOLN
10.0000 meq | INTRAVENOUS | Status: AC
  Administered 2015-02-14 (×6): 10 meq via INTRAVENOUS
  Filled 2015-02-14 (×6): qty 50

## 2015-02-14 MED ORDER — LACTULOSE 10 GM/15ML PO SOLN
10.0000 g | Freq: Three times a day (TID) | ORAL | Status: DC
  Administered 2015-02-14 – 2015-02-26 (×33): 10 g via ORAL
  Filled 2015-02-14 (×37): qty 15

## 2015-02-14 MED ORDER — VITAL AF 1.2 CAL PO LIQD
1000.0000 mL | ORAL | Status: DC
  Administered 2015-02-14 – 2015-02-15 (×2): 1000 mL
  Filled 2015-02-14 (×3): qty 1000

## 2015-02-14 MED ORDER — POTASSIUM CHLORIDE 10 MEQ/50ML IV SOLN
INTRAVENOUS | Status: AC
  Filled 2015-02-14: qty 50

## 2015-02-14 MED ORDER — VITAL HIGH PROTEIN PO LIQD: 1000.0000 mL | ORAL | Status: DC

## 2015-02-14 MED ORDER — MAGNESIUM SULFATE 2 GM/50ML IV SOLN
2.0000 g | Freq: Once | INTRAVENOUS | Status: AC
  Administered 2015-02-14: 2 g via INTRAVENOUS
  Filled 2015-02-14: qty 50

## 2015-02-14 NOTE — Progress Notes (Signed)
Nutrition Follow-up  DOCUMENTATION CODES:   Obesity unspecified  INTERVENTION:   Initiate trickle feeds of Vital AF 1.2 @ 10 ml/hr via OGT. Provides 288 kcal and 18g of protein. RD to follow-up 8/15 for plan and advancement.  Goal: Vital AF 1.2 at 30 ml/hr. 60 ml Prostat TID.    Tube feeding regimen provides 1464 kcal (70% of needs), 144 grams of protein, and 584 ml of H2O.    NUTRITION DIAGNOSIS:   Inadequate oral intake related to inability to eat as evidenced by NPO status.  GOAL:   Provide needs based on ASPEN/SCCM guidelines  MONITOR:   Vent status, Labs, Weight trends, TF tolerance, Skin, I & O's  REASON FOR ASSESSMENT:   Consult Enteral/tube feeding initiation and management (Trickle feeds)  ASSESSMENT:   Patient admitted with worsening dyspnea and lower extremity edema. Recently discharged from SNF after long hospitalization at OSH in [place] with GIB from esophageal varices post banding.  Pt has been intubated since 8/06 (8 days). RD to start trickle feeds today. Will continue to monitor for plan and advancement of trickle feeds.  Patient is currently intubated on ventilator support MV: 8.8 L/min Temp (24hrs), Avg:98.7 F (37.1 C), Min:97.6 F (36.4 C), Max:99.1 F (37.3 C)  Propofol: none  Labs reviewed: Low K, Mg Phos WNL  Diet Order:  Diet NPO time specified  Skin:  Reviewed, no issues  Last BM:  8/12  Height:   Ht Readings from Last 1 Encounters:  02/13/15  (1.778 m)    Weight:   Wt Readings from Last 1 Encounters:  02/14/15 237 lb 3.4 oz (107.6 kg)    Ideal Body Weight:  75.5 kg  BMI:  Body mass index is 34.04 kg/(m^2).  Estimated Nutritional Needs:   Kcal:  1610-9604  Protein:  150-160g  Fluid:  2.1L/day  EDUCATION NEEDS:   No education needs identified at this time  Tilda Franco, MS, RD, LDN Pager: [phone] After Hours Pager: [phone]

## 2015-02-14 NOTE — Progress Notes (Signed)
CRITICAL VALUE ALERT  Critical value received: fibrinogen  Date of notification:  02/14/2015  Time of notification:  0501  Critical value read back:Yes.    Nurse who received alert: Drue Stager, RN  MD notified (1st page):  Pasty Arch   Time of first page:  0501  MD notified (2nd page):  Time of second page:  Responding MD:  Pasty Arch  Time MD responded:  [phone]

## 2015-02-14 NOTE — Progress Notes (Signed)
eLink Physician-Brief Progress Note Patient Name: Francisco [name] DOB: 1965-09-28 MRN: [id number]   Date of Service  02/14/2015  HPI/Events of Note  Low K and mg  eICU Interventions  replaced     Intervention Category Minor Interventions: Electrolytes abnormality - evaluation and management  Henry Russel, P 02/14/2015, 6:22 AM

## 2015-02-14 NOTE — Progress Notes (Signed)
PULMONARY / CRITICAL CARE MEDICINE   Name: Francisco [name] MRN: [id number] DOB: 01/19/1966    ADMISSION DATE:  02/06/2015 CONSULTATION DATE:  02/06/15  REFERRING MD :  ED  CHIEF COMPLAINT:  Variceal Bleed & CHF  INITIAL PRESENTATION: Patient admitted with worsening dyspnea and lower extremity edema. Recently discharged from SNF after long hospitalization at OSH in [place] with GIB from esophageal varices post banding.  STUDIES:  Portable CXR 8/6 - Peribronchial cuffing. No consolidation or effusion. TTE 8/7 - EF 55-60%. No wall motion abnormality. Grade 1 diastolic dysfunction. Hazy density on AV.  SIGNIFICANT EVENTS: 8/6 - Admit to ICU 8/6 - Transfuse 1u PRBCs 8/6 - Elective intubation & Left IJ placed 8/6 - EGD w/o varices but w/ small peptic ulcers 8/7 - paracentesis 3.8 liters.  8/11 - slowly increasing vasopressor requirement 8/12: Patient had pink tinged output during lactulose enema overnight. Fentanyl gtt was started overnight for "biting the tube" per bedside RN this morning. Patient comatose & nonresponsive now. Received 1 dose of Ativan in last 24 hours. Continues to require vasopressor support. 02/13/15: Unresponsivemost of the time and mostly breathes with vent per RN. S/pParacentesis 2L  yesterday and now with significant leak at puncture site - has ostomy bag. Not actively bleeing. Not on sedation. On neo with MAP> 65 and sbp 86. Ongoing adynamc ileus +    SUBJECTIVE/OVERNIGHT/INTERVAL HX 02/14/15 - more responsive. RN thinks ileus improved. 1L ascites leak overnight through ostomy bag. Neo continues.   VITAL SIGNS: Temp:  [98.2 F (36.8 C)-99.1 F (37.3 C)] 99.1 F (37.3 C) (08/14 0900) Pulse Rate:  [87-111] 100 (08/14 0330) Resp:  [14-22] 16 (08/14 0900) BP: (73-108)/(37-72) 93/59 mmHg (08/14 0900) SpO2:  [98 %-100 %] 100 % (08/14 0900) FiO2 (%):  [30 %-100 %] 30 % (08/14 1124) Weight:  [107.6 kg (237 lb 3.4 oz)] 107.6 kg (237 lb 3.4 oz) (08/14  0426) HEMODYNAMICS:   VENTILATOR SETTINGS: Vent Mode:  [-] PRVC FiO2 (%):  [30 %-100 %] 30 % Set Rate:  [15 bmp] 15 bmp Vt Set:  [580 mL] 580 mL PEEP:  [5 cmH20] 5 cmH20 Plateau Pressure:  [14 cmH20-21 cmH20] 16 cmH20 INTAKE / OUTPUT:  Intake/Output Summary (Last 24 hours) at 02/14/15 1259 Last data filed at 02/14/15 0800  Gross per 24 hour  Intake 2291.29 ml  Output   3805 ml  Net -1513.71 ml    PHYSICAL EXAMINATION: General:  Critically ill looking. NO distress Neuro:  RASS -2 HEENT:  Tacky MM. ETT in place. No scleral injection.  Cardiovascular:  Reg rate. Anasarca. Lungs:  Decreased aeration bilateral bases. Continuing on Vent.  Abdomen:  Protuberant but soft. Bowel sounds present with increasing activity. Ostomy bag RLQ - draining ascites + Musculoskeletal:  No joint deformity or effusion.  Skin:  Warm & dry. No rash. No cyanosis.  LABS:   PULMONARY No results for input(s): PHART, PCO2ART, PO2ART, HCO3, TCO2, O2SAT in the last 168 hours.  Invalid input(s): PCO2, PO2  CBC  Recent Labs Lab 02/13/15 0320 02/13/15 1640 02/14/15 0400  HGB 7.7* 7.8* 7.8*  HCT 24.0* 25.0* 24.1*  WBC 11.1* 12.0* 11.0*  PLT 56* 63* 63*    COAGULATION  Recent Labs Lab 02/10/15 0430 02/11/15 0500 02/12/15 0416 02/13/15 0320 02/14/15 0400  INR 1.87* 2.12* 2.22* 2.31* 2.49*    CARDIAC    Recent Labs Lab 02/11/15 1309 02/11/15 1847 02/12/15 0007  TROPONINI <0.03 <0.03 <0.03   No results for input(s): PROBNP in the  last 168 hours.   CHEMISTRY  Recent Labs Lab 02/10/15 0430 02/11/15 0500 02/12/15 0416 02/13/15 0320 02/14/15 0400  NA 142 141 141 142 141  K 3.0* 3.3* 3.6 3.2* 3.0*  CL 116* 115* 115* 114* 115*  CO2 21* 18* 21* 22 22  GLUCOSE 126* 108* 98 87 95  BUN 19 16 14 12 9   CREATININE 1.16 1.24 1.12 1.09 0.95  CALCIUM 7.6* 8.1* 8.0* 7.9* 7.6*  MG 1.7 2.0 1.8 1.6* 1.6*  PHOS 3.0 3.3 3.3 3.2 2.9   Estimated Creatinine Clearance: 112.9 mL/min (by  C-G formula based on Cr of 0.95).   LIVER  Recent Labs Lab 02/10/15 0430 02/11/15 0500 02/12/15 0416 02/13/15 0320 02/14/15 0400  AST 51* 54* 45* 40 39  ALT 17 15* 15* 14* 15*  ALKPHOS 49 58 56 57 53  BILITOT 2.2* 2.3* 2.2* 2.4* 2.5*  PROT 6.0* 6.5 6.4* 5.6* 5.5*  ALBUMIN 2.1* 2.1* 1.9* 1.9* 1.7*  INR 1.87* 2.12* 2.22* 2.31* 2.49*     INFECTIOUS  Recent Labs Lab 02/14/15 0400  LATICACIDVEN 1.3     ENDOCRINE CBG (last 3)   Recent Labs  02/14/15 0411 02/14/15 0744 02/14/15 1209  GLUCAP 75 84 75         IMAGING x48h  - image(s) personally visualized  -   highlighted in bold Ct Head Wo Contrast  02/13/2015   CLINICAL DATA:  Acute encephalopathy  EXAM: CT HEAD WITHOUT CONTRAST  TECHNIQUE: Contiguous axial images were obtained from the base of the skull through the vertex without intravenous contrast.  COMPARISON:  None.  FINDINGS: The bony calvarium is intact. Mild atrophic changes are noted. No findings to suggest acute hemorrhage, acute infarction or space-occupying mass lesion are noted.  IMPRESSION: Mild atrophy.  No acute abnormality is noted.   Electronically Signed   By: Alcide Clever M.D.   On: 02/13/2015 15:39   Dg Chest Port 1 View  02/14/2015   CLINICAL DATA:  Acute encephalopathy  EXAM: PORTABLE CHEST - 1 VIEW  COMPARISON:  02/11/2015  FINDINGS: Cardiomediastinal silhouette is stable. Again noted NG tube and endotracheal tube unchanged in position. No acute infiltrate or pulmonary edema. Hypoinflation again noted.  IMPRESSION: No active disease. Stable support apparatus. Hypoinflation again noted.   Electronically Signed   By: Natasha Mead M.D.   On: 02/14/2015 09:04   Dg Abd Portable 1v  02/13/2015   CLINICAL DATA:  Abdominal distension.  EXAM: PORTABLE ABDOMEN - 1 VIEW  COMPARISON:  February 12, 2015.  FINDINGS: Stable dilated small bowel loops are seen in the central and right side of the abdomen most consistent with ileus. No significant colonic dilatation  is noted. No abnormal calcifications are noted.  IMPRESSION: Stable dilated small bowel loops are noted most consistent with ileus, but continued radiographic follow-up is recommended to rule out obstruction.   Electronically Signed   By: Lupita Raider, M.D.   On: 02/13/2015 12:07        ASSESSMENT / PLAN:  PULMONARY OETT 8/6>> A: Intubated for upper endoscopy electively - Mental status prohibits extubation.   -  P:   Full vent support Plan for extubation once mental status improves Plan for paracentesis prior to extubation Holding Fentanyl gtt  CARDIOVASCULAR L IJ CVC 8/6>>8/10 PICC 8/10>> A:  Grade I diastolic dysfunction  Question AV Vegitation - TEE pending Hypotension - Secondary to sedation vs Septic Shock Bradycardia - Secondary to Precedex Prolonged QTc   - on low dose  vasopressor   P:  TTE for possible vegitation Cont tele Wean Neo for MAP > 65   RENAL  Recent Labs Lab 02/10/15 0430 02/11/15 0500 02/12/15 0416 02/13/15 0320 02/14/15 0400  NA 142 141 141 142 141  K 3.0* 3.3* 3.6 3.2* 3.0*  CL 116* 115* 115* 114* 115*  CO2 21* 18* 21* 22 22  GLUCOSE 126* 108* 98 87 95  BUN 19 16 14 12 9   CREATININE 1.16 1.24 1.12 1.09 0.95  CALCIUM 7.6* 8.1* 8.0* 7.9* 7.6*  MG 1.7 2.0 1.8 1.6* 1.6*  PHOS 3.0 3.3 3.3 3.2 2.9    A:   Acute on Chronic RF - Unknown baseline creatinine. Previously on HD. UOP fluctuates.  - resolvede Lactic Acidosis - Resolved   - mild hypomag and mild hypokalemia  P:   Monitor UOP Neo to maintain MAP>65 Trend daily BUN/Creatinine Monitor electrolytes daily with labs   GASTROINTESTINAL A:   GIB - Secondary to peptic ulcers. Has been taking Aleve. H/O Variceal Bleed - s/p OSH banding June 2016. 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 RN reporting onmy some OG returns  and clinically both improved - . Ascites leak post paracentesis from 02/12/15 continues  P:    start tube  lactulose 02/14/2015 and xifaxan through NG iif holding well 02/15/15 Lactulose enema tid Start trickle tube feeds Protonix IV bid Monitor ascites leak   HEMATOLOGIC    A:   Anemia - Secondary to GIB. Stable. No signs of active bleeding. Coagulopathy - Secondary to cirrhosis Thrombocytopenia - Mild & stable  - unchanged  P:  Transfuse for Hgb <7.0 or active bleeding SCDs for DVT prophylaxis No chemical DVT prophylaxis given GIB  INFECTIOUS A:   SBP - Bacillus species & Coag Neg Staph Possible Aortic Valve Vegitation  P:   Trending leukocyte count daily Monitoring for Fever TTE for possible AV vegitation Repeat cultures for any fever  BCx2 8/6>> Ascites 8/8>>>Bacillus species & Coag Neg Staph  Abx:  Rocephin, start date 8/6, day 6/x Vancomycin, 8/8>>8/11  ENDOCRINE A:   No acute issues.  P:   Accuchecks q4hr MD to be notified for BG <90 or >160 D5LR @ 50cc/hr  NEUROLOGIC A:   Acute encephalopathy - Hepatic vs Medication Induced. Fentanyl gtt overnight.   - off fent gtt x48h. RASS -4 to -2 and better. CT head negative  P:   Aim to get lactulose via enteral route 02/14/2015 RASS goal: 0 to -1 PRN Fentanyl PRN versed Lactulose enema tid Continue Thiamine IV daily   FAMILY  - Updates: Wife & other family tnot present at bedside 02/13/2015. Mom updated 02/14/2015   - Inter-disciplinary family meet or Palliative Care meeting due by:  8/13   TODAY'S SUMMARY:   Having ascites leak.  Slowly improved mental status and ileus. Aim for trickle feed and per tube lactulose 02/14/2015 . Ongoinf neo need     The patient is critically ill with multiple organ systems failure and requires high complexity decision making for assessment and support, frequent evaluation and titration of therapies, application of advanced monitoring technologies and extensive  interpretation of multiple databases.   Critical Care Time devoted to patient care services described in this note is  30  Minutes. This time reflects time of care of this signee Dr Kalman Shan. This critical care time does  not reflect procedure time, or teaching time or supervisory time of PA/NP/Med student/Med Resident etc but could involve care discussion time    Dr. Kalman Shan, M.D., [facility].C.P Pulmonary and Critical Care Medicine Staff Physician [facility] Pager: [phone], If no answer or between  15:00h - 7:00h: call [phone]  02/14/2015 12:59 PM

## 2015-02-15 ENCOUNTER — Inpatient Hospital Stay (HOSPITAL_COMMUNITY): Payer: Medicaid - Out of State

## 2015-02-15 LAB — COMPREHENSIVE METABOLIC PANEL
ALK PHOS: 57 U/L (ref 38–126)
ALT: 14 U/L — AB (ref 17–63)
ANION GAP: 4 — AB (ref 5–15)
AST: 45 U/L — ABNORMAL HIGH (ref 15–41)
Albumin: 1.7 g/dL — ABNORMAL LOW (ref 3.5–5.0)
BUN: 7 mg/dL (ref 6–20)
CALCIUM: 7.5 mg/dL — AB (ref 8.9–10.3)
CO2: 22 mmol/L (ref 22–32)
CREATININE: 0.88 mg/dL (ref 0.61–1.24)
Chloride: 115 mmol/L — ABNORMAL HIGH (ref 101–111)
Glucose, Bld: 101 mg/dL — ABNORMAL HIGH (ref 65–99)
Potassium: 3.1 mmol/L — ABNORMAL LOW (ref 3.5–5.1)
SODIUM: 141 mmol/L (ref 135–145)
TOTAL PROTEIN: 5.6 g/dL — AB (ref 6.5–8.1)
Total Bilirubin: 2.2 mg/dL — ABNORMAL HIGH (ref 0.3–1.2)

## 2015-02-15 LAB — FIBRINOGEN: FIBRINOGEN: 71 mg/dL — AB (ref 204–475)

## 2015-02-15 LAB — PHOSPHORUS: PHOSPHORUS: 2.5 mg/dL (ref 2.5–4.6)

## 2015-02-15 LAB — GLUCOSE, CAPILLARY
GLUCOSE-CAPILLARY: 100 mg/dL — AB (ref 65–99)
GLUCOSE-CAPILLARY: 97 mg/dL (ref 65–99)
Glucose-Capillary: 90 mg/dL (ref 65–99)
Glucose-Capillary: 93 mg/dL (ref 65–99)

## 2015-02-15 LAB — MAGNESIUM: MAGNESIUM: 1.7 mg/dL (ref 1.7–2.4)

## 2015-02-15 LAB — PROTIME-INR
INR: 2.44 — ABNORMAL HIGH (ref 0.00–1.49)
Prothrombin Time: 26.2 seconds — ABNORMAL HIGH (ref 11.6–15.2)

## 2015-02-15 LAB — APTT: aPTT: 54 seconds — ABNORMAL HIGH (ref 24–37)

## 2015-02-15 MED ORDER — FENTANYL CITRATE (PF) 100 MCG/2ML IJ SOLN
25.0000 ug | INTRAMUSCULAR | Status: DC | PRN
  Administered 2015-02-15 – 2015-02-16 (×5): 50 ug via INTRAVENOUS
  Filled 2015-02-15 (×5): qty 2

## 2015-02-15 MED ORDER — POTASSIUM CHLORIDE 10 MEQ/50ML IV SOLN
10.0000 meq | INTRAVENOUS | Status: AC
  Administered 2015-02-15 (×2): 10 meq via INTRAVENOUS
  Filled 2015-02-15 (×2): qty 50

## 2015-02-15 MED ORDER — MAGNESIUM SULFATE 2 GM/50ML IV SOLN
2.0000 g | Freq: Once | INTRAVENOUS | Status: AC
  Administered 2015-02-15: 2 g via INTRAVENOUS
  Filled 2015-02-15: qty 50

## 2015-02-15 NOTE — Progress Notes (Signed)
PULMONARY / CRITICAL CARE MEDICINE   Name: Francisco [name] MRN: [id number] DOB: 06/25/1966    ADMISSION DATE:  02/06/2015 CONSULTATION DATE:  02/06/15  REFERRING MD :  ED  CHIEF COMPLAINT:  Variceal Bleed & CHF  INITIAL PRESENTATION: Patient admitted with worsening dyspnea and lower extremity edema. Recently discharged from SNF after long hospitalization at OSH in [place] with GIB from esophageal varices post banding.  STUDIES:  Portable CXR 8/6 - Peribronchial cuffing. No consolidation or effusion. TTE 8/7 - EF 55-60%. No wall motion abnormality. Grade 1 diastolic dysfunction. Hazy density on AV.  SIGNIFICANT EVENTS: 8/06  Admit to ICU 8/06  Transfuse 1u PRBCs 8/06  Elective intubation & Left IJ placed 8/06  EGD w/o varices but w/ small peptic ulcers 8/07  paracentesis 3.8 liters.  8/11  slowly increasing vasopressor requirement 8/12  Pink tinged output during lactulose enema overnight. On Fentanyl gtt for agitation, now patient comatose & nonresponsive now. Received 1 dose of Ativan in last 24 hours. Continues to require vasopressor support. 8/12  Paracentesis  8/13  Unresponsive most of the time and mostly breathes with vent per RN. S/p Paracentesis 2L yesterday and now with significant leak at puncture site - requiring ostomy bag to collect drainage. Not actively bleeing. Not on sedation. On neo with MAP> 65 and sbp 86. Ongoing adynamc ileus +  8/14  More responsive, 1L ascites leak overnight through bag, remains on neo  SUBJECTIVE:  Remains on 80 mcg of neo, para site continues to leak approx 1L of fluid per shift, no sedation, pt calm.  Question of flexi-seal being over inflated   VITAL SIGNS: Temp:  [97.6 F (36.4 C)-99.7 F (37.6 C)] 99.1 F (37.3 C) (08/15 0600) Pulse Rate:  [103-113] 113 (08/15 0312) Resp:  [14-23] 15 (08/15 0600) BP: (83-112)/(47-69) 95/53 mmHg (08/15 0600) SpO2:  [100 %] 100 % (08/15 0600) FiO2 (%):  [30 %] 30 % (08/15 0845) Weight:  [238 lb 1.6  oz (108 kg)] 238 lb 1.6 oz (108 kg) (08/15 0400)   HEMODYNAMICS:     VENTILATOR SETTINGS: Vent Mode:  [-] PRVC FiO2 (%):  [30 %] 30 % Set Rate:  [15 bmp] 15 bmp Vt Set:  [580 mL] 580 mL PEEP:  [5 cmH20] 5 cmH20 Plateau Pressure:  [13 cmH20-19 cmH20] 16 cmH20   INTAKE / OUTPUT:  Intake/Output Summary (Last 24 hours) at 02/15/15 1106 Last data filed at 02/15/15 0600  Gross per 24 hour  Intake 1746.73 ml  Output   2720 ml  Net -973.27 ml    PHYSICAL EXAMINATION: General: chronically ill appearing adult male in NAD on vent Neuro:  Opens eyes to voice, spontaneously moves extremities, generalized weakness HEENT:  Tacky MM. ETT in place. No scleral injection.  Cardiovascular:  s1s2 rrr, no m/r/g, palpable pulses Lungs:  Even/non-labored on vent, lungs bilaterally clear anterior, diminished lateral/bases Abdomen:  Protuberant but soft. Bowel sounds present with increasing activity. Ostomy bag RLQ - draining ascites + Musculoskeletal:  No joint deformity or effusion.  Skin:  Warm & dry. No rash. No cyanosis.  LABS:   PULMONARY No results for input(s): PHART, PCO2ART, PO2ART, HCO3, TCO2, O2SAT in the last 168 hours.  Invalid input(s): PCO2, PO2  CBC  Recent Labs Lab 02/13/15 0320 02/13/15 1640 02/14/15 0400  HGB 7.7* 7.8* 7.8*  HCT 24.0* 25.0* 24.1*  WBC 11.1* 12.0* 11.0*  PLT 56* 63* 63*   COAGULATION  Recent Labs Lab 02/11/15 0500 02/12/15 0416 02/13/15 0320 02/14/15 0400  02/15/15 0530  INR 2.12* 2.22* 2.31* 2.49* 2.44*   CARDIAC  Recent Labs Lab 02/11/15 1309 02/11/15 1847 02/12/15 0007  TROPONINI <0.03 <0.03 <0.03   No results for input(s): PROBNP in the last 168 hours.  CHEMISTRY  Recent Labs Lab 02/11/15 0500 02/12/15 0416 02/13/15 0320 02/14/15 0400 02/15/15 0530  NA 141 141 142 141 141  K 3.3* 3.6 3.2* 3.0* 3.1*  CL 115* 115* 114* 115* 115*  CO2 18* 21* 22 22 22   GLUCOSE 108* 98 87 95 101*  BUN 16 14 12 9 7   CREATININE 1.24 1.12  1.09 0.95 0.88  CALCIUM 8.1* 8.0* 7.9* 7.6* 7.5*  MG 2.0 1.8 1.6* 1.6* 1.7  PHOS 3.3 3.3 3.2 2.9 2.5   Estimated Creatinine Clearance: 122.2 mL/min (by C-G formula based on Cr of 0.88).  LIVER  Recent Labs Lab 02/11/15 0500 02/12/15 0416 02/13/15 0320 02/14/15 0400 02/15/15 0530  AST 54* 45* 40 39 45*  ALT 15* 15* 14* 15* 14*  ALKPHOS 58 56 57 53 57  BILITOT 2.3* 2.2* 2.4* 2.5* 2.2*  PROT 6.5 6.4* 5.6* 5.5* 5.6*  ALBUMIN 2.1* 1.9* 1.9* 1.7* 1.7*  INR 2.12* 2.22* 2.31* 2.49* 2.44*   INFECTIOUS  Recent Labs Lab 02/14/15 0400  LATICACIDVEN 1.3   ENDOCRINE CBG (last 3)   Recent Labs  02/14/15 2345 02/15/15 0419 02/15/15 0812  GLUCAP 92 90 93    IMAGING x48h  - image(s) personally visualized  -   highlighted in bold Ct Head Wo Contrast  02/13/2015   CLINICAL DATA:  Acute encephalopathy  EXAM: CT HEAD WITHOUT CONTRAST  TECHNIQUE: Contiguous axial images were obtained from the base of the skull through the vertex without intravenous contrast.  COMPARISON:  None.  FINDINGS: The bony calvarium is intact. Mild atrophic changes are noted. No findings to suggest acute hemorrhage, acute infarction or space-occupying mass lesion are noted.  IMPRESSION: Mild atrophy.  No acute abnormality is noted.   Electronically Signed   By: Alcide Clever M.D.   On: 02/13/2015 15:39   Dg Chest Port 1 View  02/14/2015   CLINICAL DATA:  Acute encephalopathy  EXAM: PORTABLE CHEST - 1 VIEW  COMPARISON:  02/11/2015  FINDINGS: Cardiomediastinal silhouette is stable. Again noted NG tube and endotracheal tube unchanged in position. No acute infiltrate or pulmonary edema. Hypoinflation again noted.  IMPRESSION: No active disease. Stable support apparatus. Hypoinflation again noted.   Electronically Signed   By: Natasha Mead M.D.   On: 02/14/2015 09:04   Dg Abd Portable 1v  02/13/2015   CLINICAL DATA:  Abdominal distension.  EXAM: PORTABLE ABDOMEN - 1 VIEW  COMPARISON:  February 12, 2015.  FINDINGS: Stable  dilated small bowel loops are seen in the central and right side of the abdomen most consistent with ileus. No significant colonic dilatation is noted. No abnormal calcifications are noted.  IMPRESSION: Stable dilated small bowel loops are noted most consistent with ileus, but continued radiographic follow-up is recommended to rule out obstruction.   Electronically Signed   By: Lupita Raider, M.D.   On: 02/13/2015 12:07    ASSESSMENT / PLAN:  PULMONARY OETT 8/6 >> A: Acute Respiratory Failure - initially intubated for upper endoscopy electively, altered mental status prohibits extubation. P:   MV support, 8cc/kg Wean PEEP / FiO2 for sats > 90% Daily SBT / WUA  Consider repeat paracentesis prior to extubation (may not need to as original site continues to drain) Minimize sedation Intermittent CXR  CARDIOVASCULAR L IJ CVC 8/6 >> 8/10 PICC 8/10 >> A:  Grade I Diastolic Dysfunction  Questionable AV Vegitation  Hypotension - Secondary to sedation vs Septic Shock Bradycardia - Secondary to Precedex Prolonged QTc P:  TEE for possible vegetation but would likely require transfer to [facility] ICU monitoring  Wean Neo for MAP > 65  RENAL A:   Acute on Chronic CKD - Unknown baseline creatinine. Previously on HD. UOP fluctuates.  Resolved.  Lactic Acidosis - Resolved Hypokalemia  Hypomagnesemia  P:   Monitor UOP / BMP  Neo to maintain MAP>65 Replace electrolytes as indicated   GASTROINTESTINAL A:   GIB - Secondary to peptic ulcers, had been taking Aleve prior to admit. H/O Variceal Bleed - s/p OSH banding June 2016. 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  P:   Lactulose & xifaxan via NGT Lactulose enema tid TF at 10 ml/hr, trickle feeding for now Protonix IV bid Monitor ascites volume leak  HEMATOLOGIC A:   Anemia - secondary to GIB,  table,no signs of active bleeding. Coagulopathy - Secondary to cirrhosis Thrombocytopenia  P:  Transfuse for Hgb <7.0 or active bleeding SCDs for DVT prophylaxis No chemical DVT prophylaxis given GIB  INFECTIOUS A:   SBP - Bacillus species & Coag Neg Staph Possible Aortic Valve Vegetation  P:   Trending leukocyte count daily Monitoring for Fever TEE for possible AV vegetation, arranged for 8/16 Repeat cultures for fever  BCx2 8/6 >> neg Ascites 8/8>>>Bacillus species & Coag Neg Staph Cath tip culture 8/10 >> neg  Rocephin, start date 8/6, day 10/x Vancomycin, 8/8>>8/11  ENDOCRINE A:   No acute issues. P:   Accuchecks q4hr MD to be notified for BG <90 or >160 D5LR @ 50cc/hr  NEUROLOGIC A:   Acute encephalopathy - Hepatic vs medication induced, previously on fentanyl gtt.  Off sedation > 48 hours on 8/15.  CT head negative P:   Continue lactulose / xifaxin  RASS goal: 0  PRN Fentanyl 25-50 mcg for pain  D/C benzo's Lactulose enema tid Continue Thiamine IV daily   FAMILY  - Updates: No family available on am rounds 8/15  - Inter-disciplinary family meet or Palliative Care meeting due by:  8/13   Canary Brim, NP-C [place] Pgr: [phone] or if no answer [phone] 02/15/2015, 11:07 AM

## 2015-02-15 NOTE — Progress Notes (Signed)
Unable to instill lactulose enema. Balloon safety check performed and adequate amount of saline returned to balloon.

## 2015-02-15 NOTE — Progress Notes (Signed)
Date:  February 15, 2015 U.R. performed for needs and level of care. Will continue to follow for Case Management needs.  Tomisha Reppucci, RN, BSN, CCM   [phone] 

## 2015-02-15 NOTE — Progress Notes (Signed)
RN called RT to check positon of ETT. ETT should be 23cm at the lip, and found at 19cm at lip. No vocal noises noted, good return on vt. RT deflated balloon and pushed ET tube down 4cm to 23cm @ lip. ETT cuff reinflated, BBS's good and equal with good Vt return. Canary Brim, NP notified and CXR obtained to check tube placement. RT will continue to monitor.

## 2015-02-15 NOTE — Progress Notes (Signed)
CRITICAL VALUE ALERT  Critical value received:  Fibrinogen 71  Date of notification:  02/15/15  Time of notification:  0705  Critical value read back:Yes.    Nurse who received alert:  Max Sane, RN  MD notified (1st page):  PCCM

## 2015-02-15 NOTE — Progress Notes (Signed)
Nutrition Follow-up  DOCUMENTATION CODES:   Obesity unspecified  INTERVENTION:   Continue trickle feeds of Vital AF 1.2 @ 10 ml/hr via OGT. Provides 288 kcal and 18g of protein. RD to follow-up 8/16 for plan and advancement.  TF Goal: Vital AF 1.2 at 30 ml/hr. 60 ml Prostat TID.   Tube feeding regimen provides 1464 kcal (70% of needs), 144 grams of protein, and 584 ml of H2O.   NUTRITION DIAGNOSIS:   Inadequate oral intake related to inability to eat as evidenced by NPO status.  Ongoing.  GOAL:   Provide needs based on ASPEN/SCCM guidelines  Not meeting with trickle feeds  MONITOR:   Vent status, Labs, Weight trends, TF tolerance, Skin, I & O's  REASON FOR ASSESSMENT:   Consult Enteral/tube feeding initiation and management (Trickle feeds)  ASSESSMENT:   Patient admitted with worsening dyspnea and lower extremity edema. Recently discharged from SNF after long hospitalization at OSH in [place] with GIB from esophageal varices post banding.  Pt tolerating trickle feeds at this time. No plans to advance today. Per critical care note, weaning trial is planned for later today and with that PO feeds.  Patient is currently intubated on ventilator support MV: 17.2 L/min Temp (24hrs), Avg:99.1 F (37.3 C), Min:98.6 F (37 C), Max:99.7 F (37.6 C)  Propofol: none  Labs reviewed: Low K Mg/Phos WNL  Diet Order:  Diet NPO time specified  Skin:  Reviewed, no issues  Last BM:  8/12  Height:   Ht Readings from Last 1 Encounters:  02/13/15  (1.778 m)    Weight:   Wt Readings from Last 1 Encounters:  02/15/15 238 lb 1.6 oz (108 kg)    Ideal Body Weight:  75.5 kg  BMI:  Body mass index is 34.16 kg/(m^2).  Estimated Nutritional Needs:   Kcal:  1610-9604  Protein:  150-160g  Fluid:  2.1L/day  EDUCATION NEEDS:   No education needs identified at this time  Tilda Franco, MS, RD, LDN Pager: [phone] After Hours Pager: [phone]

## 2015-02-16 ENCOUNTER — Inpatient Hospital Stay (HOSPITAL_COMMUNITY): Payer: Medicaid - Out of State

## 2015-02-16 LAB — COMPREHENSIVE METABOLIC PANEL
ALBUMIN: 1.8 g/dL — AB (ref 3.5–5.0)
ALT: 17 U/L (ref 17–63)
AST: 57 U/L — AB (ref 15–41)
Alkaline Phosphatase: 62 U/L (ref 38–126)
Anion gap: 6 (ref 5–15)
BUN: 6 mg/dL (ref 6–20)
CHLORIDE: 115 mmol/L — AB (ref 101–111)
CO2: 21 mmol/L — ABNORMAL LOW (ref 22–32)
Calcium: 7.5 mg/dL — ABNORMAL LOW (ref 8.9–10.3)
Creatinine, Ser: 0.92 mg/dL (ref 0.61–1.24)
GFR calc Af Amer: >60 mL/min (ref >60)
GFR calc non Af Amer: >60 mL/min (ref >60)
GLUCOSE: 103 mg/dL — AB (ref 65–99)
POTASSIUM: 3 mmol/L — AB (ref 3.5–5.1)
Sodium: 142 mmol/L (ref 135–145)
Total Bilirubin: 2.4 mg/dL — ABNORMAL HIGH (ref 0.3–1.2)
Total Protein: 6 g/dL — ABNORMAL LOW (ref 6.5–8.1)

## 2015-02-16 LAB — GLUCOSE, CAPILLARY
GLUCOSE-CAPILLARY: 100 mg/dL — AB (ref 65–99)
GLUCOSE-CAPILLARY: 101 mg/dL — AB (ref 65–99)
GLUCOSE-CAPILLARY: 109 mg/dL — AB (ref 65–99)
Glucose-Capillary: 94 mg/dL (ref 65–99)
Glucose-Capillary: 124 mg/dL — ABNORMAL HIGH (ref 65–99)
Glucose-Capillary: 91 mg/dL (ref 65–99)

## 2015-02-16 LAB — CBC
HCT: 24.4 % — ABNORMAL LOW (ref 39.0–52.0)
Hemoglobin: 8 g/dL — ABNORMAL LOW (ref 13.0–17.0)
MCH: 28.7 pg (ref 26.0–34.0)
MCHC: 32.8 g/dL (ref 30.0–36.0)
MCV: 87.5 fL (ref 78.0–100.0)
PLATELETS: 66 10*3/uL — AB (ref 150–400)
RBC: 2.79 MIL/uL — AB (ref 4.22–5.81)
RDW: 17 % — AB (ref 11.5–15.5)
WBC: 13.1 10*3/uL — AB (ref 4.0–10.5)

## 2015-02-16 LAB — PROTIME-INR
INR: 2.8 — AB (ref 0.00–1.49)
PROTHROMBIN TIME: 29.1 s — AB (ref 11.6–15.2)

## 2015-02-16 LAB — MAGNESIUM: MAGNESIUM: 1.7 mg/dL (ref 1.7–2.4)

## 2015-02-16 LAB — FIBRINOGEN: FIBRINOGEN: 60 mg/dL — AB (ref 204–475)

## 2015-02-16 LAB — PHOSPHORUS: Phosphorus: 2.1 mg/dL — ABNORMAL LOW (ref 2.5–4.6)

## 2015-02-16 LAB — APTT: aPTT: 55 seconds — ABNORMAL HIGH (ref 24–37)

## 2015-02-16 MED ORDER — ACETAMINOPHEN 160 MG/5ML PO SOLN
650.0000 mg | Freq: Four times a day (QID) | ORAL | Status: DC | PRN
  Administered 2015-02-16: 650 mg
  Filled 2015-02-16: qty 20.3

## 2015-02-16 MED ORDER — POTASSIUM PHOSPHATES 15 MMOLE/5ML IV SOLN
30.0000 mmol | Freq: Once | INTRAVENOUS | Status: AC
  Administered 2015-02-16: 30 mmol via INTRAVENOUS
  Filled 2015-02-16: qty 10

## 2015-02-16 MED ORDER — SODIUM CHLORIDE 0.9 % IV BOLUS (SEPSIS)
1000.0000 mL | Freq: Once | INTRAVENOUS | Status: AC
  Administered 2015-02-16: 1000 mL via INTRAVENOUS

## 2015-02-16 MED ORDER — MAGNESIUM SULFATE 2 GM/50ML IV SOLN
2.0000 g | Freq: Once | INTRAVENOUS | Status: AC
  Administered 2015-02-16: 2 g via INTRAVENOUS
  Filled 2015-02-16: qty 50

## 2015-02-16 MED ORDER — HALOPERIDOL LACTATE 5 MG/ML IJ SOLN
2.0000 mg | Freq: Four times a day (QID) | INTRAMUSCULAR | Status: DC | PRN
  Administered 2015-02-17 – 2015-02-19 (×3): 2 mg via INTRAVENOUS
  Filled 2015-02-16 (×3): qty 1

## 2015-02-16 NOTE — Plan of Care (Signed)
Problem: Phase II Progression Outcomes Goal: Date pt extubated/weaned off vent Outcome: Completed/Met Date Met:  02/16/15 Extubated on 8/16 at 1000

## 2015-02-16 NOTE — Progress Notes (Signed)
Hold tube feeding for now since possible extubation per Canary Brim.  Erick Blinks, RN

## 2015-02-16 NOTE — Progress Notes (Signed)
RN called RT to let RT know that patients tube was at 22 @ the lip instead at 23@ the lip. RT advanced tube back to 23@ the lip. RT will continue to monitor.

## 2015-02-16 NOTE — Progress Notes (Signed)
Pt hr sustaining 140s.  Informed Elink.  Erick Blinks, RN

## 2015-02-16 NOTE — Progress Notes (Signed)
Pt not able to hold lactulose enema at all.  Informed Canary Brim who stated to d/c lactulose enema as well as start on clear liquid diet and advance as tolerated.  Erick Blinks, RN

## 2015-02-16 NOTE — Progress Notes (Signed)
eLink Physician-Brief Progress Note Patient Name: Francisco [name] DOB: [birth year] MRN: [id number]   Date of Service  02/16/2015  HPI/Events of Note  Temp of 101 on ABX. Hypokalemia, hypophosphatemia, hypomag  eICU Interventions  PRN tylenol order Potassium, phos, and mag replaced     Intervention Category Intermediate Interventions: Electrolyte abnormality - evaluation and management Minor Interventions: Routine modifications to care plan (e.g. PRN medications for pain, fever)  Kerrilyn Azbill 02/16/2015, 5:27 AM

## 2015-02-16 NOTE — Procedures (Signed)
Extubation Procedure Note  Patient Details:   Name: Francisco [name] DOB: Aug 08, 1965 MRN: [id number]   Airway Documentation:     Evaluation  O2 sats: stable throughout Complications: No apparent complications Patient did tolerate procedure well. Bilateral Breath Sounds: Diminished Suctioning: Airway Yes  Veryl Speak 02/16/2015, 10:29 AM

## 2015-02-16 NOTE — Progress Notes (Signed)
CRITICAL VALUE ALERT  Critical value received:  Fibrinogen 60  Date of notification:  02/16/2915  Time of notification:  0615  Critical value read back:Yes.    Nurse who received alert:  Drue Stager, RN  MD notified (1st page):  Deterding  Time of first page:  0615  MD notified (2nd page):  Time of second page:  Responding MD:  Deterding  Time MD responded:  [phone]

## 2015-02-16 NOTE — Progress Notes (Signed)
PULMONARY / CRITICAL CARE MEDICINE   Name: Francisco [name] MRN: [id number] DOB: [birth year]    ADMISSION DATE:  02/06/2015 CONSULTATION DATE:  02/06/15  REFERRING MD :  ED  CHIEF COMPLAINT:  Variceal Bleed & CHF  INITIAL PRESENTATION: Patient admitted with worsening dyspnea and lower extremity edema. Recently discharged from SNF after long hospitalization at OSH in [place] with GIB from esophageal varices post banding.  STUDIES:  Portable CXR 8/6 - Peribronchial cuffing. No consolidation or effusion. TTE 8/7 - EF 55-60%. No wall motion abnormality. Grade 1 diastolic dysfunction. Hazy density on AV.  SIGNIFICANT EVENTS: 8/06  Admit to ICU 8/06  Transfuse 1u PRBCs 8/06  Elective intubation & Left IJ placed 8/06  EGD w/o varices but w/ small peptic ulcers 8/07  paracentesis 3.8 liters.  8/11  slowly increasing vasopressor requirement 8/12  Pink tinged output during lactulose enema overnight. On Fentanyl gtt for agitation, now patient comatose & nonresponsive now. Received 1 dose of Ativan in last 24 hours. Continues to require vasopressor support. 8/12  Paracentesis  8/13  Unresponsive most of the time and mostly breathes with vent per RN. S/p Paracentesis 2L yesterday and now with significant leak at puncture site - requiring ostomy bag to collect drainage. Not actively bleeing. Not on sedation. On neo with MAP> 65 and sbp 86. Ongoing adynamc ileus +  8/14  More responsive, 1L ascites leak overnight through bag, remains on neo  SUBJECTIVE:  RN reports neo weaned off.  Pt on PSV 5/5, 30% and tolerating well.  Mouthing for ETT to be removed. RT reports ETT has moved in/out since yesterday due to holder.  750 ml out of para site in last 12 hours   VITAL SIGNS: Temp:  [97.7 F (36.5 C)-101.8 F (38.8 C)] 99.7 F (37.6 C) (08/16 0930) Pulse Rate:  [113-118] 118 (08/16 0930) Resp:  [15-29] 23 (08/16 0930) BP: (73-112)/(51-86) 97/74 mmHg (08/16 0930) SpO2:  [100 %] 100 % (08/16  0930) FiO2 (%):  [30 %] 30 % (08/16 0930) Weight:  [234 lb 5.6 oz (106.3 kg)] 234 lb 5.6 oz (106.3 kg) (08/16 0412)   HEMODYNAMICS:     VENTILATOR SETTINGS: Vent Mode:  [-] CPAP;PSV FiO2 (%):  [30 %] 30 % Set Rate:  [15 bmp] 15 bmp Vt Set:  [580 mL] 580 mL PEEP:  [5 cmH20] 5 cmH20 Pressure Support:  [5 cmH20] 5 cmH20 Plateau Pressure:  [15 cmH20-26 cmH20] 26 cmH20   INTAKE / OUTPUT:  Intake/Output Summary (Last 24 hours) at 02/16/15 1025 Last data filed at 02/16/15 0958  Gross per 24 hour  Intake 2701.42 ml  Output   3149 ml  Net -447.58 ml    PHYSICAL EXAMINATION: General: chronically ill appearing adult male in NAD  Neuro:  More alert, mouthing words, spontaneously moves extremities, generalized weakness, follows commands HEENT:  Tacky MM. ETT in place. No scleral injection.  Cardiovascular:  s1s2 rrr, no m/r/g, palpable pulses Lungs:  Even/non-labored on vent, lungs bilaterally clear anterior, diminished lateral/bases Abdomen:  Protuberant but soft. Bowel sounds present with increasing activity. Ostomy bag RLQ - draining ascites + Musculoskeletal:  No joint deformity or effusion.  Skin:  Warm & dry. No rash. No cyanosis.  LABS:   PULMONARY No results for input(s): PHART, PCO2ART, PO2ART, HCO3, TCO2, O2SAT in the last 168 hours.  Invalid input(s): PCO2, PO2  CBC  Recent Labs Lab 02/13/15 1640 02/14/15 0400 02/16/15 0425  HGB 7.8* 7.8* 8.0*  HCT 25.0* 24.1* 24.4*  WBC  12.0* 11.0* 13.1*  PLT 63* 63* 66*   COAGULATION  Recent Labs Lab 02/12/15 0416 02/13/15 0320 02/14/15 0400 02/15/15 0530 02/16/15 0425  INR 2.22* 2.31* 2.49* 2.44* 2.80*   CARDIAC  Recent Labs Lab 02/11/15 1309 02/11/15 1847 02/12/15 0007  TROPONINI <0.03 <0.03 <0.03   No results for input(s): PROBNP in the last 168 hours.  CHEMISTRY  Recent Labs Lab 02/12/15 0416 02/13/15 0320 02/14/15 0400 02/15/15 0530 02/16/15 0425  NA 141 142 141 141 142  K 3.6 3.2* 3.0* 3.1*  3.0*  CL 115* 114* 115* 115* 115*  CO2 21* 22 22 22  21*  GLUCOSE 98 87 95 101* 103*  BUN 14 12 9 7 6   CREATININE 1.12 1.09 0.95 0.88 0.92  CALCIUM 8.0* 7.9* 7.6* 7.5* 7.5*  MG 1.8 1.6* 1.6* 1.7 1.7  PHOS 3.3 3.2 2.9 2.5 2.1*   Estimated Creatinine Clearance: 116 mL/min (by C-G formula based on Cr of 0.92).  LIVER  Recent Labs Lab 02/12/15 0416 02/13/15 0320 02/14/15 0400 02/15/15 0530 02/16/15 0425  AST 45* 40 39 45* 57*  ALT 15* 14* 15* 14* 17  ALKPHOS 56 57 53 57 62  BILITOT 2.2* 2.4* 2.5* 2.2* 2.4*  PROT 6.4* 5.6* 5.5* 5.6* 6.0*  ALBUMIN 1.9* 1.9* 1.7* 1.7* 1.8*  INR 2.22* 2.31* 2.49* 2.44* 2.80*   INFECTIOUS  Recent Labs Lab 02/14/15 0400  LATICACIDVEN 1.3   ENDOCRINE CBG (last 3)   Recent Labs  02/15/15 2354 02/16/15 0344 02/16/15 0744  GLUCAP 100* 101* 124*    IMAGING x48h  - image(s) personally visualized  -   highlighted in bold Dg Chest Port 1 View  02/16/2015   CLINICAL DATA:  Hypoxia  EXAM: PORTABLE CHEST - 1 VIEW  COMPARISON:  February 15, 2015  FINDINGS: Endotracheal tube tip is 3.2 cm above the carina. Central catheter tip is in the right atrium. Nasogastric tube tip and side port are below the diaphragm. No pneumothorax.  There is mild interstitial edema. There is no airspace consolidation. Heart is normal in size and contour. Pulmonary vascularity are normal. No adenopathy. There is a probable bone island in the proximal right humerus, stable. There is arthropathy in both shoulders.  IMPRESSION: Tube and catheter positions as described without pneumothorax. Mild generalized interstitial edema without airspace consolidation. No change in cardiac silhouette.   Electronically Signed   By: Bretta Bang III M.D.   On: 02/16/2015 07:10   Dg Chest Port 1 View  02/15/2015   CLINICAL DATA:  Cirrhosis, varices and gastrointestinal bleeding. Intubated patient.  EXAM: PORTABLE CHEST - 1 VIEW  COMPARISON:  Single view of the chest 02/14/2015 and 02/11/2015.   FINDINGS: Support tubes and lines are unchanged. Lung volumes are low but the lungs are clear. Heart size is normal. No pneumothorax or pleural effusion.  IMPRESSION: No change in support apparatus.  No acute disease.   Electronically Signed   By: Drusilla Kanner M.D.   On: 02/15/2015 15:15    ASSESSMENT / PLAN:  PULMONARY OETT 8/6 >> A: Acute Respiratory Failure - initially intubated for upper endoscopy electively, altered mental status prohibits extubation. P:   Meets criteria for extubation Oxygen for saturations > 92% Intermittent CXR  Pulmonary hygiene: IS, mobilize  CARDIOVASCULAR L IJ CVC 8/6 >> 8/10 PICC 8/10 >> A:  Grade I Diastolic Dysfunction  Questionable AV Vegitation  Hypotension - Secondary to sedation vs Septic Shock Bradycardia - Secondary to Precedex Prolonged QTc P:  TEE for possible vegetation  but would likely require transfer to [facility] ICU monitoring  Wean Neo to off for MAP > 65  RENAL A:   Acute on Chronic CKD - Unknown baseline creatinine. Previously on HD. UOP fluctuates.    Lactic Acidosis - Resolved Hypokalemia  Hypomagnesemia  P:   Monitor UOP / BMP  Replace electrolytes as indicated   GASTROINTESTINAL A:   GIB - Secondary to peptic ulcers, had been taking Aleve prior to admit. H/O Variceal Bleed - s/p OSH banding June 2016. 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  P:   Lactulose & xifaxan via NGT Lactulose enema tid Protonix IV bid Monitor ascites volume leak  HEMATOLOGIC A:   Anemia - secondary to GIB, table,no signs of active bleeding. Coagulopathy - Secondary to cirrhosis Thrombocytopenia  P:  Transfuse for Hgb <7.0 or active bleeding SCDs for DVT prophylaxis No chemical DVT prophylaxis given GIB  INFECTIOUS A:   SBP - Bacillus species & Coag Neg Staph Possible Aortic Valve Vegetation   P:   Monitor fever curve / WBC TEE for possible AV vegetation, will discuss with Cardiology Repeat BC 8/16, will decide regarding TEE pending review  BCx2 8/6 >> neg Ascites 8/8>>>Bacillus species & Coag Neg Staph Cath tip culture 8/10 >> neg BCx2 8/16 >>   Rocephin, start date 8/6, day 10/x Vancomycin, 8/8 >> 8/11  ENDOCRINE A:   No acute issues. P:   Accuchecks q4hr MD to be notified for BG <90 or >160 D5LR @ 50cc/hr  NEUROLOGIC A:   Acute encephalopathy - Hepatic vs medication induced, previously on fentanyl gtt.  Off sedation > 48 hours on 8/15.  CT head negative P:   Continue lactulose / xifaxin  RASS goal: 0  Lactulose enema tid Continue Thiamine IV daily   FAMILY  - Updates: No family available on am rounds 8/16    Canary Brim, NP-C [facility] Pgr: [phone] or if no answer [phone] 02/16/2015, 10:25 AM

## 2015-02-16 NOTE — Progress Notes (Signed)
eLink Physician-Brief Progress Note Patient Name: Rocklin Soderquist DOB: [birth year] MRN: [id number]   Date of Service  02/16/2015  HPI/Events of Note  Multiple issues: 1. Sinus Tachycardia. HR = 138. 2. Agitation/Delirium - looks comfortable now.  eICU Interventions  Will order: 1. Bolus with 0.9 NaCl IV over 1 hour now. 2. Haldol 2 mg IV Q 6 hours PRN agitation. 3. Monitor QTc interval Q 6 hours. Notify MD if QTc interval > 500 milliseconds.      Intervention Category Major Interventions: Delirium, psychosis, severe agitation - evaluation and management Intermediate Interventions: Arrhythmia - evaluation and management  Sommer,Steven Eugene 02/16/2015, 5:29 PM

## 2015-02-16 NOTE — Plan of Care (Signed)
Pt tolerated clear liquid with no signs of aspiration.  Will advance to full liquids as ordered.  Erick Blinks, RN

## 2015-02-17 ENCOUNTER — Inpatient Hospital Stay (HOSPITAL_COMMUNITY): Payer: Medicaid - Out of State

## 2015-02-17 LAB — COMPREHENSIVE METABOLIC PANEL
ALK PHOS: 56 U/L (ref 38–126)
ALT: 18 U/L (ref 17–63)
AST: 61 U/L — ABNORMAL HIGH (ref 15–41)
Albumin: 1.8 g/dL — ABNORMAL LOW (ref 3.5–5.0)
Anion gap: 5 (ref 5–15)
BILIRUBIN TOTAL: 2.1 mg/dL — AB (ref 0.3–1.2)
BUN: 6 mg/dL (ref 6–20)
CALCIUM: 7.5 mg/dL — AB (ref 8.9–10.3)
CO2: 21 mmol/L — AB (ref 22–32)
CREATININE: 1 mg/dL (ref 0.61–1.24)
Chloride: 116 mmol/L — ABNORMAL HIGH (ref 101–111)
GFR calc non Af Amer: >60 mL/min (ref >60)
GLUCOSE: 90 mg/dL (ref 65–99)
Potassium: 3.1 mmol/L — ABNORMAL LOW (ref 3.5–5.1)
SODIUM: 142 mmol/L (ref 135–145)
TOTAL PROTEIN: 5.9 g/dL — AB (ref 6.5–8.1)

## 2015-02-17 LAB — GLUCOSE, CAPILLARY
GLUCOSE-CAPILLARY: 90 mg/dL (ref 65–99)
GLUCOSE-CAPILLARY: 103 mg/dL — AB (ref 65–99)
GLUCOSE-CAPILLARY: 94 mg/dL (ref 65–99)
Glucose-Capillary: 86 mg/dL (ref 65–99)
Glucose-Capillary: 91 mg/dL (ref 65–99)
Glucose-Capillary: 84 mg/dL (ref 65–99)
Glucose-Capillary: 110 mg/dL — ABNORMAL HIGH (ref 65–99)

## 2015-02-17 LAB — APTT: APTT: 59 s — AB (ref 24–37)

## 2015-02-17 LAB — CBC
HEMATOCRIT: 21.8 % — AB (ref 39.0–52.0)
HEMOGLOBIN: 7.1 g/dL — AB (ref 13.0–17.0)
MCH: 28.3 pg (ref 26.0–34.0)
MCHC: 32.6 g/dL (ref 30.0–36.0)
MCV: 86.9 fL (ref 78.0–100.0)
Platelets: 52 10*3/uL — ABNORMAL LOW (ref 150–400)
RBC: 2.51 MIL/uL — ABNORMAL LOW (ref 4.22–5.81)
RDW: 16.7 % — ABNORMAL HIGH (ref 11.5–15.5)
WBC: 16.3 10*3/uL — AB (ref 4.0–10.5)

## 2015-02-17 LAB — PHOSPHORUS: PHOSPHORUS: 2.7 mg/dL (ref 2.5–4.6)

## 2015-02-17 LAB — PROTIME-INR
INR: 3.39 — ABNORMAL HIGH (ref 0.00–1.49)
Prothrombin Time: 33.6 seconds — ABNORMAL HIGH (ref 11.6–15.2)

## 2015-02-17 LAB — FIBRINOGEN: Fibrinogen: <60 mg/dL — CL (ref 204–475)

## 2015-02-17 LAB — MAGNESIUM: Magnesium: 1.8 mg/dL (ref 1.7–2.4)

## 2015-02-17 MED ORDER — ENSURE ENLIVE PO LIQD
237.0000 mL | Freq: Two times a day (BID) | ORAL | Status: DC
  Administered 2015-02-19 – 2015-02-25 (×8): 237 mL via ORAL

## 2015-02-17 MED ORDER — POTASSIUM CHLORIDE CRYS ER 20 MEQ PO TBCR
30.0000 meq | EXTENDED_RELEASE_TABLET | ORAL | Status: AC
  Administered 2015-02-17 (×2): 30 meq via ORAL
  Filled 2015-02-17 (×4): qty 1

## 2015-02-17 MED ORDER — VITAMIN B-1 100 MG PO TABS
100.0000 mg | ORAL_TABLET | Freq: Every day | ORAL | Status: DC
  Administered 2015-02-17 – 2015-02-26 (×10): 100 mg via ORAL
  Filled 2015-02-17 (×10): qty 1

## 2015-02-17 MED ORDER — MAGNESIUM SULFATE 2 GM/50ML IV SOLN
2.0000 g | Freq: Once | INTRAVENOUS | Status: AC
  Administered 2015-02-17: 2 g via INTRAVENOUS
  Filled 2015-02-17: qty 50

## 2015-02-17 NOTE — Progress Notes (Signed)
CRITICAL VALUE ALERT  Critical value received:  Fibrinogen < 60  Date of notification:  02-17-15  Time of notification:  06:50  Critical value read back:Yes.    Nurse who received alert:  Shritha Bresee D. Taneasha Fuqua  MD notified (1st page):  D. McQuaid  Time of first page:  06:52  MD notified (2nd page): N/A  Time of second page: N/A  Responding MD:  Dr. Kendrick Fries  Time MD responded:  Dr. Kendrick Fries

## 2015-02-17 NOTE — Progress Notes (Signed)
Nutrition Follow-up  DOCUMENTATION CODES:   Obesity unspecified  INTERVENTION:  - Continue to advance diet as medically feasible - Will order Ensure Enlive BID, each supplement provides 350 kcal and 20 grams of protein - RD will continue to monitor for needs  NUTRITION DIAGNOSIS:   Inadequate oral intake related to inability to eat as evidenced by NPO status. -resolving with diet advancement beginning 8/16  GOAL:   Patient will meet greater than or equal to 90% of their needs -unmet  MONITOR:   Diet advancement, PO intake, Supplement acceptance, Weight trends, Labs, I & O's  REASON FOR ASSESSMENT:   Consult Enteral/tube feeding initiation and management (Trickle feeds)  ASSESSMENT:   Patient admitted with worsening dyspnea and lower extremity edema. Recently discharged from SNF after long hospitalization at OSH in [place] with GIB from esophageal varices post banding.  8/17 Pt was extubated yesterday AM and consumed 50% of CLD for lunch. Diet further advanced to FLD yesterday afternoon. Pt sleeping at time of visit. He briefly awoke to name call but did not answer questions and quickly fell back to sleep. Visualized breakfast tray with 100% orange juice and jello consumed, all other items untouched.  Will order Ensure Enlive to supplement as pt is consuming minimal PO at this time. Not meeting needs. Medications reviewed. Labs reviewed; CBGs: 84-124 mg/dL, Cl: 161 mmol/L, K: 3.1 mmol/L, Ca: 7.5 mg/dL.  Needs have been re-estimated s/p extubation.    8/15 - Pt tolerating trickle feeds with no plan to advance rate - Pt intubated with MV: 17.2 L/min  Diet Order:  Diet full liquid Room service appropriate?: Yes; Fluid consistency:: Thin  Skin:  Reviewed, no issues  Last BM:  8/17  Height:   Ht Readings from Last 1 Encounters:  02/13/15  (1.778 m)    Weight:   Wt Readings from Last 1 Encounters:  02/17/15 232 lb 2.3 oz (105.3 kg)    Ideal Body Weight:   75.5 kg  BMI:  Body mass index is 33.31 kg/(m^2).  Estimated Nutritional Needs:   Kcal:  1600-1800  Protein:  85-95 grams  Fluid:  2.1L/day  EDUCATION NEEDS:   No education needs identified at this time     Trenton Gammon, RD, LDN Inpatient Clinical Dietitian Pager # [phone] After hours/weekend pager # [phone]

## 2015-02-17 NOTE — Progress Notes (Signed)
[facility] ADULT ICU REPLACEMENT PROTOCOL FOR AM LAB REPLACEMENT ONLY  The patient does apply for the [facility] Adult ICU Electrolyte Replacment Protocol based on the criteria listed below:   1. Is GFR >/= 40 ml/min? Yes.    Patient's GFR today is >60 2. Is urine output >/= 0.5 ml/kg/hr for the last 6 hours? Yes.   Patient's UOP is .63 ml/kg/hr 3. Is BUN < 60 mg/dL? Yes.    Patient's BUN today is 6 4. Abnormal electrolyte  K 3.1 5. Ordered repletion with: per protocol 6. If a panic level lab has been reported, has the CCM MD in charge been notified? Yes.  .   Physician:  Feliz Beam 02/17/2015 6:37 AM

## 2015-02-17 NOTE — Progress Notes (Signed)
PULMONARY / CRITICAL CARE MEDICINE   Name: Francisco [name] MRN: [id number] DOB: [birth year]    ADMISSION DATE:  02/06/2015 CONSULTATION DATE:  02/06/15  REFERRING MD :  ED  CHIEF COMPLAINT:  Variceal Bleed & CHF  INITIAL PRESENTATION: Patient admitted with worsening dyspnea and lower extremity edema. Recently discharged from SNF after long hospitalization at OSH in [place] with GIB from esophageal varices post banding.  STUDIES:  Portable CXR 8/6 - Peribronchial cuffing. No consolidation or effusion. TTE 8/7 - EF 55-60%. No wall motion abnormality. Grade 1 diastolic dysfunction. Hazy density on AV.  SIGNIFICANT EVENTS: 8/06  Admit to ICU 8/06  Transfuse 1u PRBCs 8/06  Elective intubation & Left IJ placed 8/06  EGD w/o varices but w/ small peptic ulcers 8/07  paracentesis 3.8 liters.  8/11  slowly increasing vasopressor requirement 8/12  Pink tinged output during lactulose enema overnight. On Fentanyl gtt for agitation, now patient comatose & nonresponsive now. Received 1 dose of Ativan in last 24 hours. Continues to require vasopressor support. 8/12  Paracentesis  8/13  Unresponsive most of the time and mostly breathes with vent per RN. S/p Paracentesis 2L yesterday and now with significant leak at puncture site - requiring ostomy bag to collect drainage. Not actively bleeing. Not on sedation. On neo with MAP> 65 and sbp 86. Ongoing adynamc ileus +  8/14  More responsive, 1L ascites leak overnight through bag, remains on neo 8/16  neo weaned off.  Pt on PSV 5/5, 30% and tolerating well.  Mouthing for ETT to be removed. RT reports ETT has moved in/out since yesterday due to holder.  750 ml out of para site in last 12 hours   SUBJECTIVE:  RN reports pt stable on RA, concerns for enlarging abdomen and decreased output from old para site.  Tmax 101.1.  VITAL SIGNS: Temp:  [96.6 F (35.9 C)-101.1 F (38.4 C)] 99.7 F (37.6 C) (08/17 0900) Pulse Rate:  [140] 140 (08/16 1712) Resp:   [16-28] 24 (08/17 0900) BP: (83-119)/(44-78) 110/64 mmHg (08/17 0900) SpO2:  [72 %-100 %] 97 % (08/17 0900) Weight:  [232 lb 2.3 oz (105.3 kg)] 232 lb 2.3 oz (105.3 kg) (08/17 0300)   HEMODYNAMICS:     VENTILATOR SETTINGS:     INTAKE / OUTPUT:  Intake/Output Summary (Last 24 hours) at 02/17/15 1037 Last data filed at 02/17/15 0700  Gross per 24 hour  Intake   3680 ml  Output   3235 ml  Net    445 ml    PHYSICAL EXAMINATION: General: chronically ill appearing adult male in NAD  Neuro:  Awake, alert, MAE/generalized weakness, speech clear HEENT:  Tacky MM. No scleral injection.  Cardiovascular:  s1s2 rrr, no m/r/g, palpable pulses Lungs:  Even/non-labored on RA, lungs bilaterally clear anterior, diminished lateral/bases Abdomen:  Protuberant. Bowel sounds +, Ostomy bag RLQ - draining ascites fluid/yellow Musculoskeletal:  No joint deformity or effusion.  Skin:  Warm & dry. No rash. No cyanosis.  LABS:   PULMONARY No results for input(s): PHART, PCO2ART, PO2ART, HCO3, TCO2, O2SAT in the last 168 hours.  Invalid input(s): PCO2, PO2  CBC  Recent Labs Lab 02/14/15 0400 02/16/15 0425 02/17/15 0520  HGB 7.8* 8.0* 7.1*  HCT 24.1* 24.4* 21.8*  WBC 11.0* 13.1* 16.3*  PLT 63* 66* 52*   COAGULATION  Recent Labs Lab 02/13/15 0320 02/14/15 0400 02/15/15 0530 02/16/15 0425 02/17/15 0520  INR 2.31* 2.49* 2.44* 2.80* 3.39*   CARDIAC  Recent Labs Lab 02/11/15 1309  02/11/15 1847 02/12/15 0007  TROPONINI <0.03 <0.03 <0.03   No results for input(s): PROBNP in the last 168 hours.  CHEMISTRY  Recent Labs Lab 02/13/15 0320 02/14/15 0400 02/15/15 0530 02/16/15 0425 02/17/15 0520  NA 142 141 141 142 142  K 3.2* 3.0* 3.1* 3.0* 3.1*  CL 114* 115* 115* 115* 116*  CO2 21* 21*  GLUCOSE 87 95 101* 103* 90  BUN CREATININE 1.09 0.95 0.88 0.92 1.00  CALCIUM 7.9* 7.6* 7.5* 7.5* 7.5*  MG 1.6* 1.6* 1.7 1.7 1.8  PHOS 3.2 2.9 2.5 2.1* 2.7    Estimated Creatinine Clearance: 106.2 mL/min (by C-G formula based on Cr of 1).  LIVER  Recent Labs Lab 02/13/15 0320 02/14/15 0400 02/15/15 0530 02/16/15 0425 02/17/15 0520  AST 40 39 45* 57* 61*  ALT 14* 15* 14* 17 18  ALKPHOS 57 53 57 62 56  BILITOT 2.4* 2.5* 2.2* 2.4* 2.1*  PROT 5.6* 5.5* 5.6* 6.0* 5.9*  ALBUMIN 1.9* 1.7* 1.7* 1.8* 1.8*  INR 2.31* 2.49* 2.44* 2.80* 3.39*   INFECTIOUS  Recent Labs Lab 02/14/15 0400  LATICACIDVEN 1.3   ENDOCRINE CBG (last 3)   Recent Labs  02/16/15 2345 02/17/15 0345 02/17/15 0736  GLUCAP 91 90 84    IMAGING x48h  - image(s) personally visualized  -   highlighted in bold Dg Chest Port 1 View  02/17/2015   CLINICAL DATA:  Respiratory failure .  EXAM: PORTABLE CHEST - 1 VIEW  COMPARISON:  02/16/2015 .  FINDINGS: Interim extubation and removal of NG tube. Right PICC line stable position. Mediastinum and hilar structures normal. Cardiomegaly with mild pulmonary vascular prominence and interstitial prominence consistent mild congestive heart failure . No pleural effusion or pneumothorax.  IMPRESSION: 1. Interim removal of endotracheal tube and NG tube. Right PICC line in stable position. 2. Cardiomegaly with persistent diffuse interstitial prominence consistent with congestive heart failure. Pneumonitis cannot be excluded.   Electronically Signed   By: Maisie Fus  Register   On: 02/17/2015 07:07   Dg Chest Port 1 View  02/16/2015   CLINICAL DATA:  Hypoxia  EXAM: PORTABLE CHEST - 1 VIEW  COMPARISON:  February 15, 2015  FINDINGS: Endotracheal tube tip is 3.2 cm above the carina. Central catheter tip is in the right atrium. Nasogastric tube tip and side port are below the diaphragm. No pneumothorax.  There is mild interstitial edema. There is no airspace consolidation. Heart is normal in size and contour. Pulmonary vascularity are normal. No adenopathy. There is a probable bone island in the proximal right humerus, stable. There is arthropathy in  both shoulders.  IMPRESSION: Tube and catheter positions as described without pneumothorax. Mild generalized interstitial edema without airspace consolidation. No change in cardiac silhouette.   Electronically Signed   By: Bretta Bang III M.D.   On: 02/16/2015 07:10   Dg Chest Port 1 View  02/15/2015   CLINICAL DATA:  Cirrhosis, varices and gastrointestinal bleeding. Intubated patient.  EXAM: PORTABLE CHEST - 1 VIEW  COMPARISON:  Single view of the chest 02/14/2015 and 02/11/2015.  FINDINGS: Support tubes and lines are unchanged. Lung volumes are low but the lungs are clear. Heart size is normal. No pneumothorax or pleural effusion.  IMPRESSION: No change in support apparatus.  No acute disease.   Electronically Signed   By: Drusilla Kanner M.D.   On: 02/15/2015 15:15    ASSESSMENT / PLAN:  PULMONARY OETT 8/6 >> A: Acute Respiratory  Failure - initially intubated for upper endoscopy electively, altered mental status prohibits extubation. P:   Oxygen for saturations > 92% Intermittent CXR  Pulmonary hygiene: IS, mobilize  CARDIOVASCULAR L IJ CVC 8/6 >> 8/10 PICC 8/10 >> A:  Grade I Diastolic Dysfunction  Questionable AV Vegitation  Hypotension - Secondary to sedation vs Septic Shock Bradycardia - Secondary to Precedex Prolonged QTc P:  TEE for possible vegetation but would likely require transfer to [facility] ICU monitoring  Follow blood cultures to determine if TEE needed   RENAL A:   Acute on Chronic CKD - Unknown baseline creatinine. Previously on HD. UOP fluctuates.    Lactic Acidosis - Resolved Hypokalemia  Hypomagnesemia  P:   Monitor UOP / BMP  Replace electrolytes as indicated  KCL 8/17   GASTROINTESTINAL A:   GIB - Secondary to peptic ulcers, had been taking Aleve prior to admit. H/O Variceal Bleed - s/p OSH banding June 2016. 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  P:   Lactulose & xifaxan PO Protonix IV bid Monitor ascites volume leak Assess KUB to ensure no evolving ileus Bedside US with increased ascitic volume   HEMATOLOGIC A:   Anemia - secondary to GIB, table,no signs of active bleeding. Coagulopathy - Secondary to cirrhosis Thrombocytopenia - in setting of cirrhosis  P:  Transfuse for Hgb <7.0 or active bleeding SCDs for DVT prophylaxis No chemical DVT prophylaxis given GIB Trend platelets   INFECTIOUS A:   SBP - Bacillus species & Coag Neg Staph Possible Aortic Valve Vegetation  P:   Monitor fever curve / WBC TEE for possible AV vegetation, will discuss with Cardiology Repeat BC 8/16, will decide regarding TEE pending review  BCx2 8/6 >> neg Ascites 8/8>>>Bacillus species & Coag Neg Staph Cath tip culture 8/10 >> neg BCx2 8/16 >>   Rocephin, start date 8/6, day 11/x Vancomycin, 8/8 >> 8/11  ENDOCRINE A:   No acute issues. P:   Accuchecks q4hr with hepatic disease MD to be notified for BG <90 or >160 D5LR @ [facility]  NEUROLOGIC A:   Acute encephalopathy - Hepatic vs medication induced, previously on fentanyl gtt.  Off sedation > 48 hours on 8/15.  CT head negative P:   Continue lactulose / xifaxin  RASS goal: 0  Continue Thiamine IV daily   FAMILY  - Updates: Patient updated at bedside 8/17 on plan of care     Canary Brim, NP-C [place] [facility] Pgr: [phone] or if no answer [phone] 02/17/2015, 10:37 AM

## 2015-02-18 ENCOUNTER — Inpatient Hospital Stay (HOSPITAL_COMMUNITY): Payer: Medicaid - Out of State

## 2015-02-18 LAB — PROCALCITONIN: Procalcitonin: 0.14 ng/mL

## 2015-02-18 LAB — GLUCOSE, CAPILLARY
GLUCOSE-CAPILLARY: 101 mg/dL — AB (ref 65–99)
GLUCOSE-CAPILLARY: 90 mg/dL (ref 65–99)
GLUCOSE-CAPILLARY: 94 mg/dL (ref 65–99)
Glucose-Capillary: 96 mg/dL (ref 65–99)

## 2015-02-18 LAB — PROTIME-INR
INR: 3.63 — AB (ref 0.00–1.49)
PROTHROMBIN TIME: 35.3 s — AB (ref 11.6–15.2)

## 2015-02-18 LAB — BASIC METABOLIC PANEL
Anion gap: 6 (ref 5–15)
BUN: 5 mg/dL — AB (ref 6–20)
CO2: 19 mmol/L — ABNORMAL LOW (ref 22–32)
CREATININE: 0.94 mg/dL (ref 0.61–1.24)
Calcium: 7.4 mg/dL — ABNORMAL LOW (ref 8.9–10.3)
Chloride: 111 mmol/L (ref 101–111)
Glucose, Bld: 94 mg/dL (ref 65–99)
Potassium: 3.5 mmol/L (ref 3.5–5.1)
SODIUM: 136 mmol/L (ref 135–145)

## 2015-02-18 LAB — FIBRINOGEN: Fibrinogen: <60 mg/dL — CL (ref 204–475)

## 2015-02-18 LAB — CBC
HCT: 23.2 % — ABNORMAL LOW (ref 39.0–52.0)
Hemoglobin: 7.5 g/dL — ABNORMAL LOW (ref 13.0–17.0)
MCH: 28 pg (ref 26.0–34.0)
MCHC: 32.3 g/dL (ref 30.0–36.0)
MCV: 86.6 fL (ref 78.0–100.0)
PLATELETS: 62 10*3/uL — AB (ref 150–400)
RBC: 2.68 MIL/uL — AB (ref 4.22–5.81)
RDW: 16.5 % — AB (ref 11.5–15.5)
WBC: 14.7 10*3/uL — AB (ref 4.0–10.5)

## 2015-02-18 LAB — MAGNESIUM: Magnesium: 1.7 mg/dL (ref 1.7–2.4)

## 2015-02-18 LAB — PHOSPHORUS: Phosphorus: 2.4 mg/dL — ABNORMAL LOW (ref 2.5–4.6)

## 2015-02-18 LAB — APTT: APTT: 62 s — AB (ref 24–37)

## 2015-02-18 MED ORDER — PANTOPRAZOLE SODIUM 40 MG PO TBEC
40.0000 mg | DELAYED_RELEASE_TABLET | Freq: Two times a day (BID) | ORAL | Status: DC
  Administered 2015-02-18 – 2015-02-26 (×16): 40 mg via ORAL
  Filled 2015-02-18 (×18): qty 1

## 2015-02-18 MED ORDER — VANCOMYCIN HCL IN DEXTROSE 1-5 GM/200ML-% IV SOLN
1000.0000 mg | Freq: Two times a day (BID) | INTRAVENOUS | Status: DC
  Administered 2015-02-18 – 2015-02-20 (×4): 1000 mg via INTRAVENOUS
  Filled 2015-02-18 (×4): qty 200

## 2015-02-18 MED ORDER — SODIUM CHLORIDE 0.9 % IV BOLUS (SEPSIS)
500.0000 mL | Freq: Once | INTRAVENOUS | Status: AC
  Administered 2015-02-18: 500 mL via INTRAVENOUS

## 2015-02-18 MED ORDER — PIPERACILLIN-TAZOBACTAM 3.375 G IVPB
3.3750 g | Freq: Three times a day (TID) | INTRAVENOUS | Status: DC
  Administered 2015-02-18 – 2015-02-26 (×25): 3.375 g via INTRAVENOUS
  Filled 2015-02-18 (×22): qty 50

## 2015-02-18 MED ORDER — VANCOMYCIN HCL 10 G IV SOLR
2000.0000 mg | Freq: Once | INTRAVENOUS | Status: AC
  Administered 2015-02-18: 2000 mg via INTRAVENOUS
  Filled 2015-02-18: qty 2000

## 2015-02-18 NOTE — Progress Notes (Signed)
08182016/new onset of pna/temp 102.3/wbc 14.0/extubated on [id number]/high risk of reintubation/Rhonda Davis,RN,BSN,CCM

## 2015-02-18 NOTE — Progress Notes (Signed)
ANTIBIOTIC CONSULT NOTE - INITIAL  Pharmacy Consult for vancomycin and zosyn Indication: HCAP  No Known Allergies  Patient Measurements: Height: 5\' 10"  (177.8 cm) Weight: 229 lb 11.5 oz (104.2 kg) IBW/kg (Calculated) : 73  Vital Signs: Temp: 100.2 F (37.9 C) (08/18 1000) Temp Source: Core (Comment) (08/18 1000) BP: 112/74 mmHg (08/18 1000) Intake/Output from previous day: 08/17 0701 - 08/18 0700 In: 1930 [P.O.:1430; I.V.:450; IV Piggyback:50] Out: 1995 [Urine:600; Stool:200] Intake/Output from this shift: Total I/O In: 390 [P.O.:360; I.V.:30] Out: -   Labs:  Recent Labs  02/16/15 0425 02/17/15 0520 02/18/15 0550  WBC 13.1* 16.3* 14.7*  HGB 8.0* 7.1* 7.5*  PLT 66* 52* 62*  CREATININE 0.92 1.00 0.94   Estimated Creatinine Clearance: 112.4 mL/min (by C-G formula based on Cr of 0.94). No results for input(s): VANCOTROUGH, VANCOPEAK, VANCORANDOM, GENTTROUGH, GENTPEAK, GENTRANDOM, TOBRATROUGH, TOBRAPEAK, TOBRARND, AMIKACINPEAK, AMIKACINTROU, AMIKACIN in the last 72 hours.   Microbiology: Recent Results (from the past 720 hour(s))  Blood culture (routine x 2)     Status: None   Collection Time: 02/06/15 11:41 AM  Result Value Ref Range Status   Specimen Description BLOOD LEFT HAND  Final   Special Requests IN PEDIATRIC BOTTLE 2CC  Final   Culture   Final    NO GROWTH 5 DAYS Performed at [facility]    Report Status 02/11/2015 FINAL  Final  MRSA PCR Screening     Status: None   Collection Time: 02/06/15  1:00 PM  Result Value Ref Range Status   MRSA by PCR NEGATIVE NEGATIVE Final    Comment:        The GeneXpert MRSA Assay (FDA approved for NASAL specimens only), is one component of a comprehensive MRSA colonization surveillance program. It is not intended to diagnose MRSA infection nor to guide or monitor treatment for MRSA infections.   Blood culture (routine x 2)     Status: None   Collection Time: 02/06/15  6:40 PM  Result Value Ref Range  Status   Specimen Description BLOOD LEFT HAND  6 ML IN AEROBIC ONLY  Final   Special Requests NONE  Final   Culture   Final    NO GROWTH 5 DAYS Performed at [facility]    Report Status 02/12/2015 FINAL  Final  Culture, body fluid-bottle     Status: None   Collection Time: 02/07/15 12:46 PM  Result Value Ref Range Status   Specimen Description PERITONEAL  Final   Special Requests NONE  Final   Gram Stain   Final    GRAM POSITIVE COCCI IN CLUSTERS GRAM VARIABLE ROD ANAEROBIC BOTTLE ONLY CRITICAL RESULT CALLED TO, READ BACK BY AND VERIFIED WITH: R JOHNSON,RN AT 1446 02/08/15 BY L BENFIELD    Culture   Final    STAPHYLOCOCCUS SPECIES (COAGULASE NEGATIVE) THE SIGNIFICANCE OF ISOLATING THIS ORGANISM FROM A SINGLE SET OF BLOOD CULTURES WHEN MULTIPLE SETS ARE DRAWN IS UNCERTAIN. PLEASE NOTIFY THE MICROBIOLOGY DEPARTMENT WITHIN ONE WEEK IF SPECIATION AND  SENSITIVITIES ARE REQUIRED. BACILLUS SPECIES Standardized susceptibility testing for this organism is not available. Performed at [facility]    Report Status FINAL FINAL  Final  Gram stain     Status: None   Collection Time: 02/07/15 12:46 PM  Result Value Ref Range Status   Specimen Description PERITONEAL  Final   Special Requests NONE  Final   Gram Stain   Final    CYTOSPIN SLIDE WBC PRESENT,BOTH PMN AND MONONUCLEAR NO  ORGANISMS SEEN Performed at [facility]    Report Status 02/07/2015 FINAL  Final  Cath Tip Culture     Status: None   Collection Time: 02/10/15 10:44 PM  Result Value Ref Range Status   Specimen Description CATH TIP  Final   Special Requests NONE  Final   Culture   Final    NO GROWTH 2 DAYS Performed at [facility]    Report Status 02/13/2015 FINAL  Final    Medical History: Past Medical History  Diagnosis Date  . Hepatic cirrhosis   . Esophageal varices     last banding in [place] June 2016  . Chronic renal failure     was on hemodialysis in June 2016  . Congestive  heart failure     questionable hx & on lasix  . Ascites     Assessment: Patient's a 49 y.o M with cirrhosis, ascites, and variceal bleeding (s/p banding) currently on ceftriaxone for SBP prophylaxis. To change abx to vancomycin and zosyn today for PNA.  Today, 02/18/2015: - Temp: 101.3 - WBC: down 14.7 - Renal: AKI -resolved  8/6 >> ceftriaxone >> (8/10 dose inc to 2gm)>>8/18 8/8 >> vancomycin >>  8/11>> resume 8/18 8/18 >> zosyn>>  8/18 CXR: alveolar opacity in the right upper lobe consistent with pneumonia  8/6 bloodx2: neg FINAL 8/7 peritoneal fluid: CNS FINAL 8/10 cath tip: neg FINAL   Goal of Therapy:  Vancomycin trough level 15-20 mcg/ml  Plan:  - zosyn 3.375 gm IV q8h (infuse over 4 hours) - vancomycin  IV x1 bolus, then 1000 mg IV q12h  Brynley Cuddeback P 02/18/2015,10:52 AM

## 2015-02-18 NOTE — Progress Notes (Signed)
PULMONARY / CRITICAL CARE MEDICINE   Name: Francisco [name] MRN: [id number] DOB: 22-Apr-1966    ADMISSION DATE:  02/06/2015 CONSULTATION DATE:  02/06/15  REFERRING MD :  ED  CHIEF COMPLAINT:  Variceal Bleed & CHF  INITIAL PRESENTATION: Patient admitted with worsening dyspnea and lower extremity edema. Recently discharged from SNF after long hospitalization at OSH in [place] with GIB from esophageal varices post banding.  STUDIES:  Portable CXR 8/6 - Peribronchial cuffing. No consolidation or effusion. TTE 8/7 - EF 55-60%. No wall motion abnormality. Grade 1 diastolic dysfunction. Hazy density on AV.  SIGNIFICANT EVENTS: 8/06  Admit to ICU 8/06  Transfuse 1u PRBCs 8/06  Elective intubation & Left IJ placed 8/06  EGD w/o varices but w/ small peptic ulcers 8/07  paracentesis 3.8 liters.  8/11  slowly increasing vasopressor requirement 8/12  Pink tinged output during lactulose enema overnight. On Fentanyl gtt for agitation, now patient comatose & nonresponsive now. Received 1 dose of Ativan in last 24 hours. Continues to require vasopressor support. 8/12  Paracentesis  8/13  Unresponsive most of the time and mostly breathes with vent per RN. S/p Paracentesis 2L yesterday and now with significant leak at puncture site - requiring ostomy bag to collect drainage. Not actively bleeing. Not on sedation. On neo with MAP> 65 and sbp 86. Ongoing adynamc ileus +  8/14  More responsive, 1L ascites leak overnight through bag, remains on neo 8/16  neo weaned off.  Pt on PSV 5/5, 30% and tolerating well.  Mouthing for ETT to be removed. RT reports ETT has moved in/out since yesterday due to holder.  750 ml out of para site in last 12 hours 8/18 extubated stable, nad  SUBJECTIVE:  NAD  VITAL SIGNS: Temp:  [99.5 F (37.5 C)-101.3 F (38.5 C)] 100 F (37.8 C) (08/18 0600) Resp:  [20-28] 26 (08/18 0600) BP: (81-126)/(50-82) 109/82 mmHg (08/18 0400) SpO2:  [96 %-100 %] 100 % (08/18 0600) Weight:   [229 lb 11.5 oz (104.2 kg)] 229 lb 11.5 oz (104.2 kg) (08/18 0431)   HEMODYNAMICS:     VENTILATOR SETTINGS:     INTAKE / OUTPUT:  Intake/Output Summary (Last 24 hours) at 02/18/15 0814 Last data filed at 02/18/15 0600  Gross per 24 hour  Intake   1820 ml  Output   1895 ml  Net    -75 ml    PHYSICAL EXAMINATION: General: chronically ill appearing adult male in NAD  Neuro:  Awake, alert, MAE/generalized weakness, speech clear HEENT:  Tacky MM. Cardiovascular:  s1s2 rrr, no m/r/g, palpable pulses Lungs:  Even/non-labored on RA, lungs bilaterally clear anterior, diminished lateral/bases Abdomen:  Protuberant. Bowel sounds +, Ostomy bag RLQ - draining ascites fluid/yellow Musculoskeletal:  No joint deformity or effusion.  Skin:  Warm & dry. No rash. No cyanosis.  LABS:   PULMONARY No results for input(s): PHART, PCO2ART, PO2ART, HCO3, TCO2, O2SAT in the last 168 hours.  Invalid input(s): PCO2, PO2  CBC  Recent Labs Lab 02/16/15 0425 02/17/15 0520 02/18/15 0550  HGB 8.0* 7.1* 7.5*  HCT 24.4* 21.8* 23.2*  WBC 13.1* 16.3* 14.7*  PLT 66* 52* 62*   COAGULATION  Recent Labs Lab 02/14/15 0400 02/15/15 0530 02/16/15 0425 02/17/15 0520 02/18/15 0550  INR 2.49* 2.44* 2.80* 3.39* 3.63*   CARDIAC  Recent Labs Lab 02/11/15 1309 02/11/15 1847 02/12/15 0007  TROPONINI <0.03 <0.03 <0.03   No results for input(s): PROBNP in the last 168 hours.  CHEMISTRY  Recent Labs Lab  02/14/15 0400 02/15/15 0530 02/16/15 0425 02/17/15 0520 02/18/15 0550  NA 141 141 142 142 136  K 3.0* 3.1* 3.0* 3.1* 3.5  CL 115* 115* 115* 116* 111  CO2 22 22 21* 21* 19*  GLUCOSE 95 101* 103* 90 94  BUN 5*  CREATININE 0.95 0.88 0.92 1.00 0.94  CALCIUM 7.6* 7.5* 7.5* 7.5* 7.4*  MG 1.6* 1.7 1.7 1.8 1.7  PHOS 2.9 2.5 2.1* 2.7 2.4*   Estimated Creatinine Clearance: 112.4 mL/min (by C-G formula based on Cr of 0.94).  LIVER  Recent Labs Lab 02/13/15 0320 02/14/15 0400  02/15/15 0530 02/16/15 0425 02/17/15 0520 02/18/15 0550  AST 40 39 45* 57* 61*  --   ALT 14* 15* 14* 17 18  --   ALKPHOS 57 53 57 62 56  --   BILITOT 2.4* 2.5* 2.2* 2.4* 2.1*  --   PROT 5.6* 5.5* 5.6* 6.0* 5.9*  --   ALBUMIN 1.9* 1.7* 1.7* 1.8* 1.8*  --   INR 2.31* 2.49* 2.44* 2.80* 3.39* 3.63*   INFECTIOUS  Recent Labs Lab 02/14/15 0400  LATICACIDVEN 1.3   ENDOCRINE CBG (last 3)   Recent Labs  02/17/15 1730 02/17/15 2124 02/18/15 0745  GLUCAP 94 110* 101*    IMAGING x48h  - image(s) personally visualized  -   highlighted in bold Dg Chest Port 1 View  02/18/2015   CLINICAL DATA:  Respiratory failure, cirrhosis, encephalopathy.  EXAM: PORTABLE CHEST - 1 VIEW  COMPARISON:  Portable chest x-ray of February 17, 2015  FINDINGS: The lungs are slightly less well inflated today. Confluent alveolar opacity is more conspicuous in the right upper lobe. The interstitial markings elsewhere in both lungs remain mildly increased. The cardiac silhouette is top-normal in size. The pulmonary vascularity is not engorged. The PICC line tip projects over the junction of the middle and distal SVC.  IMPRESSION: Slight interval increase in conspicuity of alveolar opacity in the right upper lobe consistent with pneumonia. Mild interstitial edema is stable.   Electronically Signed   By: David  [place] M.D.   On: 02/18/2015 07:06   Dg Chest Port 1 View  02/17/2015   CLINICAL DATA:  Respiratory failure .  EXAM: PORTABLE CHEST - 1 VIEW  COMPARISON:  02/16/2015 .  FINDINGS: Interim extubation and removal of NG tube. Right PICC line stable position. Mediastinum and hilar structures normal. Cardiomegaly with mild pulmonary vascular prominence and interstitial prominence consistent mild congestive heart failure . No pleural effusion or pneumothorax.  IMPRESSION: 1. Interim removal of endotracheal tube and NG tube. Right PICC line in stable position. 2. Cardiomegaly with persistent diffuse interstitial prominence  consistent with congestive heart failure. Pneumonitis cannot be excluded.   Electronically Signed   By: Maisie Fus  Register   On: 02/17/2015 07:07   Dg Abd Portable 1v  02/17/2015   CLINICAL DATA:  Abdominal distention  EXAM: PORTABLE ABDOMEN - 1 VIEW  COMPARISON:  February 13, 2015  FINDINGS: There is no demonstrable bowel dilatation. No air-fluid levels are seen on this supine examination. No free air appreciable. No abnormal calcifications.  IMPRESSION: Bowel gas pattern overall unremarkable.   Electronically Signed   By: Bretta Bang III M.D.   On: 02/17/2015 11:16    ASSESSMENT / PLAN:  PULMONARY OETT 8/6 >> A: Acute Respiratory Failure - initially intubated for upper endoscopy electively, altered mental status prohibits extubation. P:   Oxygen for saturations > 92% Intermittent CXR  Pulmonary hygiene: IS, mobilize  CARDIOVASCULAR  L IJ CVC 8/6 >> 8/10 PICC 8/10 >> A:  Grade I Diastolic Dysfunction  Questionable AV Vegitation  Hypotension - Secondary to sedation vs Septic Shock Bradycardia - Secondary to Precedex Prolonged QTc P:  TEE for possible vegetation but would likely require transfer to [facility] ICU monitoring  Follow blood cultures to determine if TEE needed   RENAL Lab Results  Component Value Date   CREATININE 0.94 02/18/2015   CREATININE 1.00 02/17/2015   CREATININE 0.92 02/16/2015    A:   Acute on Chronic CKD - Unknown baseline creatinine. Previously on HD. UOP fluctuates.    Lactic Acidosis - Resolved Hypokalemia  Hypomagnesemia  P:   Monitor UOP / BMP  Replace electrolytes as indicated  KCL 8/17   GASTROINTESTINAL A:   GIB - Secondary to peptic ulcers, had been taking Aleve prior to admit. H/O Variceal Bleed - s/p OSH banding June 2016. 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  P:   Lactulose & xifaxan  PO Protonix IV bid Monitor ascites volume leak Assess KUB to ensure no evolving ileus Bedside US with increased ascitic volume   HEMATOLOGIC  Recent Labs  02/17/15 0520 02/18/15 0550  HGB 7.1* 7.5*    A:   Anemia - secondary to GIB, table,no signs of active bleeding. Coagulopathy - Secondary to cirrhosis Thrombocytopenia - in setting of cirrhosis  P:  Transfuse for Hgb <7.0 or active bleeding SCDs for DVT prophylaxis No chemical DVT prophylaxis given GIB Trend platelets   INFECTIOUS A:   SBP - Bacillus species & Coag Neg Staph Possible Aortic Valve Vegetation  P:   Monitor fever curve / WBC TEE for possible AV vegetation, will discuss with Cardiology Repeat BC 8/16, will decide regarding TEE pending review  BCx2 8/6 >> neg Ascites 8/8>>>Bacillus species & Coag Neg Staph Cath tip culture 8/10 >> neg BCx2 8/16 >> if were drawn>>  Rocephin, start date 8/6, day 12/x Vancomycin, 8/8 >> 8/11  ENDOCRINE CBG (last 3)   Recent Labs  02/17/15 1730 02/17/15 2124 02/18/15 0745  GLUCAP 94 110* 101*     A:   No acute issues. P:   Accuchecks q4hr with hepatic disease MD to be notified for BG <90 or >160 D5LR @ [facility]  NEUROLOGIC A:   Acute encephalopathy - Hepatic vs medication induced, previously on fentanyl gtt.  Off sedation > 48 hours on 8/15.  CT head negative. Resolved 8/18 P:   Continue lactulose / xifaxin  RASS goal: 0  Continue Thiamine IV daily   FAMILY  - Updates: Patient updated at bedside 8/17 on plan of care    Global: 8/18 will change to SDU status, consider transfer to [facility] service and PCCM available as needed.    Brett Canales Minor ACNP [facility] PCCM Pager [phone] till 3 pm If no answer page [phone] 02/18/2015, 8:14 AM

## 2015-02-18 NOTE — Progress Notes (Signed)
eLink Physician-Brief Progress Note Patient Name: Francisco [name] DOB: [birth year] MRN: [id number]   Date of Service  02/18/2015  HPI/Events of Note  Oliguria.  eICU Interventions  Bolus with 0.9 NaCl 1 liter IV over 1 hour now.      Intervention Category Intermediate Interventions: Oliguria - evaluation and management  Sommer,Steven Eugene 02/18/2015, 4:53 PM

## 2015-02-19 ENCOUNTER — Inpatient Hospital Stay (HOSPITAL_COMMUNITY): Payer: Medicaid - Out of State

## 2015-02-19 LAB — PROTIME-INR
INR: 4.01 — AB (ref 0.00–1.49)
PROTHROMBIN TIME: 38.1 s — AB (ref 11.6–15.2)

## 2015-02-19 LAB — GLUCOSE, CAPILLARY
GLUCOSE-CAPILLARY: 110 mg/dL — AB (ref 65–99)
GLUCOSE-CAPILLARY: 111 mg/dL — AB (ref 65–99)
GLUCOSE-CAPILLARY: 138 mg/dL — AB (ref 65–99)
GLUCOSE-CAPILLARY: 76 mg/dL (ref 65–99)
Glucose-Capillary: 94 mg/dL (ref 65–99)
Glucose-Capillary: 111 mg/dL — ABNORMAL HIGH (ref 65–99)

## 2015-02-19 LAB — PROCALCITONIN
Procalcitonin: 0.16 ng/mL
Procalcitonin: 0.11 ng/mL

## 2015-02-19 LAB — BASIC METABOLIC PANEL
ANION GAP: 5 (ref 5–15)
BUN: 5 mg/dL — ABNORMAL LOW (ref 6–20)
CALCIUM: 7.4 mg/dL — AB (ref 8.9–10.3)
CHLORIDE: 110 mmol/L (ref 101–111)
CO2: 20 mmol/L — AB (ref 22–32)
Creatinine, Ser: 0.88 mg/dL (ref 0.61–1.24)
GFR calc non Af Amer: >60 mL/min (ref >60)
GLUCOSE: 96 mg/dL (ref 65–99)
Potassium: 3.3 mmol/L — ABNORMAL LOW (ref 3.5–5.1)
Sodium: 135 mmol/L (ref 135–145)

## 2015-02-19 LAB — PHOSPHORUS: PHOSPHORUS: 2.4 mg/dL — AB (ref 2.5–4.6)

## 2015-02-19 LAB — APTT: aPTT: 68 seconds — ABNORMAL HIGH (ref 24–37)

## 2015-02-19 LAB — MAGNESIUM: MAGNESIUM: 1.7 mg/dL (ref 1.7–2.4)

## 2015-02-19 LAB — FIBRINOGEN: Fibrinogen: <60 mg/dL — CL (ref 204–475)

## 2015-02-19 MED ORDER — FUROSEMIDE 40 MG PO TABS
40.0000 mg | ORAL_TABLET | Freq: Every day | ORAL | Status: DC
  Administered 2015-02-19 – 2015-02-24 (×6): 40 mg via ORAL
  Filled 2015-02-19 (×6): qty 1

## 2015-02-19 MED ORDER — DEXTROSE 5 % IV SOLN
10.0000 mmol | Freq: Once | INTRAVENOUS | Status: AC
  Administered 2015-02-19: 10 mmol via INTRAVENOUS
  Filled 2015-02-19: qty 3.33

## 2015-02-19 NOTE — Progress Notes (Signed)
CRITICAL VALUE ALERT  Critical value received: Fibrinogen <60  Date of notification:  02/19/15  Time of notification:  0748  Critical value read back: Yes  Nurse who received alert:  Addison Naegeli, RN  MD notified (1st page):  Mannam  Time of first page:  [phone]  MD notified (2nd page):  Time of second page:  Responding MD:    Time MD responded:

## 2015-02-19 NOTE — Progress Notes (Signed)
PULMONARY / CRITICAL CARE MEDICINE   Name: Francisco [name] MRN: [id number] DOB: Jul 09, 1965    ADMISSION DATE:  02/06/2015 CONSULTATION DATE:  02/06/15  REFERRING MD :  ED  CHIEF COMPLAINT:  Variceal Bleed & CHF  INITIAL PRESENTATION: Patient admitted with worsening dyspnea and lower extremity edema. Recently discharged from SNF after long hospitalization at OSH in [place] with GIB from esophageal varices post banding.  STUDIES:  Portable CXR 8/6 - Peribronchial cuffing. No consolidation or effusion. TTE 8/7 - EF 55-60%. No wall motion abnormality. Grade 1 diastolic dysfunction. Hazy density on AV.  SIGNIFICANT EVENTS: 8/06  Admit to ICU 8/06  Transfuse 1u PRBCs 8/06  Elective intubation & Left IJ placed 8/06  EGD w/o varices but w/ small peptic ulcers 8/07  paracentesis 3.8 liters.  8/11  slowly increasing vasopressor requirement 8/12  Pink tinged output during lactulose enema overnight. On Fentanyl gtt for agitation, now patient comatose & nonresponsive now. Received 1 dose of Ativan in last 24 hours. Continues to require vasopressor support. 8/12  Paracentesis  8/13  Unresponsive most of the time and mostly breathes with vent per RN. S/p Paracentesis 2L yesterday and now with significant leak at puncture site - requiring ostomy bag to collect drainage. Not actively bleeing. Not on sedation. On neo with MAP> 65 and sbp 86. Ongoing adynamc ileus +  8/14  More responsive, 1L ascites leak overnight through bag, remains on neo 8/16  neo weaned off.  Pt on PSV 5/5, 30% and tolerating well.  Mouthing for ETT to be removed. RT reports ETT has moved in/out since yesterday due to holder.  750 ml out of para site in last 12 hours 8/18 extubated stable, nad  SUBJECTIVE:  NAD  VITAL SIGNS: Temp:  [99 F (37.2 C)-99.7 F (37.6 C)] 99 F (37.2 C) (08/19 0800) Resp:  [19-29] 24 (08/19 0800) BP: (81-113)/(51-80) 94/62 mmHg (08/19 0800) SpO2:  [97 %-100 %] 97 % (08/19 0700) Weight:  [244 lb  11.4 oz (111 kg)] 244 lb 11.4 oz (111 kg) (08/19 0400)   HEMODYNAMICS:     VENTILATOR SETTINGS:     INTAKE / OUTPUT:  Intake/Output Summary (Last 24 hours) at 02/19/15 1006 Last data filed at 02/18/15 1800  Gross per 24 hour  Intake   1370 ml  Output    590 ml  Net    780 ml    PHYSICAL EXAMINATION: General: chronically ill appearing adult male in NAD  Neuro:  Awake, alert, MAE/generalized weakness, speech clear, increased confusion HEENT:  Tacky MM. Cardiovascular:  s1s2 rrr, no m/r/g, palpable pulses Lungs:  Even/non-labored on RA, lungs bilaterally clear anterior, diminished lateral/bases Abdomen:  Protuberant. Bowel sounds +, Ostomy bag RLQ - draining ascites fluid/yellow 400cc x 8 hrs Musculoskeletal:  No joint deformity or effusion.  Skin:  Warm & dry. No rash. No cyanosis.  LABS:   PULMONARY No results for input(s): PHART, PCO2ART, PO2ART, HCO3, TCO2, O2SAT in the last 168 hours.  Invalid input(s): PCO2, PO2  CBC  Recent Labs Lab 02/16/15 0425 02/17/15 0520 02/18/15 0550  HGB 8.0* 7.1* 7.5*  HCT 24.4* 21.8* 23.2*  WBC 13.1* 16.3* 14.7*  PLT 66* 52* 62*   COAGULATION  Recent Labs Lab 02/15/15 0530 02/16/15 0425 02/17/15 0520 02/18/15 0550 02/19/15 0630  INR 2.44* 2.80* 3.39* 3.63* 4.01*   CARDIAC No results for input(s): TROPONINI in the last 168 hours. No results for input(s): PROBNP in the last 168 hours.  CHEMISTRY  Recent Labs Lab  02/15/15 0530 02/16/15 0425 02/17/15 0520 02/18/15 0550 02/19/15 0630  NA 141 142 142 136 135  K 3.1* 3.0* 3.1* 3.5 3.3*  CL 115* 115* 116* 111 110  CO2 22 21* 21* 19* 20*  GLUCOSE 101* 103* 90 94 96  BUN 5* 5*  CREATININE 0.88 0.92 1.00 0.94 0.88  CALCIUM 7.5* 7.5* 7.5* 7.4* 7.4*  MG 1.7 1.7 1.8 1.7 1.7  PHOS 2.5 2.1* 2.7 2.4* 2.4*   Estimated Creatinine Clearance: 123.9 mL/min (by C-G formula based on Cr of 0.88).  LIVER  Recent Labs Lab 02/13/15 0320 02/14/15 0400 02/15/15 0530  02/16/15 0425 02/17/15 0520 02/18/15 0550 02/19/15 0630  AST 40 39 45* 57* 61*  --   --   ALT 14* 15* 14* 17 18  --   --   ALKPHOS 57 53 57 62 56  --   --   BILITOT 2.4* 2.5* 2.2* 2.4* 2.1*  --   --   PROT 5.6* 5.5* 5.6* 6.0* 5.9*  --   --   ALBUMIN 1.9* 1.7* 1.7* 1.8* 1.8*  --   --   INR 2.31* 2.49* 2.44* 2.80* 3.39* 3.63* 4.01*   INFECTIOUS  Recent Labs Lab 02/14/15 0400 02/18/15 1400 02/19/15 0630  LATICACIDVEN 1.3  --   --   PROCALCITON  --  0.14 0.16   ENDOCRINE CBG (last 3)   Recent Labs  02/18/15 1956 02/18/15 2354 02/19/15 0359  GLUCAP 94 76 94    IMAGING x48h  - image(s) personally visualized  -   highlighted in bold Dg Chest Port 1 View  02/19/2015   CLINICAL DATA:  Respiratory failure  EXAM: PORTABLE CHEST - 1 VIEW  COMPARISON:  02/18/2015  FINDINGS: Progressive interstitial and airspace opacity in the bilateral lungs, asymmetric to the right. No pleural effusion. Normal heart size for technique.  Stable right upper extremity PICC, tip at the upper right atrium.  IMPRESSION: Progressive bilateral lung opacity. Edema or pneumonia could have this appearance.   Electronically Signed   By: Marnee Spring M.D.   On: 02/19/2015 07:02   Dg Chest Port 1 View  02/18/2015   CLINICAL DATA:  Respiratory failure, cirrhosis, encephalopathy.  EXAM: PORTABLE CHEST - 1 VIEW  COMPARISON:  Portable chest x-ray of February 17, 2015  FINDINGS: The lungs are slightly less well inflated today. Confluent alveolar opacity is more conspicuous in the right upper lobe. The interstitial markings elsewhere in both lungs remain mildly increased. The cardiac silhouette is top-normal in size. The pulmonary vascularity is not engorged. The PICC line tip projects over the junction of the middle and distal SVC.  IMPRESSION: Slight interval increase in conspicuity of alveolar opacity in the right upper lobe consistent with pneumonia. Mild interstitial edema is stable.   Electronically Signed   By: David   [place] M.D.   On: 02/18/2015 07:06   Dg Abd Portable 1v  02/17/2015   CLINICAL DATA:  Abdominal distention  EXAM: PORTABLE ABDOMEN - 1 VIEW  COMPARISON:  February 13, 2015  FINDINGS: There is no demonstrable bowel dilatation. No air-fluid levels are seen on this supine examination. No free air appreciable. No abnormal calcifications.  IMPRESSION: Bowel gas pattern overall unremarkable.   Electronically Signed   By: Bretta Bang III M.D.   On: 02/17/2015 11:16    ASSESSMENT / PLAN:  PULMONARY OETT 8/6 >> A: Acute Respiratory Failure - initially intubated for upper endoscopy electively, altered mental status prohibits extubation. P:   Oxygen  for saturations > 92% Intermittent CXR  Pulmonary hygiene: IS, mobilize  CARDIOVASCULAR L IJ CVC 8/6 >> 8/10 PICC 8/10 >> A:  Grade I Diastolic Dysfunction  Questionable AV Vegitation  Hypotension - Secondary to sedation vs Septic Shock Bradycardia - Secondary to Precedex Prolonged QTc P:  TEE for possible vegetation but would likely require transfer to [facility] ICU monitoring  Follow blood cultures to determine if TEE needed   RENAL Lab Results  Component Value Date   CREATININE 0.88 02/19/2015   CREATININE 0.94 02/18/2015   CREATININE 1.00 02/17/2015    A:   Acute on Chronic CKD - Unknown baseline creatinine. Previously on HD. UOP fluctuates.    Lactic Acidosis - Resolved Hypokalemia  Hypomagnesemia  P:   Monitor UOP / BMP  Replace electrolytes as indicated  KCL 8/17   GASTROINTESTINAL A:   GIB - Secondary to peptic ulcers, had been taking Aleve prior to admit. H/O Variceal Bleed - s/p OSH banding June 2016. 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  P:   Lactulose & xifaxan PO Protonix IV bid Monitor ascites volume leak Assess KUB to ensure no evolving ileus Bedside US with increased  ascitic volume   HEMATOLOGIC  Recent Labs  02/17/15 0520 02/18/15 0550  HGB 7.1* 7.5*    A:   Anemia - secondary to GIB, table,no signs of active bleeding. Coagulopathy - Secondary to cirrhosis Thrombocytopenia - in setting of cirrhosis  P:  Transfuse for Hgb <7.0 or active bleeding SCDs for DVT prophylaxis No chemical DVT prophylaxis given GIB Trend platelets   INFECTIOUS A:   SBP - Bacillus species & Coag Neg Staph Possible Aortic Valve Vegetation  P:   Monitor fever curve / WBC TEE for possible AV vegetation, will discuss with Cardiology Repeat BC 8/16, will decide regarding TEE pending review  BCx2 8/6 >> neg Ascites 8/8>>>Bacillus species & Coag Neg Staph Cath tip culture 8/10 >> neg BCx2 8/16 >> if were drawn>>reordered 8/19>>  Rocephin, start date 8/6, day >>8/18 Vancomycin, 8/8 >> 8/11 restart 8/18 Pip-tazo 8/18>>  ENDOCRINE CBG (last 3)   Recent Labs  02/18/15 1956 02/18/15 2354 02/19/15 0359  GLUCAP 94 76 94     A:   No acute issues. P:   Accuchecks q4hr with hepatic disease MD to be notified for BG <90 or >160 D5LR @ [facility]  NEUROLOGIC A:   Acute encephalopathy - Hepatic vs medication induced, previously on fentanyl gtt.  Off sedation > 48 hours on 8/15.  CT head negative. Resolved 8/18, returned 8/19 P:   Continue lactulose / xifaxin  RASS goal: 0  Continue Thiamine IV daily   FAMILY  - Updates: Patient updated at bedside 8/17 on plan of care    Global: 8/18 will change to SDU status, abx broadened 8/18, more confused, will keep in sdu and on PCCM service for now.   Brett Canales Madina Galati ACNP [facility] PCCM Pager [phone] till 3 pm If no answer page [phone] 02/19/2015, 10:06 AM

## 2015-02-20 ENCOUNTER — Inpatient Hospital Stay (HOSPITAL_COMMUNITY): Payer: Medicaid - Out of State

## 2015-02-20 DIAGNOSIS — R931 Abnormal findings on diagnostic imaging of heart and coronary circulation: Secondary | ICD-10-CM | POA: Diagnosis present

## 2015-02-20 DIAGNOSIS — I359 Nonrheumatic aortic valve disorder, unspecified: Secondary | ICD-10-CM

## 2015-02-20 LAB — CBC
HEMATOCRIT: 20.7 % — AB (ref 39.0–52.0)
HEMOGLOBIN: 6.8 g/dL — AB (ref 13.0–17.0)
MCH: 27.8 pg (ref 26.0–34.0)
MCHC: 32.9 g/dL (ref 30.0–36.0)
MCV: 84.5 fL (ref 78.0–100.0)
Platelets: 57 10*3/uL — ABNORMAL LOW (ref 150–400)
RBC: 2.45 MIL/uL — ABNORMAL LOW (ref 4.22–5.81)
RDW: 16 % — AB (ref 11.5–15.5)
WBC: 11.8 10*3/uL — AB (ref 4.0–10.5)

## 2015-02-20 LAB — BASIC METABOLIC PANEL
ANION GAP: 5 (ref 5–15)
BUN: <5 mg/dL — ABNORMAL LOW (ref 6–20)
CALCIUM: 7.3 mg/dL — AB (ref 8.9–10.3)
CO2: 20 mmol/L — ABNORMAL LOW (ref 22–32)
CREATININE: 0.92 mg/dL (ref 0.61–1.24)
Chloride: 109 mmol/L (ref 101–111)
Glucose, Bld: 84 mg/dL (ref 65–99)
Potassium: 2.9 mmol/L — ABNORMAL LOW (ref 3.5–5.1)
SODIUM: 134 mmol/L — AB (ref 135–145)

## 2015-02-20 LAB — PROTIME-INR
INR: 3.86 — ABNORMAL HIGH (ref 0.00–1.49)
PROTHROMBIN TIME: 37 s — AB (ref 11.6–15.2)

## 2015-02-20 LAB — GLUCOSE, CAPILLARY
GLUCOSE-CAPILLARY: 106 mg/dL — AB (ref 65–99)
Glucose-Capillary: 89 mg/dL (ref 65–99)
Glucose-Capillary: 83 mg/dL (ref 65–99)
Glucose-Capillary: 115 mg/dL — ABNORMAL HIGH (ref 65–99)
Glucose-Capillary: 125 mg/dL — ABNORMAL HIGH (ref 65–99)

## 2015-02-20 LAB — HEMOGLOBIN AND HEMATOCRIT, BLOOD
HCT: 20.6 % — ABNORMAL LOW (ref 39.0–52.0)
HCT: 22.8 % — ABNORMAL LOW (ref 39.0–52.0)
Hemoglobin: 6.8 g/dL — CL (ref 13.0–17.0)
Hemoglobin: 7.8 g/dL — ABNORMAL LOW (ref 13.0–17.0)

## 2015-02-20 LAB — PROCALCITONIN: PROCALCITONIN: 0.1 ng/mL

## 2015-02-20 LAB — APTT: APTT: 68 s — AB (ref 24–37)

## 2015-02-20 LAB — PHOSPHORUS: Phosphorus: 2.7 mg/dL (ref 2.5–4.6)

## 2015-02-20 LAB — MAGNESIUM: Magnesium: 1.5 mg/dL — ABNORMAL LOW (ref 1.7–2.4)

## 2015-02-20 LAB — FIBRINOGEN

## 2015-02-20 LAB — VANCOMYCIN, TROUGH: VANCOMYCIN TR: 28 ug/mL — AB (ref 10.0–20.0)

## 2015-02-20 MED ORDER — MAGNESIUM SULFATE 2 GM/50ML IV SOLN
2.0000 g | Freq: Once | INTRAVENOUS | Status: AC
  Administered 2015-02-20: 2 g via INTRAVENOUS
  Filled 2015-02-20: qty 50

## 2015-02-20 MED ORDER — RIFAXIMIN 550 MG PO TABS
550.0000 mg | ORAL_TABLET | Freq: Two times a day (BID) | ORAL | Status: DC
  Administered 2015-02-20 – 2015-02-26 (×13): 550 mg via ORAL
  Filled 2015-02-20 (×14): qty 1

## 2015-02-20 MED ORDER — POTASSIUM CHLORIDE CRYS ER 20 MEQ PO TBCR
40.0000 meq | EXTENDED_RELEASE_TABLET | Freq: Two times a day (BID) | ORAL | Status: AC
  Administered 2015-02-20 (×2): 40 meq via ORAL
  Filled 2015-02-20 (×2): qty 2

## 2015-02-20 NOTE — Progress Notes (Signed)
eLink Physician-Brief Progress Note Patient Name: Francisco [name] DOB: June 07, 1966 MRN: [id number]   Date of Service  02/20/2015  HPI/Events of Note  Pt's anemia confirmed. Coagulopathy profound but stable. BP stable.  eICU Interventions  Repeat Hgb/Hct @ 1400 8/20     Intervention Category Intermediate Interventions: Coagulopathy - evaluation and management  Lawanda Cousins 02/20/2015, 6:31 AM

## 2015-02-20 NOTE — Progress Notes (Signed)
eLink Physician-Brief Progress Note Patient Name: Jeriel Vivanco DOB: 06/17/1966 MRN: [id number]   Date of Service  02/20/2015  HPI/Events of Note  RN notified Hgb 6.8. No signs of active bleeding. BP stable.  eICU Interventions  Stat Hgb/Hct and Type & screen.     Intervention Category Major Interventions: Other:  Lawanda Cousins 02/20/2015, 4:43 AM

## 2015-02-20 NOTE — Progress Notes (Signed)
  Echocardiogram 2D Echocardiogram has been performed.  Francisco [name] 02/20/2015, 10:45 AM

## 2015-02-20 NOTE — Progress Notes (Addendum)
PULMONARY / CRITICAL CARE MEDICINE   Name: Francisco [name] MRN: [id number] DOB: 05-11-1966    ADMISSION DATE:  02/06/2015 CONSULTATION DATE:  02/06/15  REFERRING MD :  ED  CHIEF COMPLAINT:  GI Bleed & CHF  INITIAL PRESENTATION: Patient admitted with worsening dyspnea and lower extremity edema. Recently discharged from SNF after long hospitalization at OSH in [place] with GIB from esophageal varices post banding.  STUDIES:  Portable CXR 8/6 - Peribronchial cuffing. No consolidation or effusion. TTE 8/7 - EF 55-60%. No wall motion abnormality. Grade 1 diastolic dysfunction. Hazy density on AV.  SIGNIFICANT EVENTS: 8/06  Admit to ICU 8/06  Transfuse 1u PRBCs 8/06  Elective intubation & Left IJ placed 8/06  EGD w/o varices but w/ small peptic ulcers 8/07  paracentesis 3.8 liters.  8/11  slowly increasing vasopressor requirement 8/12  Pink tinged output during lactulose enema overnight. On Fentanyl gtt for agitation, now patient comatose & nonresponsive now. Received 1 dose of Ativan in last 24 hours. Continues to require vasopressor support. 8/12  Paracentesis  8/13  Unresponsive most of the time and mostly breathes with vent per RN. S/p Paracentesis 2L yesterday and now with significant leak at puncture site - requiring ostomy bag to collect drainage. Not actively bleeing. Not on sedation. On neo with MAP> 65 and sbp 86. Ongoing adynamc ileus +  8/14  More responsive, 1L ascites leak overnight through bag, remains on neo 8/16  neo weaned off.  Pt on PSV 5/5, 30% and tolerating well.  Mouthing for ETT to be removed. RT reports ETT has moved in/out since yesterday due to holder.  750 ml out of para site in last 12 hours 8/18 extubated stable, nad  SUBJECTIVE:  NAD  VITAL SIGNS: Temp:  [98.5 F (36.9 C)-99.1 F (37.3 C)] 98.5 F (36.9 C) (08/19 2306) Resp:  [16-30] 18 (08/20 0400) BP: (87-116)/(54-80) 91/62 mmHg (08/20 0400) SpO2:  [48 %-100 %] 99 % (08/20 0400) Weight:  [238 lb  12.1 oz (108.3 kg)] 238 lb 12.1 oz (108.3 kg) (08/20 0500)    INTAKE / OUTPUT:  Intake/Output Summary (Last 24 hours) at 02/20/15 0840 Last data filed at 02/20/15 0700  Gross per 24 hour  Intake    803 ml  Output   1025 ml  Net   -222 ml    PHYSICAL EXAMINATION: General: chronically ill appearing adult male in NAD  Neuro:  Awake, alert, MAE/generalized weakness, speech clear, increased confusion HEENT:  Tacky MM. Cardiovascular:  s1s2 rrr, no m/r/g, palpable pulses Lungs:  Even/non-labored on RA, lungs bilaterally clear anterior, diminished lateral/bases Abdomen:  Protuberant. Bowel sounds +, Ostomy bag RLQ Musculoskeletal:  No joint deformity or effusion.  Skin:  Warm & dry. No rash. No cyanosis.  LABS:   PULMONARY No results for input(s): PHART, PCO2ART, PO2ART, HCO3, TCO2, O2SAT in the last 168 hours.  Invalid input(s): PCO2, PO2  CBC  Recent Labs Lab 02/17/15 0520 02/18/15 0550 02/20/15 0415 02/20/15 0538  HGB 7.1* 7.5* 6.8* 6.8*  HCT 21.8* 23.2* 20.7* 20.6*  WBC 16.3* 14.7* 11.8*  --   PLT 52* 62* 57*  --    COAGULATION  Recent Labs Lab 02/16/15 0425 02/17/15 0520 02/18/15 0550 02/19/15 0630 02/20/15 0415  INR 2.80* 3.39* 3.63* 4.01* 3.86*   CARDIAC No results for input(s): TROPONINI in the last 168 hours. No results for input(s): PROBNP in the last 168 hours.  CHEMISTRY  Recent Labs Lab 02/16/15 0425 02/17/15 0520 02/18/15 0550 02/19/15 0630 02/20/15  0415  NA 142 142 136 135 134*  K 3.0* 3.1* 3.5 3.3* 2.9*  CL 115* 116* 111 110 109  CO2 21* 21* 19* 20* 20*  GLUCOSE 103* 90 94 96 84  BUN 6 6 5* 5* <5*  CREATININE 0.92 1.00 0.94 0.88 0.92  CALCIUM 7.5* 7.5* 7.4* 7.4* 7.3*  MG 1.7 1.8 1.7 1.7 1.5*  PHOS 2.1* 2.7 2.4* 2.4* 2.7   Estimated Creatinine Clearance: 117 mL/min (by C-G formula based on Cr of 0.92).  LIVER  Recent Labs Lab 02/14/15 0400 02/15/15 0530 02/16/15 0425 02/17/15 0520 02/18/15 0550 02/19/15 0630  02/20/15 0415  AST 39 45* 57* 61*  --   --   --   ALT 15* 14* 17 18  --   --   --   ALKPHOS 53 57 62 56  --   --   --   BILITOT 2.5* 2.2* 2.4* 2.1*  --   --   --   PROT 5.5* 5.6* 6.0* 5.9*  --   --   --   ALBUMIN 1.7* 1.7* 1.8* 1.8*  --   --   --   INR 2.49* 2.44* 2.80* 3.39* 3.63* 4.01* 3.86*   INFECTIOUS  Recent Labs Lab 02/14/15 0400  02/19/15 0630 02/19/15 1329 02/20/15 0415  LATICACIDVEN 1.3  --   --   --   --   PROCALCITON  --   < > 0.16 0.11 0.10  < > = values in this interval not displayed. ENDOCRINE CBG (last 3)   Recent Labs  02/19/15 1329 02/19/15 1623 02/19/15 2020  GLUCAP 111* 111* 110*    IMAGING x48h  - image(s) personally visualized  -   highlighted in bold Dg Abd 1 View  02/19/2015   CLINICAL DATA:  Abdominal distension  EXAM: ABDOMEN - 1 VIEW  COMPARISON:  02/17/2015  FINDINGS: Mild small bowel dilatation is noted which is increased in the interval from the prior exam. Central crowding of the bowel markings is noted likely related to recurrent ascites. Some changes of anasarca are seen. No acute bony abnormality is noted.  IMPRESSION: Likely recurrent ascites.  New small bowel dilatation likely representing a partial small bowel obstruction. Correlation with the physical exam is recommended.   Electronically Signed   By: Alcide Clever M.D.   On: 02/19/2015 12:55   Dg Chest Port 1 View  02/20/2015   CLINICAL DATA:  Congestive failure  EXAM: PORTABLE CHEST - 1 VIEW  COMPARISON:  02/19/2015  FINDINGS: Cardiac shadow is stable. Right-sided PICC line is again identified and stable. Patchy opacities are again identified throughout both lungs. No sizable effusion or pneumothorax is noted.  IMPRESSION: Overall stable appearance of the chest when given some technical variations.   Electronically Signed   By: Alcide Clever M.D.   On: 02/20/2015 07:15   Dg Chest Port 1 View  02/19/2015   CLINICAL DATA:  Respiratory failure  EXAM: PORTABLE CHEST - 1 VIEW  COMPARISON:   02/18/2015  FINDINGS: Progressive interstitial and airspace opacity in the bilateral lungs, asymmetric to the right. No pleural effusion. Normal heart size for technique.  Stable right upper extremity PICC, tip at the upper right atrium.  IMPRESSION: Progressive bilateral lung opacity. Edema or pneumonia could have this appearance.   Electronically Signed   By: Marnee Spring M.D.   On: 02/19/2015 07:02    ASSESSMENT / PLAN:  PULMONARY OETT 8/6 >> A: Acute Respiratory Failure - initially intubated for upper endoscopy electively,  altered mental status prohibits extubation. P:   Oxygen for saturations > 92% Currently stable on room air. Intermittent CXR  Pulmonary hygiene: IS, mobilize  CARDIOVASCULAR L IJ CVC 8/6 >> 8/10 PICC 8/10 >> A:  Grade I Diastolic Dysfunction  Questionable AV Vegitation  Hypotension - Secondary to sedation vs Septic Shock Bradycardia - Secondary to Precedex Prolonged QTc P:  Will TEE for possible vegetation and  Now GPC bacteremia but would likely require transfer to [facility]. Will get cardiology to consult. ICU monitoring    RENAL Lab Results  Component Value Date   CREATININE 0.92 02/20/2015   CREATININE 0.88 02/19/2015   CREATININE 0.94 02/18/2015    A:   Acute on Chronic CKD - Unknown baseline creatinine. Previously on HD. UOP fluctuates.    Lactic Acidosis - Resolved Hypokalemia  Hypomagnesemia  P:   Monitor UOP / BMP  Replace electrolytes as indicated  KCL 8/17   GASTROINTESTINAL A:   GIB - Secondary to peptic ulcers, had been taking Aleve prior to admit. H/O Variceal Bleed - s/p OSH banding June 2016. 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  P:   Lactulose & xifaxan PO Protonix IV bid Monitor ascites volume leak Assess KUB to ensure no evolving ileus Bedside US with increased ascitic volume    HEMATOLOGIC  Recent Labs  02/20/15 0415 02/20/15 0538  HGB 6.8* 6.8*    A:   Anemia - secondary to GIB, table,no signs of active bleeding. Coagulopathy - Secondary to cirrhosis Thrombocytopenia - in setting of cirrhosis  P:  Transfuse for Hgb <7.0 or active bleeding SCDs for DVT prophylaxis No chemical DVT prophylaxis given GIB Trend platelets   INFECTIOUS A:   SBP - Bacillus species & Coag Neg Staph Possible Aortic Valve Vegetation  Now with GPC bacteremia  P:   Monitor fever curve / WBC TEE for possible AV vegetation, will discuss with Cardiology about TEE   BCx2 8/19 >> neg Ascites 8/8>>>Bacillus species & Coag Neg Staph Cath tip culture 8/10 >> neg BCx2 8/19>> GPCs  Rocephin, start date 8/6, day >>8/18 Vancomycin, 8/8 >> 8/11 restart 8/18 Pip-tazo 8/18>>  ENDOCRINE CBG (last 3)   Recent Labs  02/19/15 1329 02/19/15 1623 02/19/15 2020  GLUCAP 111* 111* 110*    A:   No acute issues. P:   Accuchecks q4hr with hepatic disease MD to be notified for BG <90 or >160 D5LR @ [facility]  NEUROLOGIC A:   Acute encephalopathy - Hepatic vs medication induced, previously on fentanyl gtt.  Off sedation > 48 hours on 8/15.  CT head negative. Resolved 8/18, returned 8/19 P:   Continue lactulose / xifaxin  RASS goal: 0  Continue Thiamine IV daily   FAMILY  - Updates: Patient updated at bedside 8/17 on plan of care    Global: 8/18 will change to SDU status, abx broadened 8/18, GPC bacteremia.  Pt is not critically ill  Chilton Greathouse MD [facility] Pager [phone] If no answer or after 3pm call: [phone] 02/20/2015, 9:09 AM

## 2015-02-20 NOTE — Progress Notes (Signed)
CRITICAL VALUE ALERT  Critical value received:  Fibrinogen <60  Date of notification:  02/20/2015  Time of notification:  0500  Critical value read back:Yes.    Nurse who received alert:  Gerilyn Pilgrim, RN  MD notified (1st page):  Lawanda Cousins  Time of first page:  0530  MD notified (2nd page):  Time of second page:  Responding MD:  Lawanda Cousins  Time MD responded:  [phone]

## 2015-02-20 NOTE — Progress Notes (Addendum)
CRITICAL VALUE ALERT  Critical value received:  Hgb 6.8  Date of notification:  02/20/2015  Time of notification:  0435  Critical value read back:Yes.    Nurse who received alert:  Starla Link, RN  MD notified (1st page): Lawanda Cousins  Time of first page:  [phone]  MD notified (2nd page):  Time of second page:  Responding MD:  Lawanda Cousins  Time MD responded:  [phone]

## 2015-02-20 NOTE — Progress Notes (Signed)
Critical value called at 0818 of positive blood cultures. MD notified

## 2015-02-20 NOTE — Progress Notes (Signed)
[facility] notified of repeat hgb 6.8. New orders received.   Starla Link, RN

## 2015-02-20 NOTE — Progress Notes (Addendum)
ANTIBIOTIC CONSULT NOTE - Follow Up  Pharmacy Consult for vancomycin and zosyn Indication: HCAP  No Known Allergies  Patient Measurements: Height:  (177.8 cm) Weight: 238 lb 12.1 oz (108.3 kg) IBW/kg (Calculated) : 73  Vital Signs: Temp: 98.3 F (36.8 C) (08/20 1200) Temp Source: Oral (08/20 1200) BP: 103/60 mmHg (08/20 0916) Intake/Output from previous day: 08/19 0701 - 08/20 0700 In: 803 [IV Piggyback:753] Out: 1025 [Urine:550; Stool:150] Intake/Output from this shift:    Labs:  Recent Labs  02/18/15 0550 02/19/15 0630 02/20/15 0415 02/20/15 0538  WBC 14.7*  --  11.8*  --   HGB 7.5*  --  6.8* 6.8*  PLT 62*  --  57*  --   CREATININE 0.94 0.88 0.92  --    Estimated Creatinine Clearance: 117 mL/min (by C-G formula based on Cr of 0.92). No results for input(s): VANCOTROUGH, VANCOPEAK, VANCORANDOM, GENTTROUGH, GENTPEAK, GENTRANDOM, TOBRATROUGH, TOBRAPEAK, TOBRARND, AMIKACINPEAK, AMIKACINTROU, AMIKACIN in the last 72 hours.   Microbiology: Recent Results (from the past 720 hour(s))  Blood culture (routine x 2)     Status: None   Collection Time: 02/06/15 11:41 AM  Result Value Ref Range Status   Specimen Description BLOOD LEFT HAND  Final   Special Requests IN PEDIATRIC BOTTLE 2CC  Final   Culture   Final    NO GROWTH 5 DAYS Performed at [facility]    Report Status 02/11/2015 FINAL  Final  MRSA PCR Screening     Status: None   Collection Time: 02/06/15  1:00 PM  Result Value Ref Range Status   MRSA by PCR NEGATIVE NEGATIVE Final    Comment:        The GeneXpert MRSA Assay (FDA approved for NASAL specimens only), is one component of a comprehensive MRSA colonization surveillance program. It is not intended to diagnose MRSA infection nor to guide or monitor treatment for MRSA infections.   Blood culture (routine x 2)     Status: None   Collection Time: 02/06/15  6:40 PM  Result Value Ref Range Status   Specimen Description BLOOD LEFT HAND   6 ML IN AEROBIC ONLY  Final   Special Requests NONE  Final   Culture   Final    NO GROWTH 5 DAYS Performed at [facility]    Report Status 02/12/2015 FINAL  Final  Culture, body fluid-bottle     Status: None   Collection Time: 02/07/15 12:46 PM  Result Value Ref Range Status   Specimen Description PERITONEAL  Final   Special Requests NONE  Final   Gram Stain   Final    GRAM POSITIVE COCCI IN CLUSTERS GRAM VARIABLE ROD ANAEROBIC BOTTLE ONLY CRITICAL RESULT CALLED TO, READ BACK BY AND VERIFIED WITH: R JOHNSON,RN AT 1446 02/08/15 BY L BENFIELD    Culture   Final    STAPHYLOCOCCUS SPECIES (COAGULASE NEGATIVE) THE SIGNIFICANCE OF ISOLATING THIS ORGANISM FROM A SINGLE SET OF BLOOD CULTURES WHEN MULTIPLE SETS ARE DRAWN IS UNCERTAIN. PLEASE NOTIFY THE MICROBIOLOGY DEPARTMENT WITHIN ONE WEEK IF SPECIATION AND  SENSITIVITIES ARE REQUIRED. BACILLUS SPECIES Standardized susceptibility testing for this organism is not available. Performed at [facility]    Report Status FINAL FINAL  Final  Gram stain     Status: None   Collection Time: 02/07/15 12:46 PM  Result Value Ref Range Status   Specimen Description PERITONEAL  Final   Special Requests NONE  Final   Gram Stain   Final  CYTOSPIN SLIDE WBC PRESENT,BOTH PMN AND MONONUCLEAR NO ORGANISMS SEEN Performed at [facility]    Report Status 02/07/2015 FINAL  Final  Cath Tip Culture     Status: None   Collection Time: 02/10/15 10:44 PM  Result Value Ref Range Status   Specimen Description CATH TIP  Final   Special Requests NONE  Final   Culture   Final    NO GROWTH 2 DAYS Performed at [facility]    Report Status 02/13/2015 FINAL  Final  Culture, blood (routine x 2)     Status: None (Preliminary result)   Collection Time: 02/19/15  1:25 PM  Result Value Ref Range Status   Specimen Description BLOOD LEFT ARM  Final   Special Requests IN PEDIATRIC BOTTLE 1CC  Final   Culture  Setup Time   Final     GRAM POSITIVE COCCI IN CLUSTERS AEROBIC BOTTLE ONLY CRITICAL RESULT CALLED TO, READ BACK BY AND VERIFIED WITH: C. LITTLE,RN AT [id number] ON [id number] BY Lucienne Capers    Culture   Final    Romie Minus POSITIVE COCCI Performed at [facility]    Report Status PENDING  Incomplete    Medical History: Past Medical History  Diagnosis Date  . Hepatic cirrhosis   . Esophageal varices     last banding in [place] June 2016  . Chronic renal failure     was on hemodialysis in June 2016  . Congestive heart failure     questionable hx & on lasix  . Ascites     Assessment: Patient's a 49 y.o M with cirrhosis, ascites, and variceal bleeding (s/p banding) currently on ceftriaxone for SBP prophylaxis. To change abx to vancomycin and zosyn today for PNA.  Today, 02/20/2015: - Temp: afebrile - WBC: improving - Renal: AKI -resolved  8/6 >> ceftriaxone >> (8/10 dose inc to 2gm)>>8/18 8/8 >> vancomycin >>  8/11>> resume 8/18 8/18 >> zosyn>>  8/18 CXR: alveolar opacity in the right upper lobe consistent with pneumonia  8/6 bloodx2: neg FINAL 8/7 peritoneal fluid: CNS FINAL 8/10 cath tip: neg FINAL   Goal of Therapy:  Vancomycin trough level 15-20 mcg/ml  Plan: D3 Antibiotics for HCAP - continue zosyn 3.375 gm IV q8h (infuse over 4 hours) - continue vancomycin 1000 mg IV q12h - check trough tonight prior to 11pm vanc dose   Hessie Knows, PharmD, BCPS Pager [phone] 02/20/2015 12:34 PM

## 2015-02-20 NOTE — Progress Notes (Signed)
CRITICAL VALUE ALERT  Critical value received:  Positive blood culture  Date of notification:  02/20/2015  Time of notification:  0818  Critical value read back:yes  Nurse who received alert:  Lutricia Horsfall RN  MD notified (1st page):  Dr. Isaiah Serge face to face  Time of first page:  0840  MD notified (2nd page):  Time of second page:  Responding MD:  Dr. Isaiah Serge  Time MD responded:  [phone] face to face

## 2015-02-20 NOTE — Progress Notes (Signed)
eLink Physician-Brief Progress Note Patient Name: Francisco [name] DOB: 12/30/1965 MRN: [id number]   Date of Service  02/20/2015  HPI/Events of Note  Hypokalemia & Hypomagnesemia  eICU Interventions  MagSulfate 2gm IV & KCl po x2 doses     Intervention Category Major Interventions: Electrolyte abnormality - evaluation and management  Lawanda Cousins 02/20/2015, 4:52 AM

## 2015-02-20 NOTE — Consult Note (Signed)
CARDIOLOGY CONSULT NOTE       Patient ID: Mubashir Mallek MRN: [id number] DOB/AGE: 1966/01/13 49 y.o.  Admit date: 02/06/2015 Referring Physician:  Roney Mans Primary Physician: Pcp Not In System Primary Cardiologist:  New Reason for Consultation:  Abnormal Echo ? SBE  Active Problems:   GI bleeding   Acute upper GI bleed   Cirrhosis   Acute blood loss anemia   Encephalopathy, hepatic   Ascites   Acute respiratory failure with hypoxia   Acute encephalopathy   Acute respiratory failure   HPI:  49 y.o.  With cirrhosis, ascites, CRF, respiratory failure.  Admitted by CCM 8/6 after over month long stay in [place].  Admitted with GI bleed , hypoxemia and worsening volume overload and ascites.  Reviewed echo from 8/7.  Normal EF. Dr Mayford Knife indicated some hazyness on aortic valve.  To my eye the AV is totally normal on PSL/PSS axis views with some reverberation artifact.  PSL 3 chamber views suggest small mobile density but plane looks off axis.  Patient has had complicated hospital course.  He has multiple comorbidities that are not ideal for TEE. Just extubated on 8/18.  Has continued tense abdomen with ascites despite large volume paracentesis.  Also has esophageal varices with recent GI bleed from GE ulcer.  Currently afebrile on antibiotics for GBC bacteremia.  No myalgias, rash or rigors.    ROS All other systems reviewed and negative except as noted above  Past Medical History  Diagnosis Date  . Hepatic cirrhosis   . Esophageal varices     last banding in [place] June 2016  . Chronic renal failure     was on hemodialysis in June 2016  . Congestive heart failure     questionable hx & on lasix  . Ascites     Family History  Problem Relation Age of Onset  . COPD Mother   . Asthma Mother   . Leukemia Maternal Grandmother   . Congestive Heart Failure Maternal Grandfather   . Cancer Other     2 aunts with known ca    Social History   Social History  . Marital Status:  Married    Spouse Name: N/A  . Number of Children: N/A  . Years of Education: N/A   Occupational History  . Not on file.   Social History Main Topics  . Smoking status: Former Smoker    Quit date: 10/02/2014  . Smokeless tobacco: Not on file     Comment: off and on for 20 years  . Alcohol Use: No     Comment: Quit drinking EtOH prior to prolonged admission in April 2016  . Drug Use: Not on file  . Sexual Activity: Not on file   Other Topics Concern  . Not on file   Social History Narrative   [place] is from [place]. Hospitalized with variceal bleed and banding this Spring and discharged to [facility] June 2016. Discharged from SNF 1 week ago. Previously worked as a Paediatric nurse. Married.    Past Surgical History  Procedure Laterality Date  . Esophagogastroduodenoscopy w/ banding    . Esophagogastroduodenoscopy N/A 02/06/2015    Procedure: ESOPHAGOGASTRODUODENOSCOPY (EGD);  Surgeon: Rachael Fee, MD;  Location: [facility];  Service: Endoscopy;  Laterality: N/A;  at bedside in ICU     . feeding supplement (ENSURE ENLIVE)  237 mL Oral BID BM  . furosemide  40 mg Oral Daily  . lactulose  10 g Oral TID  . pantoprazole  40 mg  Oral BID  . piperacillin-tazobactam (ZOSYN)  IV  3.375 g Intravenous Q8H  . potassium chloride  40 mEq Oral BID  . rifaximin  550 mg Oral BID  . sodium chloride  10-40 mL Intracatheter Q12H  . thiamine  100 mg Oral Daily  . vancomycin  1,000 mg Intravenous Q12H   . dextrose 5% lactated ringers 10 mL/hr at 02/17/15 1800    Physical Exam: Blood pressure 103/60, pulse 140, temperature 98.5 F (36.9 C), temperature source Oral, resp. rate 18, height 5\' 10"  (1.778 m), weight 108.3 kg (238 lb 12.1 oz), SpO2 99 %.    Affect appropriate Chronically ill black male  HEENT: normal Neck supple with no adenopathy JVP normal no bruits no thyromegaly Lungs clear with no wheezing and good diaphragmatic motion Heart:  S1/S2 no murmur, no rub, gallop or click PMI  normal Abdomen:  Ascites tense distended and typanitic  no bruit.  No HSM or HJR Distal pulses intact with no bruits Plus 2 edema to mid thigh  Neuro non-focal Skin warm and dry No muscular weakness   Labs:   Lab Results  Component Value Date   WBC 11.8* 02/20/2015   HGB 6.8* 02/20/2015   HCT 20.6* 02/20/2015   MCV 84.5 02/20/2015   PLT 57* 02/20/2015    Recent Labs Lab 02/17/15 0520  02/20/15 0415  NA 142  < > 134*  K 3.1*  < > 2.9*  CL 116*  < > 109  CO2 21*  < > 20*  BUN 6  < > <5*  CREATININE 1.00  < > 0.92  CALCIUM 7.5*  < > 7.3*  PROT 5.9*  --   --   BILITOT 2.1*  --   --   ALKPHOS 56  --   --   ALT 18  --   --   AST 61*  --   --   GLUCOSE 90  < > 84  < > = values in this interval not displayed. Lab Results  Component Value Date   TROPONINI <0.03 02/12/2015   No results found for: CHOL No results found for: HDL No results found for: [facility] Lab Results  Component Value Date   TRIG 75 02/08/2015   No results found for: CHOLHDL No results found for: LDLDIRECT    Radiology: Dg Abd 1 View  02/19/2015   CLINICAL DATA:  Abdominal distension  EXAM: ABDOMEN - 1 VIEW  COMPARISON:  02/17/2015  FINDINGS: Mild small bowel dilatation is noted which is increased in the interval from the prior exam. Central crowding of the bowel markings is noted likely related to recurrent ascites. Some changes of anasarca are seen. No acute bony abnormality is noted.  IMPRESSION: Likely recurrent ascites.  New small bowel dilatation likely representing a partial small bowel obstruction. Correlation with the physical exam is recommended.   Electronically Signed   By: Alcide Clever M.D.   On: 02/19/2015 12:55   Dg Abd 1 View  02/09/2015   CLINICAL DATA:  Constipated, abdominal distension  EXAM: ABDOMEN - 1 VIEW  COMPARISON:  02/08/2015  FINDINGS: The bowel gas pattern is normal. No radio-opaque calculi or other significant radiographic abnormality are seen.  IMPRESSION: Negative.    Electronically Signed   By: Elige Ko   On: 02/09/2015 10:41   Dg Abd 1 View  02/08/2015   CLINICAL DATA:  Orogastric tube placement.  EXAM: ABDOMEN - 1 VIEW  COMPARISON:  None.  FINDINGS: The bowel gas pattern is  normal. Distal tip of feeding tube is seen in expected position of proximal stomach. No radio-opaque calculi or other significant radiographic abnormality are seen.  IMPRESSION: Distal tip of feeding tube seen in proximal stomach.   Electronically Signed   By: Lupita Raider, M.D.   On: 02/08/2015 13:34   Ct Head Wo Contrast  02/13/2015   CLINICAL DATA:  Acute encephalopathy  EXAM: CT HEAD WITHOUT CONTRAST  TECHNIQUE: Contiguous axial images were obtained from the base of the skull through the vertex without intravenous contrast.  COMPARISON:  None.  FINDINGS: The bony calvarium is intact. Mild atrophic changes are noted. No findings to suggest acute hemorrhage, acute infarction or space-occupying mass lesion are noted.  IMPRESSION: Mild atrophy.  No acute abnormality is noted.   Electronically Signed   By: Alcide Clever M.D.   On: 02/13/2015 15:39   US Renal  02/07/2015   CLINICAL DATA:  49 year old male with a history of acute renal failure, chronic renal failure, cirrhosis.  EXAM: RENAL / URINARY TRACT ULTRASOUND COMPLETE  COMPARISON:  None.  FINDINGS: Right Kidney:  Length: 12.3 cm. No evidence of right-sided hydronephrosis. Echogenicity similar to that of the visualized adjacent liver parenchyma. Flow confirmed in the hilum of the right kidney.  Left Kidney:  Length: 12.0 cm. Left kidney incompletely visualized, however, there does not appear to be significant hydronephrosis.  Moderate to large volume of ascites.  IMPRESSION: No evidence of left or right hydronephrosis to indicate obstruction.  Moderate to large volume of ascites.  Signed,  Yvone Neu. Loreta Ave, DO  [facility]  [facility]   Electronically Signed   By: Gilmer Mor D.O.   On:  02/07/2015 12:46   US Paracentesis  02/07/2015   CLINICAL DATA:  Abdominal distention. Alcoholic cirrhosis. Massive ascites. Request diagnostic and therapeutic paracentesis of up to 4 L max.  EXAM: ULTRASOUND GUIDED PARACENTESIS  COMPARISON:  None.  PROCEDURE: An ultrasound guided paracentesis was thoroughly discussed with the patient and questions answered. The benefits, risks, alternatives and complications were also discussed. The patient understands and wishes to proceed with the procedure. Written consent was obtained.  Ultrasound was performed to localize and mark an adequate pocket of fluid in the left lower quadrant of the abdomen. The area was then prepped and draped in the normal sterile fashion. 1% Lidocaine was used for local anesthesia. Under ultrasound guidance a 19 gauge Yueh catheter was introduced. Paracentesis was performed. The catheter was removed and a dressing applied.  COMPLICATIONS: None immediate  FINDINGS: A total of approximately 3.8 L of cloudy, yellow fluid was removed. A fluid sample was sent for laboratory analysis.  IMPRESSION: Successful ultrasound guided paracentesis yielding 3.8 L of ascites.  Read by: Brayton El PA-C   Electronically Signed   By: Gilmer Mor D.O.   On: 02/07/2015 12:08   Dg Chest Port 1 View  02/20/2015   CLINICAL DATA:  Congestive failure  EXAM: PORTABLE CHEST - 1 VIEW  COMPARISON:  02/19/2015  FINDINGS: Cardiac shadow is stable. Right-sided PICC line is again identified and stable. Patchy opacities are again identified throughout both lungs. No sizable effusion or pneumothorax is noted.  IMPRESSION: Overall stable appearance of the chest when given some technical variations.   Electronically Signed   By: Alcide Clever M.D.   On: 02/20/2015 07:15   Dg Chest Port 1 View  02/19/2015   CLINICAL DATA:  Respiratory failure  EXAM: PORTABLE CHEST - 1 VIEW  COMPARISON:  02/18/2015  FINDINGS: Progressive interstitial and airspace opacity in the bilateral  lungs, asymmetric to the right. No pleural effusion. Normal heart size for technique.  Stable right upper extremity PICC, tip at the upper right atrium.  IMPRESSION: Progressive bilateral lung opacity. Edema or pneumonia could have this appearance.   Electronically Signed   By: Marnee Spring M.D.   On: 02/19/2015 07:02   Dg Chest Port 1 View  02/18/2015   CLINICAL DATA:  Respiratory failure, cirrhosis, encephalopathy.  EXAM: PORTABLE CHEST - 1 VIEW  COMPARISON:  Portable chest x-ray of February 17, 2015  FINDINGS: The lungs are slightly less well inflated today. Confluent alveolar opacity is more conspicuous in the right upper lobe. The interstitial markings elsewhere in both lungs remain mildly increased. The cardiac silhouette is top-normal in size. The pulmonary vascularity is not engorged. The PICC line tip projects over the junction of the middle and distal SVC.  IMPRESSION: Slight interval increase in conspicuity of alveolar opacity in the right upper lobe consistent with pneumonia. Mild interstitial edema is stable.   Electronically Signed   By: David  [place] M.D.   On: 02/18/2015 07:06   Dg Chest Port 1 View  02/17/2015   CLINICAL DATA:  Respiratory failure .  EXAM: PORTABLE CHEST - 1 VIEW  COMPARISON:  02/16/2015 .  FINDINGS: Interim extubation and removal of NG tube. Right PICC line stable position. Mediastinum and hilar structures normal. Cardiomegaly with mild pulmonary vascular prominence and interstitial prominence consistent mild congestive heart failure . No pleural effusion or pneumothorax.  IMPRESSION: 1. Interim removal of endotracheal tube and NG tube. Right PICC line in stable position. 2. Cardiomegaly with persistent diffuse interstitial prominence consistent with congestive heart failure. Pneumonitis cannot be excluded.   Electronically Signed   By: Maisie Fus  Register   On: 02/17/2015 07:07   Dg Chest Port 1 View  02/16/2015   CLINICAL DATA:  Hypoxia  EXAM: PORTABLE CHEST - 1 VIEW   COMPARISON:  February 15, 2015  FINDINGS: Endotracheal tube tip is 3.2 cm above the carina. Central catheter tip is in the right atrium. Nasogastric tube tip and side port are below the diaphragm. No pneumothorax.  There is mild interstitial edema. There is no airspace consolidation. Heart is normal in size and contour. Pulmonary vascularity are normal. No adenopathy. There is a probable bone island in the proximal right humerus, stable. There is arthropathy in both shoulders.  IMPRESSION: Tube and catheter positions as described without pneumothorax. Mild generalized interstitial edema without airspace consolidation. No change in cardiac silhouette.   Electronically Signed   By: Bretta Bang III M.D.   On: 02/16/2015 07:10   Dg Chest Port 1 View  02/15/2015   CLINICAL DATA:  Cirrhosis, varices and gastrointestinal bleeding. Intubated patient.  EXAM: PORTABLE CHEST - 1 VIEW  COMPARISON:  Single view of the chest 02/14/2015 and 02/11/2015.  FINDINGS: Support tubes and lines are unchanged. Lung volumes are low but the lungs are clear. Heart size is normal. No pneumothorax or pleural effusion.  IMPRESSION: No change in support apparatus.  No acute disease.   Electronically Signed   By: Drusilla Kanner M.D.   On: 02/15/2015 15:15   Dg Chest Port 1 View  02/14/2015   CLINICAL DATA:  Acute encephalopathy  EXAM: PORTABLE CHEST - 1 VIEW  COMPARISON:  02/11/2015  FINDINGS: Cardiomediastinal silhouette is stable. Again noted NG tube and endotracheal tube unchanged in position. No acute infiltrate or pulmonary edema. Hypoinflation again noted.  IMPRESSION: No active disease. Stable support apparatus. Hypoinflation again noted.   Electronically Signed   By: Natasha Mead M.D.   On: 02/14/2015 09:04   Dg Chest Port 1 View  02/11/2015   CLINICAL DATA:  Respiratory failure, intubated patient.  EXAM: PORTABLE CHEST - 1 VIEW  COMPARISON:  Portable chest x-ray of February 09, 2015  FINDINGS: The lungs remain hypoinflated. The  interstitial markings remain diffusely increased but have improved slightly. The cardiac silhouette is mildly enlarged though stable. The central pulmonary vascularity prominent. The endotracheal tube tip lies 4.5 cm above the carina. The esophagogastric tube tip projects in the gastric cardia and is stable. There are degenerative changes of both shoulders.  IMPRESSION: Persistent bilateral hypoinflation. Slight interval improvement in the appearance of the pulmonary interstitium. There is no alveolar pneumonia. Support tubes are in reasonable position.   Electronically Signed   By: David  [place] M.D.   On: 02/11/2015 07:37   Dg Chest Port 1 View  02/09/2015   CLINICAL DATA:  Central line repositioning  EXAM: PORTABLE CHEST - 1 VIEW  COMPARISON:  February 07, 2015  FINDINGS: Central catheter tip is at the junction of the left innominate vein and superior vena cava. Endotracheal tube tip is 2.5 cm above carina. Nasogastric tube tip and side port are in the stomach. No pneumothorax. There is no edema or consolidation. Heart is upper normal in size with pulmonary vascularity within normal limits. No adenopathy. There is an apparent Hill-Sachs defect in the left humeral head. There is arthropathy in each shoulder.  IMPRESSION: Tube and catheter positions as described without pneumothorax. No edema or consolidation.   Electronically Signed   By: Bretta Bang III M.D.   On: 02/09/2015 02:41   Dg Chest Port 1 View  02/07/2015   CLINICAL DATA:  Withdrawal of central line  EXAM: PORTABLE CHEST - 1 VIEW  COMPARISON:  Portable exam 1348 hours compared to 02/06/2015  FINDINGS: LEFT jugular central venous catheter with tip projecting over cavoatrial junction.  Minimal enlargement of cardiac silhouette.  Pulmonary vascular congestion.  Perihilar infiltrates question edema versus infection.  Low lung volumes.  No pleural effusion or pneumothorax.  Tip of endotracheal tube at carina, recommend withdrawal 2 cm.  IMPRESSION:  Tip of LEFT jugular 9 not projects over cavoatrial junction.  Tip of endotracheal tube now at carina, recommend withdrawal 2 cm.  Perihilar infiltrates question edema versus infection.  Critical Value/emergent results were called by telephone at the time of interpretation on 02/07/2015 at 1403 hr to Onalee Hua RN (in ICU Stepdown), who verbally acknowledged these results.   Electronically Signed   By: Ulyses Southward M.D.   On: 02/07/2015 14:04   Dg Chest Port 1 View  02/06/2015   CLINICAL DATA:  Patient with stroke status post line placement.  EXAM: PORTABLE CHEST - 1 VIEW  COMPARISON:  Earlier same date  FINDINGS: Interval insertion of endotracheal tube terminating 15 mm superior to the carina. New left IJ central venous catheter tip projects over the right atrium. Stable cardiac and mediastinal contours. Low lung volumes. Unchanged diffuse bilateral coarse heterogeneous opacities. No pleural effusion or pneumothorax.  IMPRESSION: New left IJ central venous catheter tip projects over the right atrium, consider retraction.  ET tube terminates in the distal trachea.  Unchanged heterogeneous opacities bilaterally potentially secondary to bronchitis.  These results were called by telephone at the time of interpretation on 02/06/2015 at 4:37 pm to Dr. Lawanda Cousins , who verbally acknowledged these results.  Electronically Signed   By: Annia Belt M.D.   On: 02/06/2015 16:38   Dg Chest Port 1 View  02/06/2015   CLINICAL DATA:  49 year old male with shortness breath and chest pain for the past 12 hr. Vomiting.  EXAM: PORTABLE CHEST - 1 VIEW  COMPARISON:  No priors.  FINDINGS: Lung volumes are low. Severe diffuse peribronchial cuffing. No consolidative airspace disease. No pleural effusions. No evidence of pulmonary edema. Heart size is normal. Mediastinal contours are unremarkable.  IMPRESSION: 1. Severe diffuse peribronchial cuffing, concerning for severe acute bronchitis.   Electronically Signed   By: Trudie Reed M.D.    On: 02/06/2015 10:32   Dg Chest Port 1v Same Day  02/06/2015   CLINICAL DATA:  Hypoxia. Requested per MD to assess whether radiographic distortion affects prior imaging of IJ line and ETT  EXAM: PORTABLE CHEST - 1 VIEW SAME DAY  COMPARISON:  02/06/2015 at 1604 hours  FINDINGS: Left internal jugular central venous line catheter tip again projects in the right atrium.  Endotracheal tube tip projects 1 cm above the carina. This is also stable.  Lung volumes are low. There is interstitial prominence but no areas of lung consolidation and no overt pulmonary edema. No pneumothorax.  IMPRESSION: 1. No change from the earlier study. 2. Left internal jugular central venous line tip again projects in the right atrium. Endotracheal tube tip projects 1 cm above the carina.   Electronically Signed   By: Amie Portland M.D.   On: 02/06/2015 18:00   Dg Abd Portable 1v  02/17/2015   CLINICAL DATA:  Abdominal distention  EXAM: PORTABLE ABDOMEN - 1 VIEW  COMPARISON:  February 13, 2015  FINDINGS: There is no demonstrable bowel dilatation. No air-fluid levels are seen on this supine examination. No free air appreciable. No abnormal calcifications.  IMPRESSION: Bowel gas pattern overall unremarkable.   Electronically Signed   By: Bretta Bang III M.D.   On: 02/17/2015 11:16   Dg Abd Portable 1v  02/13/2015   CLINICAL DATA:  Abdominal distension.  EXAM: PORTABLE ABDOMEN - 1 VIEW  COMPARISON:  February 12, 2015.  FINDINGS: Stable dilated small bowel loops are seen in the central and right side of the abdomen most consistent with ileus. No significant colonic dilatation is noted. No abnormal calcifications are noted.  IMPRESSION: Stable dilated small bowel loops are noted most consistent with ileus, but continued radiographic follow-up is recommended to rule out obstruction.   Electronically Signed   By: Lupita Raider, M.D.   On: 02/13/2015 12:07   Dg Abd Portable 1v  02/12/2015   CLINICAL DATA:  Ascites.  Ileus.  EXAM:  PORTABLE ABDOMEN - 1 VIEW  COMPARISON:  02/09/2015 .  02/08/2015.  FINDINGS: NG tube noted coiled in stomach. Dilated loops of small large bowel noted consistent with adynamic ileus. Follow-up abdominal series suggested to demonstrate clearing and to exclude bowel obstruction. No free air. Probable ascites. No acute bony abnormality.  IMPRESSION: 1.  NG tube noted in good anatomic position.  2. Distended loops of small and large bowel consistent with adynamic ileus. Follow-up abdominal series suggested to demonstrate clearing and to exclude bowel obstruction.  3.  Probable ascites.   Electronically Signed   By: Maisie Fus  Register   On: 02/12/2015 07:10    EKG:  8/12  Sinus tach low voltage no acute changes Telemetry:  NSR no arrhythmia 02/20/2015    ASSESSMENT AND PLAN:  Abnormal echo:  Do not see need for  TEE.  Findings seem more like artifact.  There is no AR/AS or abscess seen.  He has multiple comorbidities that make TEE higher risk including Respiratory compromise with recent extubation, varices with continues anemia, distended abdomen with ongoing ascites.  Source more likely from abdomen not SBE.  Will order limited f/u echo to focus on Aortic Valve since this echo was two weeks ago.  If still question can f/u echo in 6 weeks TTE but I suspect if tech focuses on aortic valve and changes DOF, harmonics and Frequency of probe we can identify any tree vegetation vs artifact.  Also note initial echo done while intubated with larger amount of ascites.  SignedCharlton Haws 02/20/2015, 9:35 AM

## 2015-02-21 ENCOUNTER — Inpatient Hospital Stay (HOSPITAL_COMMUNITY): Payer: Medicaid - Out of State

## 2015-02-21 DIAGNOSIS — K922 Gastrointestinal hemorrhage, unspecified: Secondary | ICD-10-CM

## 2015-02-21 LAB — CBC
HCT: 21.7 % — ABNORMAL LOW (ref 39.0–52.0)
Hemoglobin: 7.4 g/dL — ABNORMAL LOW (ref 13.0–17.0)
MCH: 28.6 pg (ref 26.0–34.0)
MCHC: 34.1 g/dL (ref 30.0–36.0)
MCV: 83.8 fL (ref 78.0–100.0)
PLATELETS: 78 10*3/uL — AB (ref 150–400)
RBC: 2.59 MIL/uL — ABNORMAL LOW (ref 4.22–5.81)
RDW: 16.1 % — ABNORMAL HIGH (ref 11.5–15.5)
WBC: 12 10*3/uL — ABNORMAL HIGH (ref 4.0–10.5)

## 2015-02-21 LAB — GLUCOSE, CAPILLARY
GLUCOSE-CAPILLARY: 122 mg/dL — AB (ref 65–99)
GLUCOSE-CAPILLARY: 124 mg/dL — AB (ref 65–99)
Glucose-Capillary: 94 mg/dL (ref 65–99)
Glucose-Capillary: 125 mg/dL — ABNORMAL HIGH (ref 65–99)
Glucose-Capillary: 106 mg/dL — ABNORMAL HIGH (ref 65–99)
Glucose-Capillary: 85 mg/dL (ref 65–99)
Glucose-Capillary: 88 mg/dL (ref 65–99)

## 2015-02-21 LAB — BASIC METABOLIC PANEL
ANION GAP: 5 (ref 5–15)
BUN: 5 mg/dL — ABNORMAL LOW (ref 6–20)
CHLORIDE: 109 mmol/L (ref 101–111)
CO2: 19 mmol/L — ABNORMAL LOW (ref 22–32)
Calcium: 7.3 mg/dL — ABNORMAL LOW (ref 8.9–10.3)
Creatinine, Ser: 0.97 mg/dL (ref 0.61–1.24)
Glucose, Bld: 117 mg/dL — ABNORMAL HIGH (ref 65–99)
POTASSIUM: 3.4 mmol/L — AB (ref 3.5–5.1)
SODIUM: 133 mmol/L — AB (ref 135–145)

## 2015-02-21 LAB — FIBRINOGEN

## 2015-02-21 LAB — VANCOMYCIN, RANDOM: Vancomycin Rm: 21 ug/mL

## 2015-02-21 LAB — HEPATIC FUNCTION PANEL
ALBUMIN: 1.7 g/dL — AB (ref 3.5–5.0)
ALK PHOS: 83 U/L (ref 38–126)
ALT: 28 U/L (ref 17–63)
AST: 90 U/L — AB (ref 15–41)
BILIRUBIN INDIRECT: 1.2 mg/dL — AB (ref 0.3–0.9)
Bilirubin, Direct: 1 mg/dL — ABNORMAL HIGH (ref 0.1–0.5)
TOTAL PROTEIN: 6.3 g/dL — AB (ref 6.5–8.1)
Total Bilirubin: 2.2 mg/dL — ABNORMAL HIGH (ref 0.3–1.2)

## 2015-02-21 LAB — APTT: aPTT: 61 seconds — ABNORMAL HIGH (ref 24–37)

## 2015-02-21 LAB — PROTIME-INR
INR: 3.37 — ABNORMAL HIGH (ref 0.00–1.49)
PROTHROMBIN TIME: 33.4 s — AB (ref 11.6–15.2)

## 2015-02-21 LAB — PROCALCITONIN: Procalcitonin: <0.1 ng/mL

## 2015-02-21 LAB — MAGNESIUM: MAGNESIUM: 1.5 mg/dL — AB (ref 1.7–2.4)

## 2015-02-21 LAB — PHOSPHORUS: Phosphorus: 2.1 mg/dL — ABNORMAL LOW (ref 2.5–4.6)

## 2015-02-21 MED ORDER — POTASSIUM CHLORIDE CRYS ER 20 MEQ PO TBCR
40.0000 meq | EXTENDED_RELEASE_TABLET | Freq: Once | ORAL | Status: AC
  Administered 2015-02-21: 40 meq via ORAL
  Filled 2015-02-21: qty 2

## 2015-02-21 MED ORDER — VANCOMYCIN HCL IN DEXTROSE 1-5 GM/200ML-% IV SOLN
1000.0000 mg | INTRAVENOUS | Status: DC
  Administered 2015-02-21 – 2015-02-26 (×6): 1000 mg via INTRAVENOUS
  Filled 2015-02-21 (×6): qty 200

## 2015-02-21 MED ORDER — MAGNESIUM SULFATE 4 GM/100ML IV SOLN
4.0000 g | Freq: Once | INTRAVENOUS | Status: AC
  Administered 2015-02-21: 4 g via INTRAVENOUS
  Filled 2015-02-21: qty 100

## 2015-02-21 MED ORDER — POTASSIUM PHOSPHATES 15 MMOLE/5ML IV SOLN
10.0000 mmol | Freq: Once | INTRAVENOUS | Status: AC
  Administered 2015-02-21: 10 mmol via INTRAVENOUS
  Filled 2015-02-21: qty 3.33

## 2015-02-21 NOTE — Progress Notes (Signed)
ANTIBIOTIC CONSULT NOTE - FOLLOW UP  Pharmacy Consult for Vancomycin Indication: HCAP  No Known Allergies  Patient Measurements: Height: 5\' 10"  (177.8 cm) Weight: 239 lb 3.2 oz (108.5 kg) IBW/kg (Calculated) : 73 Adjusted Body Weight:   Vital Signs: Temp: 98.3 F (36.8 C) (08/21 0446) Temp Source: Oral (08/21 0446) BP: 115/82 mmHg (08/21 0200) Intake/Output from previous day: 08/20 0701 - 08/21 0700 In: 500 [P.O.:360; I.V.:90; IV Piggyback:50] Out: -  Intake/Output from this shift: Total I/O In: 140 [I.V.:90; IV Piggyback:50] Out: -   Labs:  Recent Labs  02/18/15 0550 02/19/15 0630 02/20/15 0415 02/20/15 0538 02/20/15 1430 02/21/15 0428  WBC 14.7*  --  11.8*  --   --  12.0*  HGB 7.5*  --  6.8* 6.8* 7.8* 7.4*  PLT 62*  --  57*  --   --  78*  CREATININE 0.94 0.88 0.92  --   --   --    Estimated Creatinine Clearance: 117.2 mL/min (by C-G formula based on Cr of 0.92).  Recent Labs  02/20/15 2205  VANCOTROUGH 28*     Microbiology: Recent Results (from the past 720 hour(s))  Blood culture (routine x 2)     Status: None   Collection Time: 02/06/15 11:41 AM  Result Value Ref Range Status   Specimen Description BLOOD LEFT HAND  Final   Special Requests IN PEDIATRIC BOTTLE 2CC  Final   Culture   Final    NO GROWTH 5 DAYS Performed at [facility]    Report Status 02/11/2015 FINAL  Final  MRSA PCR Screening     Status: None   Collection Time: 02/06/15  1:00 PM  Result Value Ref Range Status   MRSA by PCR NEGATIVE NEGATIVE Final    Comment:        The GeneXpert MRSA Assay (FDA approved for NASAL specimens only), is one component of a comprehensive MRSA colonization surveillance program. It is not intended to diagnose MRSA infection nor to guide or monitor treatment for MRSA infections.   Blood culture (routine x 2)     Status: None   Collection Time: 02/06/15  6:40 PM  Result Value Ref Range Status   Specimen Description BLOOD LEFT HAND  6 ML  IN AEROBIC ONLY  Final   Special Requests NONE  Final   Culture   Final    NO GROWTH 5 DAYS Performed at [facility]    Report Status 02/12/2015 FINAL  Final  Culture, body fluid-bottle     Status: None   Collection Time: 02/07/15 12:46 PM  Result Value Ref Range Status   Specimen Description PERITONEAL  Final   Special Requests NONE  Final   Gram Stain   Final    GRAM POSITIVE COCCI IN CLUSTERS GRAM VARIABLE ROD ANAEROBIC BOTTLE ONLY CRITICAL RESULT CALLED TO, READ BACK BY AND VERIFIED WITH: R JOHNSON,RN AT 1446 02/08/15 BY L BENFIELD    Culture   Final    STAPHYLOCOCCUS SPECIES (COAGULASE NEGATIVE) THE SIGNIFICANCE OF ISOLATING THIS ORGANISM FROM A SINGLE SET OF BLOOD CULTURES WHEN MULTIPLE SETS ARE DRAWN IS UNCERTAIN. PLEASE NOTIFY THE MICROBIOLOGY DEPARTMENT WITHIN ONE WEEK IF SPECIATION AND  SENSITIVITIES ARE REQUIRED. BACILLUS SPECIES Standardized susceptibility testing for this organism is not available. Performed at [facility]    Report Status FINAL FINAL  Final  Gram stain     Status: None   Collection Time: 02/07/15 12:46 PM  Result Value Ref Range Status  Specimen Description PERITONEAL  Final   Special Requests NONE  Final   Gram Stain   Final    CYTOSPIN SLIDE WBC PRESENT,BOTH PMN AND MONONUCLEAR NO ORGANISMS SEEN Performed at [facility]    Report Status 02/07/2015 FINAL  Final  Cath Tip Culture     Status: None   Collection Time: 02/10/15 10:44 PM  Result Value Ref Range Status   Specimen Description CATH TIP  Final   Special Requests NONE  Final   Culture   Final    NO GROWTH 2 DAYS Performed at [facility]    Report Status 02/13/2015 FINAL  Final  Culture, blood (routine x 2)     Status: None (Preliminary result)   Collection Time: 02/19/15  1:25 PM  Result Value Ref Range Status   Specimen Description BLOOD LEFT ARM  Final   Special Requests IN PEDIATRIC BOTTLE 1CC  Final   Culture  Setup Time   Final    GRAM  POSITIVE COCCI IN CLUSTERS AEROBIC BOTTLE ONLY CRITICAL RESULT CALLED TO, READ BACK BY AND VERIFIED WITH: C. LITTLE,RN AT 1610 ON [id number] BY Lucienne Capers    Culture   Final    Romie Minus POSITIVE COCCI Performed at [facility]    Report Status PENDING  Incomplete  Culture, blood (routine x 2)     Status: None (Preliminary result)   Collection Time: 02/19/15  1:29 PM  Result Value Ref Range Status   Specimen Description BLOOD LEFT HAND  Final   Special Requests BOTTLES DRAWN AEROBIC ONLY 7CC  Final   Culture   Final    NO GROWTH < 24 HOURS Performed at [facility]    Report Status PENDING  Incomplete    Anti-infectives    Start     Dose/Rate Route Frequency Ordered Stop   02/20/15 1000  rifaximin (XIFAXAN) tablet 550 mg     550 mg Oral 2 times daily 02/20/15 0907     02/18/15 2300  vancomycin (VANCOCIN) IVPB 1000 mg/200 mL premix  Status:  Discontinued     1,000 mg 200 mL/hr over 60 Minutes Intravenous Every 12 hours 02/18/15 1101 02/20/15 2304   02/18/15 1130  vancomycin (VANCOCIN) 2,000 mg in sodium chloride 0.9 % 500 mL IVPB     2,000 mg 250 mL/hr over 120 Minutes Intravenous  Once 02/18/15 1101 02/18/15 1546   02/18/15 1130  piperacillin-tazobactam (ZOSYN) IVPB 3.375 g     3.375 g 12.5 mL/hr over 240 Minutes Intravenous Every 8 hours 02/18/15 1101     02/10/15 1000  cefTRIAXone (ROCEPHIN) 2 g in dextrose 5 % 50 mL IVPB  Status:  Discontinued     2 g 100 mL/hr over 30 Minutes Intravenous Every 24 hours 02/10/15 0810 02/18/15 1049   02/10/15 1000  vancomycin (VANCOCIN) IVPB 750 mg/150 ml premix  Status:  Discontinued     750 mg 150 mL/hr over 60 Minutes Intravenous Every 12 hours 02/10/15 0833 02/11/15 1124   02/09/15 1600  vancomycin (VANCOCIN) 1,250 mg in sodium chloride 0.9 % 250 mL IVPB  Status:  Discontinued     1,250 mg 166.7 mL/hr over 90 Minutes Intravenous Every 24 hours 02/08/15 1533 02/10/15 0833   02/09/15 1200  rifaximin (XIFAXAN) tablet 550 mg   Status:  Discontinued     550 mg Per Tube 2 times daily 02/09/15 1104 02/10/15 0856   02/08/15 1600  vancomycin (VANCOCIN) 2,000 mg in sodium chloride 0.9 % 500 mL IVPB  2,000 mg 250 mL/hr over 120 Minutes Intravenous NOW 02/08/15 1533 02/08/15 1816   02/06/15 1145  cefTRIAXone (ROCEPHIN) 1 g in dextrose 5 % 50 mL IVPB  Status:  Discontinued     1 g 100 mL/hr over 30 Minutes Intravenous Every 24 hours 02/06/15 1142 02/10/15 0810      Assessment: Patient with high vancomycin level.  Prior doses charted correctly.    Goal of Therapy:  Vancomycin trough level 15-20 mcg/ml  Plan:  Measure antibiotic drug levels at steady state Follow up culture results d/c vancomycin for now, recheck level at 0600 8/21  Darlina Guys, [place] 02/21/2015,4:50 AM

## 2015-02-21 NOTE — Progress Notes (Signed)
CRITICAL VALUE ALERT  Critical value received:  Vancomycin Tr = 28  Date of notification:  02/20/15  Time of notification:  2300  Critical value read back:Yes.    Nurse who received alert:  Reuel Boom RN  MD notified (1st page):  Pharmacist  Time of first page:  2300  MD notified (2nd page):  Time of second page:  Responding MD:  Pharmacist Laurena Slimmer   Time MD responded:  2303, asked to hold next dose

## 2015-02-21 NOTE — Progress Notes (Signed)
Pt refused Lactulose.

## 2015-02-21 NOTE — Progress Notes (Signed)
Patient ID: Francisco [name], male   DOB: 08/01/1965, 49 y.o.   MRN: [id number] Reviewed limited echo done yesterday Aortic valve is normal with no vegetation. No AS/AR.  Previous echo was artifact No indication for TEE  [employer]

## 2015-02-21 NOTE — Progress Notes (Signed)
eLink Physician-Brief Progress Note Patient Name: Francisco [name] DOB: 1965/10/06 MRN: [id number]   Date of Service  02/21/2015  HPI/Events of Note   Recent Labs Lab 02/17/15 0520 02/18/15 0550 02/19/15 0630 02/20/15 0415 02/21/15 0428  K 3.1* 3.5 3.3* 2.9* 3.4*    Recent Labs Lab 02/17/15 0520 02/18/15 0550 02/19/15 0630 02/20/15 0415 02/21/15 0428  NA 142 136 135 134* 133*  K 3.1* 3.5 3.3* 2.9* 3.4*  CL 116* 111 110 109 109  CO2 21* 19* 20* 20* 19*  GLUCOSE 90 94 96 84 117*  BUN 6 5* 5* <5* 5*  CREATININE 1.00 0.94 0.88 0.92 0.97  CALCIUM 7.5* 7.4* 7.4* 7.3* 7.3*  MG 1.8 1.7 1.7 1.5* 1.5*  PHOS 2.7 2.4* 2.4* 2.7 2.1*     Recent Labs Lab 02/17/15 0520 02/18/15 0550 02/19/15 0630 02/20/15 0415 02/21/15 0428  CREATININE 1.00 0.94 0.88 0.92 0.97   Hypokalemia Hypomagnesemia Hypophosphatemia   Also RN says fibrinogen low  eICU Interventions  Replete K, mag, phos     Intervention Category Intermediate Interventions: Other:  Shaquila Sigman 02/21/2015, 5:29 AM

## 2015-02-21 NOTE — Progress Notes (Signed)
ANTIBIOTIC CONSULT NOTE - Follow Up  Pharmacy Consult for vancomycin and zosyn Indication: HCAP  No Known Allergies  Patient Measurements: Height:  (177.8 cm) Weight: 239 lb 3.2 oz (108.5 kg) IBW/kg (Calculated) : 73  Vital Signs: Temp: 98.3 F (36.8 C) (08/21 0446) Temp Source: Oral (08/21 0446) BP: 107/75 mmHg (08/21 0800) Intake/Output from previous day: 08/20 0701 - 08/21 0700 In: 880 [P.O.:360; I.V.:120; IV Piggyback:400] Out: -  Intake/Output from this shift: Total I/O In: 80 [I.V.:30; IV Piggyback:50] Out: -   Labs:  Recent Labs  02/19/15 0630  02/20/15 0415 02/20/15 0538 02/20/15 1430 02/21/15 0428  WBC  --   --  11.8*  --   --  12.0*  HGB  --   < > 6.8* 6.8* 7.8* 7.4*  PLT  --   --  57*  --   --  78*  CREATININE 0.88  --  0.92  --   --  0.97  < > = values in this interval not displayed. Estimated Creatinine Clearance: 111.1 mL/min (by C-G formula based on Cr of 0.97).  Recent Labs  02/20/15 2205 02/21/15 0600  VANCOTROUGH 28*  --   VANCORANDOM  --  21     Microbiology: Recent Results (from the past 720 hour(s))  Blood culture (routine x 2)     Status: None   Collection Time: 02/06/15 11:41 AM  Result Value Ref Range Status   Specimen Description BLOOD LEFT HAND  Final   Special Requests IN PEDIATRIC BOTTLE 2CC  Final   Culture   Final    NO GROWTH 5 DAYS Performed at [facility]    Report Status 02/11/2015 FINAL  Final  MRSA PCR Screening     Status: None   Collection Time: 02/06/15  1:00 PM  Result Value Ref Range Status   MRSA by PCR NEGATIVE NEGATIVE Final    Comment:        The GeneXpert MRSA Assay (FDA approved for NASAL specimens only), is one component of a comprehensive MRSA colonization surveillance program. It is not intended to diagnose MRSA infection nor to guide or monitor treatment for MRSA infections.   Blood culture (routine x 2)     Status: None   Collection Time: 02/06/15  6:40 PM  Result Value Ref  Range Status   Specimen Description BLOOD LEFT HAND  6 ML IN AEROBIC ONLY  Final   Special Requests NONE  Final   Culture   Final    NO GROWTH 5 DAYS Performed at [facility]    Report Status 02/12/2015 FINAL  Final  Culture, body fluid-bottle     Status: None   Collection Time: 02/07/15 12:46 PM  Result Value Ref Range Status   Specimen Description PERITONEAL  Final   Special Requests NONE  Final   Gram Stain   Final    GRAM POSITIVE COCCI IN CLUSTERS GRAM VARIABLE ROD ANAEROBIC BOTTLE ONLY CRITICAL RESULT CALLED TO, READ BACK BY AND VERIFIED WITH: R JOHNSON,RN AT 1446 02/08/15 BY L BENFIELD    Culture   Final    STAPHYLOCOCCUS SPECIES (COAGULASE NEGATIVE) THE SIGNIFICANCE OF ISOLATING THIS ORGANISM FROM A SINGLE SET OF BLOOD CULTURES WHEN MULTIPLE SETS ARE DRAWN IS UNCERTAIN. PLEASE NOTIFY THE MICROBIOLOGY DEPARTMENT WITHIN ONE WEEK IF SPECIATION AND  SENSITIVITIES ARE REQUIRED. BACILLUS SPECIES Standardized susceptibility testing for this organism is not available. Performed at [facility]    Report Status FINAL FINAL  Final  Gram  stain     Status: None   Collection Time: 02/07/15 12:46 PM  Result Value Ref Range Status   Specimen Description PERITONEAL  Final   Special Requests NONE  Final   Gram Stain   Final    CYTOSPIN SLIDE WBC PRESENT,BOTH PMN AND MONONUCLEAR NO ORGANISMS SEEN Performed at [facility]    Report Status 02/07/2015 FINAL  Final  Cath Tip Culture     Status: None   Collection Time: 02/10/15 10:44 PM  Result Value Ref Range Status   Specimen Description CATH TIP  Final   Special Requests NONE  Final   Culture   Final    NO GROWTH 2 DAYS Performed at [facility]    Report Status 02/13/2015 FINAL  Final  Culture, blood (routine x 2)     Status: None (Preliminary result)   Collection Time: 02/19/15  1:25 PM  Result Value Ref Range Status   Specimen Description BLOOD LEFT ARM  Final   Special Requests IN PEDIATRIC  BOTTLE 1CC  Final   Culture  Setup Time   Final    GRAM POSITIVE COCCI IN CLUSTERS AEROBIC BOTTLE ONLY CRITICAL RESULT CALLED TO, READ BACK BY AND VERIFIED WITH: C. LITTLE,RN AT 0819 ON [id number] BY Lucienne Capers    Culture   Final    STAPHYLOCOCCUS SPECIES (COAGULASE NEGATIVE) THE SIGNIFICANCE OF ISOLATING THIS ORGANISM FROM A SINGLE SET OF BLOOD CULTURES WHEN MULTIPLE SETS ARE DRAWN IS UNCERTAIN. PLEASE NOTIFY THE MICROBIOLOGY DEPARTMENT WITHIN ONE WEEK IF SPECIATION AND SENSITIVITIES ARE REQUIRED. Performed at [facility]    Report Status PENDING  Incomplete  Culture, blood (routine x 2)     Status: None (Preliminary result)   Collection Time: 02/19/15  1:29 PM  Result Value Ref Range Status   Specimen Description BLOOD LEFT HAND  Final   Special Requests BOTTLES DRAWN AEROBIC ONLY 7CC  Final   Culture   Final    NO GROWTH < 24 HOURS Performed at [facility]    Report Status PENDING  Incomplete    Medical History: Past Medical History  Diagnosis Date  . Hepatic cirrhosis   . Esophageal varices     last banding in [place] June 2016  . Chronic renal failure     was on hemodialysis in June 2016  . Congestive heart failure     questionable hx & on lasix  . Ascites     Assessment: Patient's a 49 y.o M with cirrhosis, ascites, and variceal bleeding (s/p banding) currently on ceftriaxone for SBP prophylaxis. To change abx to vancomycin and zosyn today for PNA.  Today, 02/21/2015: - Temp: afebrile - WBC: stable at 12 - Renal: AKI -resolved - Vanc Random 21 with last dose 1g 8/20 at 1001   8/6 >> ceftriaxone >> (8/10 dose inc to 2gm)>>8/18 8/8 >> vancomycin >>  8/11>> resume 8/18 >> 8/18 >> zosyn>>  8/18 CXR: alveolar opacity in the right upper lobe consistent with pneumonia  8/6 bloodx2: neg FINAL 8/7 peritoneal fluid: CNS FINAL 8/10 cath tip: neg FINAL 8/19 blood x 2: GPC 1 of 2 - CNS   Goal of Therapy:  Vancomycin trough level 15-20 mcg/ml  Plan:  D4 Antibiotics for HCAP - continue zosyn 3.375 gm IV q8h (infuse over 4 hours) - Vanc level just above goal of 15-20 and level was drawn ~20 hrs after last dose - Restart vanc at dose 1000mg  IV q24   Hessie Knows, PharmD, BCPS  Pager [phone] 02/21/2015 10:15 AM

## 2015-02-21 NOTE — Progress Notes (Signed)
CRITICAL VALUE ALERT  Critical value received:  Fibrinogen  Date of notification:  02/21/15  Time of notification:  0520  Critical value read back:Yes.    Nurse who received alert:  Reuel Boom RN  MD notified (1st page):  E-Link  Time of first page:  0528  MD notified (2nd page):  Time of second page:  Responding MD:  Dr. Marchelle Gearing  Time MD responded:  [phone]

## 2015-02-21 NOTE — Progress Notes (Addendum)
PULMONARY / CRITICAL CARE MEDICINE   Name: Francisco [name] MRN: [id number] DOB: 02-18-1966    ADMISSION DATE:  02/06/2015 CONSULTATION DATE:  02/06/15  REFERRING MD :  ED  CHIEF COMPLAINT:  GI Bleed & CHF  INITIAL PRESENTATION: Patient admitted with worsening dyspnea and lower extremity edema. Recently discharged from SNF after long hospitalization at OSH in [place] with GIB from esophageal varices post banding.  STUDIES:  Portable CXR 8/6 - Peribronchial cuffing. No consolidation or effusion. TTE 8/7 - EF 55-60%. No wall motion abnormality. Grade 1 diastolic dysfunction. Hazy density on AV.  SIGNIFICANT EVENTS: 8/06  Admit to ICU 8/06  Transfuse 1u PRBCs 8/06  Elective intubation & Left IJ placed 8/06  EGD w/o varices but w/ small peptic ulcers 8/07  paracentesis 3.8 liters.  8/11  slowly increasing vasopressor requirement 8/12  Pink tinged output during lactulose enema overnight. On Fentanyl gtt for agitation, now patient comatose & nonresponsive now. Received 1 dose of Ativan in last 24 hours. Continues to require vasopressor support. 8/12  Paracentesis  8/13  Unresponsive most of the time and mostly breathes with vent per RN. S/p Paracentesis 2L yesterday and now with significant leak at puncture site - requiring ostomy bag to collect drainage. Not actively bleeing. Not on sedation. On neo with MAP> 65 and sbp 86. Ongoing adynamc ileus +  8/14  More responsive, 1L ascites leak overnight through bag, remains on neo 8/16  neo weaned off.  Pt on PSV 5/5, 30% and tolerating well.  Mouthing for ETT to be removed. RT reports ETT has moved in/out since yesterday due to holder.  750 ml out of para site in last 12 hours 8/18 extubated stable, nad  SUBJECTIVE:  No distress. Asking for feeds to be advanced.  VITAL SIGNS: Temp:  [98.2 F (36.8 C)-98.8 F (37.1 C)] 98.3 F (36.8 C) (08/21 0446) Resp:  [17-28] 17 (08/21 0800) BP: (100-119)/(69-91) 107/75 mmHg (08/21 0800) SpO2:  [99  %-100 %] 99 % (08/21 0800) Weight:  [239 lb 3.2 oz (108.5 kg)] 239 lb 3.2 oz (108.5 kg) (08/21 0446)    INTAKE / OUTPUT:  Intake/Output Summary (Last 24 hours) at 02/21/15 1034 Last data filed at 02/21/15 0900  Gross per 24 hour  Intake    960 ml  Output      0 ml  Net    960 ml    PHYSICAL EXAMINATION: General: chronically ill appearing adult male in NAD  Neuro:  Awake, alert, MAE/generalized weakness, speech clear, increased confusion HEENT:  Tacky MM. Cardiovascular:  s1s2 rrr, no m/r/g, palpable pulses Lungs:  Even/non-labored on RA, lungs bilaterally clear anterior, diminished lateral/bases Abdomen:  Protuberant. Bowel sounds +, Ostomy bag RLQ Musculoskeletal:  No joint deformity or effusion.  Skin:  Warm & dry. No rash. No cyanosis.  LABS:   PULMONARY No results for input(s): PHART, PCO2ART, PO2ART, HCO3, TCO2, O2SAT in the last 168 hours.  Invalid input(s): PCO2, PO2  CBC  Recent Labs Lab 02/18/15 0550 02/20/15 0415 02/20/15 0538 02/20/15 1430 02/21/15 0428  HGB 7.5* 6.8* 6.8* 7.8* 7.4*  HCT 23.2* 20.7* 20.6* 22.8* 21.7*  WBC 14.7* 11.8*  --   --  12.0*  PLT 62* 57*  --   --  78*   COAGULATION  Recent Labs Lab 02/17/15 0520 02/18/15 0550 02/19/15 0630 02/20/15 0415 02/21/15 0428  INR 3.39* 3.63* 4.01* 3.86* 3.37*   CARDIAC No results for input(s): TROPONINI in the last 168 hours. No results for input(s):  PROBNP in the last 168 hours.  CHEMISTRY  Recent Labs Lab 02/17/15 0520 02/18/15 0550 02/19/15 0630 02/20/15 0415 02/21/15 0428  NA 142 136 135 134* 133*  K 3.1* 3.5 3.3* 2.9* 3.4*  CL 116* 111 110 109 109  CO2 21* 19* 20* 20* 19*  GLUCOSE 90 94 96 84 117*  BUN 6 5* 5* <5* 5*  CREATININE 1.00 0.94 0.88 0.92 0.97  CALCIUM 7.5* 7.4* 7.4* 7.3* 7.3*  MG 1.8 1.7 1.7 1.5* 1.5*  PHOS 2.7 2.4* 2.4* 2.7 2.1*   Estimated Creatinine Clearance: 111.1 mL/min (by C-G formula based on Cr of 0.97).  LIVER  Recent Labs Lab 02/15/15 0530  02/16/15 0425 02/17/15 0520 02/18/15 0550 02/19/15 0630 02/20/15 0415 02/21/15 0428  AST 45* 57* 61*  --   --   --  90*  ALT 14* 17 18  --   --   --  28  ALKPHOS 57 62 56  --   --   --  83  BILITOT 2.2* 2.4* 2.1*  --   --   --  2.2*  PROT 5.6* 6.0* 5.9*  --   --   --  6.3*  ALBUMIN 1.7* 1.8* 1.8*  --   --   --  1.7*  INR 2.44* 2.80* 3.39* 3.63* 4.01* 3.86* 3.37*   INFECTIOUS  Recent Labs Lab 02/19/15 1329 02/20/15 0415 02/21/15 0428  PROCALCITON 0.11 0.10 <0.10   ENDOCRINE CBG (last 3)   Recent Labs  02/20/15 1947 02/21/15 02/21/15 0429  GLUCAP 106* 124* 125*    IMAGING x48h  - image(s) personally visualized  -   highlighted in bold Dg Abd 1 View  02/19/2015   CLINICAL DATA:  Abdominal distension  EXAM: ABDOMEN - 1 VIEW  COMPARISON:  02/17/2015  FINDINGS: Mild small bowel dilatation is noted which is increased in the interval from the prior exam. Central crowding of the bowel markings is noted likely related to recurrent ascites. Some changes of anasarca are seen. No acute bony abnormality is noted.  IMPRESSION: Likely recurrent ascites.  New small bowel dilatation likely representing a partial small bowel obstruction. Correlation with the physical exam is recommended.   Electronically Signed   By: Alcide Clever M.D.   On: 02/19/2015 12:55   Dg Chest Port 1 View  02/20/2015   CLINICAL DATA:  Congestive failure  EXAM: PORTABLE CHEST - 1 VIEW  COMPARISON:  02/19/2015  FINDINGS: Cardiac shadow is stable. Right-sided PICC line is again identified and stable. Patchy opacities are again identified throughout both lungs. No sizable effusion or pneumothorax is noted.  IMPRESSION: Overall stable appearance of the chest when given some technical variations.   Electronically Signed   By: Alcide Clever M.D.   On: 02/20/2015 07:15    ASSESSMENT / PLAN:  PULMONARY OETT 8/6 >> A: Acute Respiratory Failure - initially intubated for upper endoscopy electively Extubated 8/18 P:   Oxygen  for saturations > 92% Currently stable on room air. Intermittent CXR  Pulmonary hygiene: IS, mobilize  CARDIOVASCULAR L IJ CVC 8/6 >> 8/10 PICC 8/10 >> A:  Grade I Diastolic Dysfunction  Questionable AV Vegitation  Hypotension - Secondary to sedation vs Septic Shock Bradycardia - Secondary to Precedex Prolonged QTc P:  Cardiology consulted regarding need for TEE based on TTE that showed a hazy density on AV. No need for TEE urgently per their review of echo. Finding likely artefact. F/U echo in 4 weeks.    RENAL Lab Results  Component  Value Date   CREATININE 0.97 02/21/2015   CREATININE 0.92 02/20/2015   CREATININE 0.88 02/19/2015    A:   Acute on Chronic CKD - Unknown baseline creatinine. Previously on HD. UOP fluctuates.    Lactic Acidosis - Resolved Hypokalemia  Hypomagnesemia  P:   Monitor UOP / BMP  Replace electrolytes as indicated  KCL 8/17   GASTROINTESTINAL A:   GIB - Secondary to peptic ulcers, had been taking Aleve prior to admit. H/O Variceal Bleed - s/p OSH banding June 2016. 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  P:   Lactulose & xifaxan. Protonix IV bid Bedside US with increased ascitic volume. Will not tap now as there is no evidence of resp compromise.   Repeat Abd x ray to evaluate Ileus. Keep NPO. Pt refusing to get NG tube for suction.   HEMATOLOGIC  Recent Labs  02/20/15 1430 02/21/15 0428  HGB 7.8* 7.4*    A:   Anemia - secondary to GIB, table,no signs of active bleeding. Coagulopathy - Secondary to cirrhosis Thrombocytopenia - in setting of cirrhosis  P:  Transfuse for Hgb <7.0 or active bleeding SCDs for DVT prophylaxis No chemical DVT prophylaxis given GIB Trend platelets   INFECTIOUS A:   SBP - Bacillus species & Coag Neg Staph Possible Aortic Valve Vegetation  Now with 1/2 bottles with  coag negative staph. May be contaminant Pt covered with antibiotics.  P:   Monitor fever curve / WBC  BCx2 8/19 >> neg Ascites 8/8>>>Bacillus species & Coag Neg Staph Cath tip culture 8/10 >> neg BCx2 8/19>> 1/2 bottles Coag neg staph  Rocephin, start date 8/6, day >>8/18 Vancomycin, 8/8 >> 8/11 restart 8/18 Pip-tazo 8/18>>  ENDOCRINE CBG (last 3)   Recent Labs  02/20/15 1947 02/21/15 02/21/15 0429  GLUCAP 106* 124* 125*    A:   No acute issues. P:   Accuchecks q4hr with hepatic disease MD to be notified for BG <90 or >160 D5LR @ [facility]  NEUROLOGIC A:   Acute encephalopathy - Hepatic vs medication induced, previously on fentanyl gtt.  Off sedation > 48 hours on 8/15.  CT head negative. Resolved 8/18, returned 8/19 P:   Continue lactulose / xifaxin  RASS goal: 0  Continue Thiamine IV daily  FAMILY  - Updates: Patient updated at bedside 8/21 on plan of care    Global: 8/21 will change to tele status, abx broadened 8/18, Coag neg staph bacteremia- May be contaminnant.  Pt is not critically ill  Chilton Greathouse MD [facility] Pager [phone] If no answer or after 3pm call: [phone] 02/21/2015, 10:34 AM

## 2015-02-21 NOTE — Progress Notes (Signed)
Rn explained to patient about X-ray showings of possible SBO and needing to become NPO and receive a NG tube to low wall suction. Patient Refused NG tube and stated to RN, "Get the f**k out of my room. I aint tryna hear that. Yall don't let me do s**t around here. I just am gonna f**king leave. You ain't puttin sh*t in me." Nurse tried to educate patient on the importance of having an NG tube placed and becoming NPO due to condition including possible negative outcomes if procedures not done including length in stay. Patient again told RN to get the f**k out of his room. RN left room. RN informed MD about patients decision to not get NG tube placed, wanting to leave AMA and wanting to eat despite having SBO. Will continue to monitor.

## 2015-02-22 DIAGNOSIS — R1900 Intra-abdominal and pelvic swelling, mass and lump, unspecified site: Secondary | ICD-10-CM

## 2015-02-22 DIAGNOSIS — R198 Other specified symptoms and signs involving the digestive system and abdomen: Secondary | ICD-10-CM

## 2015-02-22 LAB — GLUCOSE, CAPILLARY
GLUCOSE-CAPILLARY: 77 mg/dL (ref 65–99)
GLUCOSE-CAPILLARY: 79 mg/dL (ref 65–99)
GLUCOSE-CAPILLARY: 81 mg/dL (ref 65–99)
Glucose-Capillary: 100 mg/dL — ABNORMAL HIGH (ref 65–99)
Glucose-Capillary: 108 mg/dL — ABNORMAL HIGH (ref 65–99)
Glucose-Capillary: 98 mg/dL (ref 65–99)

## 2015-02-22 LAB — CULTURE, BLOOD (ROUTINE X 2)

## 2015-02-22 LAB — PROTIME-INR
INR: 3.29 — ABNORMAL HIGH (ref 0.00–1.49)
Prothrombin Time: 32.8 seconds — ABNORMAL HIGH (ref 11.6–15.2)

## 2015-02-22 LAB — MAGNESIUM: Magnesium: 1.8 mg/dL (ref 1.7–2.4)

## 2015-02-22 LAB — FIBRINOGEN: Fibrinogen: <60 mg/dL — CL (ref 204–475)

## 2015-02-22 LAB — APTT: APTT: 63 s — AB (ref 24–37)

## 2015-02-22 LAB — PHOSPHORUS: PHOSPHORUS: 3 mg/dL (ref 2.5–4.6)

## 2015-02-22 MED ORDER — ALPRAZOLAM 0.25 MG PO TABS
0.2500 mg | ORAL_TABLET | Freq: Once | ORAL | Status: AC
  Administered 2015-02-22: 0.25 mg via ORAL
  Filled 2015-02-22: qty 1

## 2015-02-22 NOTE — Care Management Note (Signed)
Case Management Note  Patient Details  Name: Zimere Dunlevy MRN: [id number] Date of Birth: December 17, 1965  Subjective/Objective:  Transfer from SDU. GIB, CHF.From home.                  Action/Plan:d/c plan home.   Expected Discharge Date:   (unknown)               Expected Discharge Plan:  Home/Self Care  In-House Referral:  Clinical Social Work  Discharge planning Services  CM Consult  Post Acute Care Choice:  NA Choice offered to:  NA  DME Arranged:    DME Agency:     HH Arranged:    HH Agency:     Status of Service:  In process, will continue to follow  Medicare Important Message Given:    Date Medicare IM Given:    Medicare IM give by:    Date Additional Medicare IM Given:    Additional Medicare Important Message give by:     If discussed at Long Length of Stay Meetings, dates discussed:    Additional Comments:  Lanier Clam, RN 02/22/2015, 2:41 PM

## 2015-02-22 NOTE — Progress Notes (Signed)
Patient requesting NG-tube removal Dr. Izola Price notified assessed patient and stated to remove tube.

## 2015-02-22 NOTE — Progress Notes (Signed)
CRITICAL VALUE ALERT  Critical value received:  6:06 AM   Date of notification:  02/22/2015   Time of notification:  6:06 AM   Critical value read back:Yes.    Nurse who received alert:  Verdene Rio, RN  MD notified (1st page):  N/A  Time of first page:  N/A  MD notified (2nd page): N/A  Time of second page: N/A  Responding MD:  N/A  Time MD responded:  N/A

## 2015-02-22 NOTE — Progress Notes (Signed)
   02/22/15 1100  What Happened  Was fall witnessed? No  Was patient injured? No  Patient found on floor  Found by Staff-comment  Stated prior activity to/from bed, chair, or stretcher  Follow Up  MD notified Dr Izola Price  Time MD notified [id number]  Family notified No- patient refusal (pt stated he would call his sister)  Additional tests No  Simple treatment Dressing  Adult Fall Risk Assessment  Risk Factor Category (scoring not indicated) Fall has occurred during this admission (document High fall risk);High fall risk per protocol (document High fall risk)  Patient's Fall Risk High Fall Risk (>13 points)  Adult Fall Risk Interventions  Required Bundle Interventions *See Row Information* High fall risk - low, moderate, and high requirements implemented  Additional Interventions Secure all tubes/drains;Fall risk signage;Individualized elimination schedule  Fall with Injury Screening  Risk For Fall Injury- See Row Information  F  Intervention(s) for 2 or more risk criteria identified Gait Belt  Vitals  Temp 97.8 F (36.6 C)  Temp Source Oral  BP 103/76 mmHg  BP Location Left Arm  BP Method Automatic  Patient Position (if appropriate) Lying  Pulse Rate 86  Resp 20  Oxygen Therapy  SpO2 100 %  O2 Device Room Air  Pain Assessment  Pain Assessment 0-10  PCA/Epidural/Spinal Assessment  Respiratory Pattern Regular;Unlabored  Neurological  Neuro (WDL) WDL  Musculoskeletal  Musculoskeletal (WDL) X  Assistive Device MaxiMove  Generalized Weakness Yes  Weight Bearing Restrictions No  Integumentary  Integumentary (WDL) X  Skin Color Appropriate for ethnicity  Skin Condition Dry  Skin Integrity MSAD  Moisture Associated Skin Damage Location Perineum  Moisture Associated Skin Damage Orientation Posterior  Skin Tear Location Buttocks  Skin Tear Location Orientation Left  Skin Tear Intervention Foam;Cleansed  Skin Turgor Non-tenting

## 2015-02-22 NOTE — Progress Notes (Signed)
Patient ID: Francisco [name], male   DOB: [birth year], 49 y.o.   MRN: [id number]  [facility] PROGRESS NOTE  Nyquan Symonds WUJ:[id number] DOB: 03/04/1966 DOA: 02/06/2015 PCP: Pcp Not In System   Brief narrative:    Patient admitted with worsening dyspnea and lower extremity edema. Recently discharged from SNF after long hospitalization at OSH in [place] with GIB from esophageal varices post banding.  STUDIES:  Portable CXR 8/6 - Peribronchial cuffing. No consolidation or effusion. TTE 8/7 - EF 55-60%. No wall motion abnormality. Grade 1 diastolic dysfunction. Hazy density on AV.  SIGNIFICANT EVENTS: 8/06 Admit to ICU 8/06 Transfuse 1u PRBCs 8/06 Elective intubation & Left IJ placed 8/06 EGD w/o varices but w/ small peptic ulcers 8/07 paracentesis 3.8 liters.  8/11 slowly increasing vasopressor requirement 8/12 Pink tinged output during lactulose enema overnight. On Fentanyl gtt for agitation, now patient comatose & nonresponsive now. Received 1 dose of Ativan in last 24 hours. Continues to require vasopressor support. 8/12 Paracentesis  8/13 Unresponsive most of the time and mostly breathes with vent per RN. S/p Paracentesis 2L yesterday and now with significant leak at puncture site - requiring ostomy bag to collect drainage. Not actively bleeing. Not on sedation. On neo with MAP> 65 and sbp 86. Ongoing adynamc ileus +  8/14 More responsive, 1L ascites leak overnight through bag, remains on neo 8/16 neo weaned off. Pt on PSV 5/5, 30% and tolerating well. Mouthing for ETT to be removed. RT reports ETT has moved in/out since yesterday due to holder. 750 ml out of para site in last 12 hours 8/18 extubated stable 8/22 Transferred to [facility] for continuation of care   Assessment/Plan:    Acute respiratory failure with hypoxia, VDRF - secondary to septic shock, acute on chronic diastolic CHF  - OETT 8/6 >> extubated 8/18 - Oxygen for saturations > 92% - Currently  stable on room air. - Intermittent CXR as indicated  - Pulmonary hygiene: IS, mobilize  Hypotension - secondary to sedation, vs septic shock from / SBP (Bacillus sp, ? 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 - No need for TEE urgently per their review of echo. Finding likely artefact. F/U echo in 4 weeks.   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 Previously on HD. UOP fluctuates. - Cr has been WNL - monitor urine output    Lactic Acidosis  - Resolved  Hypokalemia  - with underlying hpomagnesemia  - continue to supplement and repeat BMP in AM  Acute blood loss anemia due to GIB  - secondary to peptic ulcers (pt has been on Aleve prior to admit) - also has hx of variceal bleed, s/p OSH banding June 2016. 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 May be contaminant - Pt covered with antibiotics, see below   BCx2 8/19 >> neg Ascites 8/8>>>Bacillus species & Coag Neg Staph Cath tip culture 8/10 >> neg BCx2 8/19>> 1/2 bottles Coag neg staph  Rocephin, start date 8/6, day >>8/18 Vancomycin, 8/8 >> 8/11 restart  8/18 Pip-tazo 8/18>>  Acute encephalopathy  - Hepatic vs medication induced, previously on fentanyl gtt.  - Off sedation > 48 hours on 8/15. CT head negative. Resolved 8/18, returned 8/19  - Continue lactulose / xifaxin  - RASS goal: 0  - Continue Thiamine IV daily  DVT prophylaxis - SCD's  Code Status: Full.  Family Communication:  plan of care discussed with the patient Disposition Plan: Home when stable.   IV access:  Peripheral IV  Procedures and diagnostic studies:    Dg Abd 1 View  02/21/2015   CLINICAL DATA:  Abdominal distention. On ventilator. Hepatic cirrhosis chronic renal failure.  EXAM: ABDOMEN - 1 VIEW  COMPARISON:  02/19/2015  FINDINGS: Multiple moderately dilated small bowel loops show no significant change. There is a relative paucity of colonic gas. Distal small bowel obstruction cannot be excluded.  IMPRESSION: No significant change in moderately dilated small bowel loops. Distal small bowel obstruction cannot be excluded.   Electronically Signed   By: Myles Rosenthal M.D.   On: 02/21/2015 11:56   Dg Abd 1 View  02/19/2015   CLINICAL DATA:  Abdominal distension  EXAM: ABDOMEN - 1 VIEW  COMPARISON:  02/17/2015  FINDINGS: Mild small bowel dilatation is noted which is increased in the interval from the prior exam. Central crowding of the bowel markings is noted likely related to recurrent ascites. Some changes of anasarca are seen. No acute bony abnormality is noted.  IMPRESSION: Likely recurrent ascites.  New small bowel dilatation likely representing a partial small bowel obstruction. Correlation with the physical exam is recommended.   Electronically Signed   By: Alcide Clever M.D.   On: 02/19/2015 12:55   Dg Abd 1 View  02/09/2015   CLINICAL DATA:  Constipated, abdominal distension  EXAM: ABDOMEN - 1 VIEW  COMPARISON:  02/08/2015  FINDINGS: The bowel gas pattern is normal. No radio-opaque calculi or other significant radiographic abnormality are seen.  IMPRESSION:  Negative.   Electronically Signed   By: Elige Ko   On: 02/09/2015 10:41   Dg Abd 1 View  02/08/2015   CLINICAL DATA:  Orogastric tube placement.  EXAM: ABDOMEN - 1 VIEW  COMPARISON:  None.  FINDINGS: The bowel gas pattern is normal. Distal tip of feeding tube is seen in expected position of proximal stomach. No radio-opaque calculi or other significant radiographic abnormality are seen.  IMPRESSION: Distal tip of feeding tube seen in proximal stomach.   Electronically Signed   By: Lupita Raider, M.D.   On: 02/08/2015 13:34   Ct Head Wo Contrast  02/13/2015   CLINICAL DATA:  Acute encephalopathy  EXAM: CT HEAD WITHOUT CONTRAST  TECHNIQUE: Contiguous axial images were obtained from the base of the skull through the vertex without intravenous contrast.  COMPARISON:  None.  FINDINGS: The bony calvarium is intact. Mild atrophic changes are noted. No findings to suggest acute hemorrhage, acute infarction or space-occupying mass lesion are noted.  IMPRESSION: Mild atrophy.  No acute abnormality is noted.   Electronically  Signed   By: Alcide Clever M.D.   On: 02/13/2015 15:39   US Renal  02/07/2015   CLINICAL DATA:  49 year old male with a history of acute renal failure, chronic renal failure, cirrhosis.  EXAM: RENAL / URINARY TRACT ULTRASOUND COMPLETE  COMPARISON:  None.  FINDINGS: Right Kidney:  Length: 12.3 cm. No evidence of right-sided hydronephrosis. Echogenicity similar to that of the visualized adjacent liver parenchyma. Flow confirmed in the hilum of the right kidney.  Left Kidney:  Length: 12.0 cm. Left kidney incompletely visualized, however, there does not appear to be significant hydronephrosis.  Moderate to large volume of ascites.  IMPRESSION: No evidence of left or right hydronephrosis to indicate obstruction.  Moderate to large volume of ascites.  Signed,  Yvone Neu. Loreta Ave, DO  [facility]  [facility]   Electronically Signed   By: Gilmer Mor D.O.    On: 02/07/2015 12:46   US Paracentesis  02/07/2015   CLINICAL DATA:  Abdominal distention. Alcoholic cirrhosis. Massive ascites. Request diagnostic and therapeutic paracentesis of up to 4 L max.  EXAM: ULTRASOUND GUIDED PARACENTESIS  COMPARISON:  None.  PROCEDURE: An ultrasound guided paracentesis was thoroughly discussed with the patient and questions answered. The benefits, risks, alternatives and complications were also discussed. The patient understands and wishes to proceed with the procedure. Written consent was obtained.  Ultrasound was performed to localize and mark an adequate pocket of fluid in the left lower quadrant of the abdomen. The area was then prepped and draped in the normal sterile fashion. 1% Lidocaine was used for local anesthesia. Under ultrasound guidance a 19 gauge Yueh catheter was introduced. Paracentesis was performed. The catheter was removed and a dressing applied.  COMPLICATIONS: None immediate  FINDINGS: A total of approximately 3.8 L of cloudy, yellow fluid was removed. A fluid sample was sent for laboratory analysis.  IMPRESSION: Successful ultrasound guided paracentesis yielding 3.8 L of ascites.  Read by: Brayton El PA-C   Electronically Signed   By: Gilmer Mor D.O.   On: 02/07/2015 12:08   Dg Chest Port 1 View  02/20/2015   CLINICAL DATA:  Congestive failure  EXAM: PORTABLE CHEST - 1 VIEW  COMPARISON:  02/19/2015  FINDINGS: Cardiac shadow is stable. Right-sided PICC line is again identified and stable. Patchy opacities are again identified throughout both lungs. No sizable effusion or pneumothorax is noted.  IMPRESSION: Overall stable appearance of the chest when given some technical variations.   Electronically Signed   By: Alcide Clever M.D.   On: 02/20/2015 07:15   Dg Chest Port 1 View  02/19/2015   CLINICAL DATA:  Respiratory failure  EXAM: PORTABLE CHEST - 1 VIEW  COMPARISON:  02/18/2015  FINDINGS: Progressive interstitial and airspace opacity in the bilateral  lungs, asymmetric to the right. No pleural effusion. Normal heart size for technique.  Stable right upper extremity PICC, tip at the upper right atrium.  IMPRESSION: Progressive bilateral lung opacity. Edema or pneumonia could have this appearance.   Electronically Signed   By: Marnee Spring M.D.   On: 02/19/2015 07:02   Dg Chest Port 1 View  02/18/2015   CLINICAL DATA:  Respiratory failure, cirrhosis, encephalopathy.  EXAM: PORTABLE CHEST - 1 VIEW  COMPARISON:  Portable chest x-ray of February 17, 2015  FINDINGS: The lungs are slightly less well inflated today. Confluent alveolar opacity is more conspicuous in the right upper lobe. The interstitial markings elsewhere in both lungs remain mildly increased. The cardiac  silhouette is top-normal in size. The pulmonary vascularity is not engorged. The PICC line tip projects over the junction of the middle and distal SVC.  IMPRESSION: Slight interval increase in conspicuity of alveolar opacity in the right upper lobe consistent with pneumonia. Mild interstitial edema is stable.   Electronically Signed   By: David  [place] M.D.   On: 02/18/2015 07:06   Dg Chest Port 1 View  02/17/2015   CLINICAL DATA:  Respiratory failure .  EXAM: PORTABLE CHEST - 1 VIEW  COMPARISON:  02/16/2015 .  FINDINGS: Interim extubation and removal of NG tube. Right PICC line stable position. Mediastinum and hilar structures normal. Cardiomegaly with mild pulmonary vascular prominence and interstitial prominence consistent mild congestive heart failure . No pleural effusion or pneumothorax.  IMPRESSION: 1. Interim removal of endotracheal tube and NG tube. Right PICC line in stable position. 2. Cardiomegaly with persistent diffuse interstitial prominence consistent with congestive heart failure. Pneumonitis cannot be excluded.   Electronically Signed   By: Maisie Fus  Register   On: 02/17/2015 07:07   Dg Chest Port 1 View  02/16/2015   CLINICAL DATA:  Hypoxia  EXAM: PORTABLE CHEST - 1 VIEW   COMPARISON:  February 15, 2015  FINDINGS: Endotracheal tube tip is 3.2 cm above the carina. Central catheter tip is in the right atrium. Nasogastric tube tip and side port are below the diaphragm. No pneumothorax.  There is mild interstitial edema. There is no airspace consolidation. Heart is normal in size and contour. Pulmonary vascularity are normal. No adenopathy. There is a probable bone island in the proximal right humerus, stable. There is arthropathy in both shoulders.  IMPRESSION: Tube and catheter positions as described without pneumothorax. Mild generalized interstitial edema without airspace consolidation. No change in cardiac silhouette.   Electronically Signed   By: Bretta Bang III M.D.   On: 02/16/2015 07:10   Dg Chest Port 1 View  02/15/2015   CLINICAL DATA:  Cirrhosis, varices and gastrointestinal bleeding. Intubated patient.  EXAM: PORTABLE CHEST - 1 VIEW  COMPARISON:  Single view of the chest 02/14/2015 and 02/11/2015.  FINDINGS: Support tubes and lines are unchanged. Lung volumes are low but the lungs are clear. Heart size is normal. No pneumothorax or pleural effusion.  IMPRESSION: No change in support apparatus.  No acute disease.   Electronically Signed   By: Drusilla Kanner M.D.   On: 02/15/2015 15:15   Dg Chest Port 1 View  02/14/2015   CLINICAL DATA:  Acute encephalopathy  EXAM: PORTABLE CHEST - 1 VIEW  COMPARISON:  02/11/2015  FINDINGS: Cardiomediastinal silhouette is stable. Again noted NG tube and endotracheal tube unchanged in position. No acute infiltrate or pulmonary edema. Hypoinflation again noted.  IMPRESSION: No active disease. Stable support apparatus. Hypoinflation again noted.   Electronically Signed   By: Natasha Mead M.D.   On: 02/14/2015 09:04   Dg Chest Port 1 View  02/11/2015   CLINICAL DATA:  Respiratory failure, intubated patient.  EXAM: PORTABLE CHEST - 1 VIEW  COMPARISON:  Portable chest x-ray of February 09, 2015  FINDINGS: The lungs remain hypoinflated. The  interstitial markings remain diffusely increased but have improved slightly. The cardiac silhouette is mildly enlarged though stable. The central pulmonary vascularity prominent. The endotracheal tube tip lies 4.5 cm above the carina. The esophagogastric tube tip projects in the gastric cardia and is stable. There are degenerative changes of both shoulders.  IMPRESSION: Persistent bilateral hypoinflation. Slight interval improvement in the appearance of the pulmonary  interstitium. There is no alveolar pneumonia. Support tubes are in reasonable position.   Electronically Signed   By: David  [place]and M.D.   On: 02/11/2015 07:37   Dg Chest Port 1 View  02/09/2015   CLINICAL DATA:  Central line repositioning  EXAM: PORTABLE CHEST - 1 VIEW  COMPARISON:  February 07, 2015  FINDINGS: Central catheter tip is at the junction of the left innominate vein and superior vena cava. Endotracheal tube tip is 2.5 cm above carina. Nasogastric tube tip and side port are in the stomach. No pneumothorax. There is no edema or consolidation. Heart is upper normal in size with pulmonary vascularity within normal limits. No adenopathy. There is an apparent Hill-Sachs defect in the left humeral head. There is arthropathy in each shoulder.  IMPRESSION: Tube and catheter positions as described without pneumothorax. No edema or consolidation.   Electronically Signed   By: Bretta Bang III M.D.   On: 02/09/2015 02:41   Dg Chest Port 1 View  02/07/2015   CLINICAL DATA:  Withdrawal of central line  EXAM: PORTABLE CHEST - 1 VIEW  COMPARISON:  Portable exam 1348 hours compared to 02/06/2015  FINDINGS: LEFT jugular central venous catheter with tip projecting over cavoatrial junction.  Minimal enlargement of cardiac silhouette.  Pulmonary vascular congestion.  Perihilar infiltrates question edema versus infection.  Low lung volumes.  No pleural effusion or pneumothorax.  Tip of endotracheal tube at carina, recommend withdrawal 2 cm.  IMPRESSION:  Tip of LEFT jugular 9 not projects over cavoatrial junction.  Tip of endotracheal tube now at carina, recommend withdrawal 2 cm.  Perihilar infiltrates question edema versus infection.  Critical Value/emergent results were called by telephone at the time of interpretation on 02/07/2015 at 1403 hr to Onalee Hua RN (in ICU Stepdown), who verbally acknowledged these results.   Electronically Signed   By: Ulyses Southward M.D.   On: 02/07/2015 14:04   Dg Chest Port 1 View  02/06/2015   CLINICAL DATA:  Patient with stroke status post line placement.  EXAM: PORTABLE CHEST - 1 VIEW  COMPARISON:  Earlier same date  FINDINGS: Interval insertion of endotracheal tube terminating 15 mm superior to the carina. New left IJ central venous catheter tip projects over the right atrium. Stable cardiac and mediastinal contours. Low lung volumes. Unchanged diffuse bilateral coarse heterogeneous opacities. No pleural effusion or pneumothorax.  IMPRESSION: New left IJ central venous catheter tip projects over the right atrium, consider retraction.  ET tube terminates in the distal trachea.  Unchanged heterogeneous opacities bilaterally potentially secondary to bronchitis.  These results were called by telephone at the time of interpretation on 02/06/2015 at 4:37 pm to Dr. Lawanda Cousins , who verbally acknowledged these results.   Electronically Signed   By: Annia Belt M.D.   On: 02/06/2015 16:38   Dg Chest Port 1 View  02/06/2015   CLINICAL DATA:  49 year old male with shortness breath and chest pain for the past 12 hr. Vomiting.  EXAM: PORTABLE CHEST - 1 VIEW  COMPARISON:  No priors.  FINDINGS: Lung volumes are low. Severe diffuse peribronchial cuffing. No consolidative airspace disease. No pleural effusions. No evidence of pulmonary edema. Heart size is normal. Mediastinal contours are unremarkable.  IMPRESSION: 1. Severe diffuse peribronchial cuffing, concerning for severe acute bronchitis.   Electronically Signed   By: Trudie Reed M.D.    On: 02/06/2015 10:32   Dg Chest Port 1v Same Day  02/06/2015   CLINICAL DATA:  Hypoxia. Requested per MD  to assess whether radiographic distortion affects prior imaging of IJ line and ETT  EXAM: PORTABLE CHEST - 1 VIEW SAME DAY  COMPARISON:  02/06/2015 at 1604 hours  FINDINGS: Left internal jugular central venous line catheter tip again projects in the right atrium.  Endotracheal tube tip projects 1 cm above the carina. This is also stable.  Lung volumes are low. There is interstitial prominence but no areas of lung consolidation and no overt pulmonary edema. No pneumothorax.  IMPRESSION: 1. No change from the earlier study. 2. Left internal jugular central venous line tip again projects in the right atrium. Endotracheal tube tip projects 1 cm above the carina.   Electronically Signed   By: Amie Portland M.D.   On: 02/06/2015 18:00   Dg Abd Portable 1v  02/21/2015   CLINICAL DATA:  Status post nasogastric tube placement.  EXAM: PORTABLE ABDOMEN - 1 VIEW  COMPARISON:  02/21/2015 at 11:13 a.m.  FINDINGS: Nasogastric tube tip projects in the distal stomach, well positioned.  Air-filled prominent small bowel is again noted, unchanged from the earlier study.  IMPRESSION: Nasogastric tube tip well-positioned within the distal stomach.   Electronically Signed   By: Amie Portland M.D.   On: 02/21/2015 15:06   Dg Abd Portable 1v  02/17/2015   CLINICAL DATA:  Abdominal distention  EXAM: PORTABLE ABDOMEN - 1 VIEW  COMPARISON:  February 13, 2015  FINDINGS: There is no demonstrable bowel dilatation. No air-fluid levels are seen on this supine examination. No free air appreciable. No abnormal calcifications.  IMPRESSION: Bowel gas pattern overall unremarkable.   Electronically Signed   By: Bretta Bang III M.D.   On: 02/17/2015 11:16   Dg Abd Portable 1v  02/13/2015   CLINICAL DATA:  Abdominal distension.  EXAM: PORTABLE ABDOMEN - 1 VIEW  COMPARISON:  February 12, 2015.  FINDINGS: Stable dilated small bowel loops are  seen in the central and right side of the abdomen most consistent with ileus. No significant colonic dilatation is noted. No abnormal calcifications are noted.  IMPRESSION: Stable dilated small bowel loops are noted most consistent with ileus, but continued radiographic follow-up is recommended to rule out obstruction.   Electronically Signed   By: Lupita Raider, M.D.   On: 02/13/2015 12:07   Dg Abd Portable 1v  02/12/2015   CLINICAL DATA:  Ascites.  Ileus.  EXAM: PORTABLE ABDOMEN - 1 VIEW  COMPARISON:  02/09/2015 .  02/08/2015.  FINDINGS: NG tube noted coiled in stomach. Dilated loops of small large bowel noted consistent with adynamic ileus. Follow-up abdominal series suggested to demonstrate clearing and to exclude bowel obstruction. No free air. Probable ascites. No acute bony abnormality.  IMPRESSION: 1.  NG tube noted in good anatomic position.  2. Distended loops of small and large bowel consistent with adynamic ileus. Follow-up abdominal series suggested to demonstrate clearing and to exclude bowel obstruction.  3.  Probable ascites.   Electronically Signed   By: Maisie Fus  Register   On: 02/12/2015 07:10    Medical Consultants:  None  Other Consultants:  None   Debbora Presto, MD  [facility] Pager [phone]  If 7PM-7AM, please contact night-coverage [email or web address] Password [facility] 02/22/2015, 9:45 PM   LOS: 16 days   HPI/Subjective: No events overnight.   Objective: Filed Vitals:   02/22/15 0434 02/22/15 1100 02/22/15 1432 02/22/15 2029  BP: 101/63 103/76 105/78 106/62  Pulse: 108 86 116 110  Temp: 98.2 F (36.8 C) 97.8 F (36.6 C) 97.8 F (36.6  C) 98.4 F (36.9 C)  TempSrc: Oral Oral Oral Oral  Resp: 18 20 18 18   Height: 5\' 6"  (1.676 m)     Weight: 108 kg (238 lb 1.6 oz)     SpO2: 100% 100% 100% 100%    Intake/Output Summary (Last 24 hours) at 02/22/15 2145 Last data filed at 02/22/15 1853  Gross per 24 hour  Intake 658.83 ml  Output   1251 ml  Net -592.17 ml     Exam:   General: chronically ill appearing adult male in NAD   Neuro: Awake, alert, MAE/generalized weakness, speech clear, increased confusion  Cardiovascular: s1s2 rrr, no m/r/g, palpable pulses  Lungs: Even/non-labored on RA, lungs bilaterally clear anterior, diminished lateral/bases  Abdomen: Protuberant. Bowel sounds +, Ostomy bag RLQ  Musculoskeletal: No joint deformity or effusion.   Data Reviewed: Basic Metabolic Panel:  Recent Labs Lab 02/17/15 0520 02/18/15 0550 02/19/15 0630 02/20/15 0415 02/21/15 0428 02/22/15 0500  NA 142 136 135 134* 133*  --   K 3.1* 3.5 3.3* 2.9* 3.4*  --   CL 116* 111 110 109 109  --   CO2 21* 19* 20* 20* 19*  --   GLUCOSE 90 94 96 84 117*  --   BUN 6 5* 5* <5* 5*  --   CREATININE 1.00 0.94 0.88 0.92 0.97  --   CALCIUM 7.5* 7.4* 7.4* 7.3* 7.3*  --   MG 1.8 1.7 1.7 1.5* 1.5* 1.8  PHOS 2.7 2.4* 2.4* 2.7 2.1* 3.0   Liver Function Tests:  Recent Labs Lab 02/16/15 0425 02/17/15 0520 02/21/15 0428  AST 57* 61* 90*  ALT 17 18 28   ALKPHOS 62 56 83  BILITOT 2.4* 2.1* 2.2*  PROT 6.0* 5.9* 6.3*  ALBUMIN 1.8* 1.8* 1.7*   No results for input(s): LIPASE, AMYLASE in the last 168 hours. No results for input(s): AMMONIA in the last 168 hours. CBC:  Recent Labs Lab 02/16/15 0425 02/17/15 0520 02/18/15 0550 02/20/15 0415 02/20/15 0538 02/20/15 1430 02/21/15 0428  WBC 13.1* 16.3* 14.7* 11.8*  --   --  12.0*  HGB 8.0* 7.1* 7.5* 6.8* 6.8* 7.8* 7.4*  HCT 24.4* 21.8* 23.2* 20.7* 20.6* 22.8* 21.7*  MCV 87.5 86.9 86.6 84.5  --   --  83.8  PLT 66* 52* 62* 57*  --   --  78*   Cardiac Enzymes: No results for input(s): CKTOTAL, CKMB, CKMBINDEX, TROPONINI in the last 168 hours. BNP: Invalid input(s): POCBNP CBG:  Recent Labs Lab 02/22/15 0431 02/22/15 0843 02/22/15 1250 02/22/15 1626 02/22/15 2026  GLUCAP 77 79 100* 108* 98    Recent Results (from the past 240 hour(s))  Culture, blood (routine x 2)     Status:  None   Collection Time: 02/19/15  1:25 PM  Result Value Ref Range Status   Specimen Description BLOOD LEFT ARM  Final   Special Requests IN PEDIATRIC BOTTLE 1CC  Final   Culture  Setup Time   Final    GRAM POSITIVE COCCI IN CLUSTERS AEROBIC BOTTLE ONLY CRITICAL RESULT CALLED TO, READ BACK BY AND VERIFIED WITH: C. LITTLE,RN AT 0819 ON [id number] BY Lucienne Capers    Culture   Final    STAPHYLOCOCCUS SPECIES (COAGULASE NEGATIVE) THE SIGNIFICANCE OF ISOLATING THIS ORGANISM FROM A SINGLE SET OF BLOOD CULTURES WHEN MULTIPLE SETS ARE DRAWN IS UNCERTAIN. PLEASE NOTIFY THE MICROBIOLOGY DEPARTMENT WITHIN ONE WEEK IF SPECIATION AND SENSITIVITIES ARE REQUIRED. Performed at [facility]    Report Status 02/22/2015  FINAL  Final  Culture, blood (routine x 2)     Status: None (Preliminary result)   Collection Time: 02/19/15  1:29 PM  Result Value Ref Range Status   Specimen Description BLOOD LEFT HAND  Final   Special Requests BOTTLES DRAWN AEROBIC ONLY 7CC  Final   Culture   Final    NO GROWTH 3 DAYS Performed at [facility]    Report Status PENDING  Incomplete     Scheduled Meds: . feeding supplement (ENSURE ENLIVE)  237 mL Oral BID BM  . furosemide  40 mg Oral Daily  . lactulose  10 g Oral TID  . pantoprazole  40 mg Oral BID  . piperacillin-tazobactam (ZOSYN)  IV  3.375 g Intravenous Q8H  . rifaximin  550 mg Oral BID  . sodium chloride  10-40 mL Intracatheter Q12H  . thiamine  100 mg Oral Daily  . vancomycin  1,000 mg Intravenous Q24H   Continuous Infusions: . dextrose 5% lactated ringers 10 mL (02/20/15 2016)

## 2015-02-22 NOTE — Progress Notes (Signed)
Patient had a fall, staff noted patient on the floor in room, patient denied hitting head or any part of the body, no wound/injury noted. Vital done, patient assisted back to bed using a lift. Dr. Izola Price notified, no new order given, will continue to assess patient.

## 2015-02-22 NOTE — Progress Notes (Signed)
eLink Physician-Brief Progress Note Patient Name: Francisco [name] DOB: 1966/04/01 MRN: [id number]   Date of Service  02/22/2015  HPI/Events of Note  Anxiety.  eICU Interventions  Will order Xanax 0.25 mg PO now.      Intervention Category Minor Interventions: Agitation / anxiety - evaluation and management  Sommer,Steven Eugene 02/22/2015, 3:28 AM

## 2015-02-22 NOTE — Progress Notes (Signed)
Had 6runs of V-tach then 5, Patient denies any chest pain BP-84/47, HR-113, 96%-RA. Dr. Izola Price notified and stated to continue to monitor patient.

## 2015-02-23 DIAGNOSIS — Z515 Encounter for palliative care: Secondary | ICD-10-CM

## 2015-02-23 DIAGNOSIS — Z7189 Other specified counseling: Secondary | ICD-10-CM

## 2015-02-23 LAB — CBC
HCT: 19.7 % — ABNORMAL LOW (ref 39.0–52.0)
Hemoglobin: 6.8 g/dL — CL (ref 13.0–17.0)
MCH: 28.7 pg (ref 26.0–34.0)
MCHC: 34.5 g/dL (ref 30.0–36.0)
MCV: 83.1 fL (ref 78.0–100.0)
PLATELETS: 93 10*3/uL — AB (ref 150–400)
RBC: 2.37 MIL/uL — ABNORMAL LOW (ref 4.22–5.81)
RDW: 16.3 % — AB (ref 11.5–15.5)
WBC: 11.3 10*3/uL — ABNORMAL HIGH (ref 4.0–10.5)

## 2015-02-23 LAB — BASIC METABOLIC PANEL
Anion gap: 6 (ref 5–15)
BUN: 6 mg/dL (ref 6–20)
CALCIUM: 7.4 mg/dL — AB (ref 8.9–10.3)
CHLORIDE: 106 mmol/L (ref 101–111)
CO2: 21 mmol/L — AB (ref 22–32)
CREATININE: 0.99 mg/dL (ref 0.61–1.24)
GFR calc Af Amer: >60 mL/min (ref >60)
GFR calc non Af Amer: >60 mL/min (ref >60)
Glucose, Bld: 118 mg/dL — ABNORMAL HIGH (ref 65–99)
Potassium: 2.9 mmol/L — ABNORMAL LOW (ref 3.5–5.1)
SODIUM: 133 mmol/L — AB (ref 135–145)

## 2015-02-23 LAB — GLUCOSE, CAPILLARY
GLUCOSE-CAPILLARY: 105 mg/dL — AB (ref 65–99)
Glucose-Capillary: 101 mg/dL — ABNORMAL HIGH (ref 65–99)
Glucose-Capillary: 105 mg/dL — ABNORMAL HIGH (ref 65–99)
Glucose-Capillary: 106 mg/dL — ABNORMAL HIGH (ref 65–99)
Glucose-Capillary: 108 mg/dL — ABNORMAL HIGH (ref 65–99)
Glucose-Capillary: 92 mg/dL (ref 65–99)

## 2015-02-23 LAB — PROTIME-INR
INR: 3.2 — AB (ref 0.00–1.49)
PROTHROMBIN TIME: 32.1 s — AB (ref 11.6–15.2)

## 2015-02-23 LAB — FIBRINOGEN: Fibrinogen: <60 mg/dL — CL (ref 204–475)

## 2015-02-23 LAB — APTT: APTT: 63 s — AB (ref 24–37)

## 2015-02-23 LAB — MAGNESIUM: MAGNESIUM: 1.5 mg/dL — AB (ref 1.7–2.4)

## 2015-02-23 LAB — PREPARE RBC (CROSSMATCH)

## 2015-02-23 LAB — PHOSPHORUS: PHOSPHORUS: 2.8 mg/dL (ref 2.5–4.6)

## 2015-02-23 MED ORDER — POTASSIUM CHLORIDE CRYS ER 20 MEQ PO TBCR
30.0000 meq | EXTENDED_RELEASE_TABLET | Freq: Once | ORAL | Status: AC
  Administered 2015-02-23: 30 meq via ORAL
  Filled 2015-02-23 (×2): qty 1

## 2015-02-23 MED ORDER — POTASSIUM CHLORIDE 20 MEQ/15ML (10%) PO SOLN
40.0000 meq | Freq: Once | ORAL | Status: DC
  Filled 2015-02-23 (×3): qty 30

## 2015-02-23 MED ORDER — SODIUM CHLORIDE 0.9 % IV SOLN: Freq: Once | INTRAVENOUS | Status: DC

## 2015-02-23 MED ORDER — POTASSIUM CHLORIDE 20 MEQ/15ML (10%) PO SOLN
40.0000 meq | Freq: Once | ORAL | Status: AC
  Administered 2015-02-23: 40 meq via ORAL
  Filled 2015-02-23: qty 30

## 2015-02-23 NOTE — Progress Notes (Signed)
Patient had another runs of V-tach, denies any pain/distress, viatls WNL, paged Dr. Izola Price, no call back. Will continue to assess patient.

## 2015-02-23 NOTE — Consult Note (Signed)
Consultation Note Date: 02/23/2015   Patient Name: Francisco [name]  DOB: May 18, 1966  MRN: [id number]  Age / Sex: 49 y.o., male   PCP: Pcp Not In System Referring Physician: Dorothea Ogle, MD  Reason for Consultation: Establishing goals of care and Psychosocial/spiritual support  Palliative Care Assessment and Plan Summary of Established Goals of Care and Medical Treatment Preferences    Palliative Care Discussion Held Today:    This NP Lorinda Creed reviewed medical records, received report from team, assessed the patient and then meet at the patient's bedside along with his wife/Francisco [name], and several other family members to discuss diagnosis prognosis, GOC, EOL wishes disposition and options.  A detailed discussion was had today regarding advanced directives.  Concepts specific to code status, artifical feeding and hydration, continued IV antibiotics and rehospitalization was had.  The difference between a aggressive medical intervention path  and a palliative comfort care path for this patient at this time was had.  Values and goals of care important to patient and family were attempted to be elicited.  Concept of Hospice and Palliative Care were discussed  Natural trajectory and expectations of ES Liver disease were discussed as well as his poor prognosis.  Questions and concerns addressed.   Family encouraged to call with questions or concerns.  PMT will continue to support holistically.  Patient and his wife are from [place] but at this point in time plan to stay in [place] with family.  The patient and his sister "Pouchy" verbalize an acceptance of his limited prognosis but his wife and mother continue to struggle with the limitations of  medical interventions to treat and cure.    Primary Decision Maker: Patient himself with family support  Goals of Care/Code Status/Advance Care Planning:   Code Status:  Full code (stongly encouraged to consider DNR status knowing outcomes of like  patients)   Patient and family are open to all offered and available medical intervetnions to prolong life at this time   Symptom Management:   Weakness: requested PT evaluation, will write order  Psycho-social/Spiritual:   Support System: family  Desire for further Chaplaincy support:no-declines at this time  Prognosis: < 6 months  Discharge Planning:  Pending       Chief Complaint: increased dyspnea and BLE edema  History of Present Illness:  49 y.o. male who presented this morning to the emergency room with complaints of weakness and shortness of breath. While being evaluated in the emergency room he vomited 4-500 mL of dark red blood. Apparently he had 1 episode of a similar amount of blood prior to arrival to the emergency room. On further questioning patient says his stools were black yesterday. He is a poor historian but apparently had recently been hospitalized in [place] and was visiting family here this weekend with his wife. He says he just got out of rehabilitation last week after a prolonged illness. 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 He knows that he has had at least one prior variceal bleed and underwent EGD and banding.  Labs are reviewed hemoglobin was 6.3 on presentation INR 1.87 creatinine 2.1.   Patient and family support sobriety since April of 2016.  He faces a terminal situation with limited treatment options and now is making decision regarding advanced directives and anticipatory care needs.    Primary Diagnoses  Present on Admission:  . GI bleeding  Palliative Review  of Systems:    -weakness  I have reviewed the medical record, interviewed the patient and family, and examined the patient. The following aspects are pertinent.  Past Medical History  Diagnosis Date  . Hepatic cirrhosis   . Esophageal varices     last banding in [place] June 2016  .  Chronic renal failure     was on hemodialysis in June 2016  . Congestive heart failure     questionable hx & on lasix  . Ascites    Social History   Social History  . Marital Status: Married    Spouse Name: N/A  . Number of Children: N/A  . Years of Education: N/A   Social History Main Topics  . Smoking status: Former Smoker    Quit date: 10/02/2014  . Smokeless tobacco: None     Comment: off and on for 20 years  . Alcohol Use: No     Comment: Quit drinking EtOH prior to prolonged admission in April 2016  . Drug Use: None  . Sexual Activity: Not Asked   Other Topics Concern  . None   Social History Narrative   Patient is from [place]. Hospitalized with variceal bleed and banding this Spring and discharged to [facility] June 2016. Discharged from SNF 1 week ago. Previously worked as a Paediatric nurse. Married.   Family History  Problem Relation Age of Onset  . COPD Mother   . Asthma Mother   . Leukemia Maternal Grandmother   . Congestive Heart Failure Maternal Grandfather   . Cancer Other     2 aunts with known ca   Scheduled Meds: . sodium chloride   Intravenous Once  . feeding supplement (ENSURE ENLIVE)  237 mL Oral BID BM  . furosemide  40 mg Oral Daily  . lactulose  10 g Oral TID  . pantoprazole  40 mg Oral BID  . piperacillin-tazobactam (ZOSYN)  IV  3.375 g Intravenous Q8H  . potassium chloride  40 mEq Oral Once  . rifaximin  550 mg Oral BID  . sodium chloride  10-40 mL Intracatheter Q12H  . thiamine  100 mg Oral Daily  . vancomycin  1,000 mg Intravenous Q24H   Continuous Infusions: . dextrose 5% lactated ringers 10 mL (02/20/15 2016)   PRN Meds:.acetaminophen (TYLENOL) oral liquid 160 mg/5 mL, haloperidol lactate, sodium chloride Medications Prior to Admission:  Prior to Admission medications   Medication Sig Start Date End Date Taking? Authorizing Provider  albuterol (PROVENTIL HFA;VENTOLIN HFA) 108 (90 BASE) MCG/ACT inhaler Inhale 1-2 puffs into the lungs every 6 (six)  hours as needed for wheezing or shortness of breath.   Yes Historical Provider, MD  Ca Carbonate-Mag Hydroxide (ROLAIDS) 550-110 MG CHEW Chew 1 tablet by mouth as needed (for heartburn).   Yes Historical Provider, MD  furosemide (LASIX) 40 MG tablet Take 40 mg by mouth daily.   Yes Historical Provider, MD  lactulose (CHRONULAC) 10 GM/15ML solution Take 30 g by mouth every 8 (eight) hours.   Yes Historical Provider, MD  naproxen sodium (ANAPROX) 220 MG tablet Take 440 mg by mouth every 12 (twelve) hours as needed (for pain).   Yes Historical Provider, MD   No Known Allergies CBC:    Component Value Date/Time   WBC 11.3* 02/23/2015 0515   HGB 6.8* 02/23/2015 0515   HCT 19.7* 02/23/2015 0515   PLT 93* 02/23/2015 0515   MCV 83.1 02/23/2015 0515   NEUTROABS 4.0 02/06/2015 0930   LYMPHSABS 3.2 02/06/2015 0930  MONOABS 1.1* 02/06/2015 0930   EOSABS 0.1 02/06/2015 0930   BASOSABS 0.0 02/06/2015 0930   Comprehensive Metabolic Panel:    Component Value Date/Time   NA 133* 02/23/2015 0515   K 2.9* 02/23/2015 0515   CL 106 02/23/2015 0515   CO2 21* 02/23/2015 0515   BUN 6 02/23/2015 0515   CREATININE 0.99 02/23/2015 0515   GLUCOSE 118* 02/23/2015 0515   CALCIUM 7.4* 02/23/2015 0515   AST 90* 02/21/2015 0428   ALT 28 02/21/2015 0428   ALKPHOS 83 02/21/2015 0428   BILITOT 2.2* 02/21/2015 0428   PROT 6.3* 02/21/2015 0428   ALBUMIN 1.7* 02/21/2015 0428    Physical Exam:  Vital Signs: BP 107/72 mmHg  Pulse 114  Temp(Src) 98 F (36.7 C) (Oral)  Resp 16  Ht  (1.778 m)  Wt 107.8 kg (237 lb 10.5 oz)  BMI 34.10 kg/m2  SpO2 100% SpO2: SpO2: 100 % O2 Device: O2 Device: Not Delivered O2 Flow Rate: O2 Flow Rate (L/min): 2 L/min Intake/output summary:  Intake/Output Summary (Last 24 hours) at 02/23/15 1127 Last data filed at 02/23/15 1040  Gross per 24 hour  Intake 1453.33 ml  Output    250 ml  Net 1203.33 ml   LBM: Last BM Date: 02/23/15 Baseline Weight: Weight: 103.4 kg  (227 lb 15.3 oz) Most recent weight: Weight: 107.8 kg (237 lb 10.5 oz)  Exam Findings:   General: chronically ill appearing, weak HEENT: moist buccal membranes, no exudate  noted CVS: tachycardic Resp: decreased in bases Abd: distended  Extrem: BLE +3 edema            Palliative Performance Scale: 30 %                Additional Data Reviewed: Recent Labs     02/21/15  0428  02/23/15  0515  WBC  12.0*  11.3*  HGB  7.4*  6.8*  PLT  78*  93*  NA  133*  133*  BUN  5*  6  CREATININE  0.97  0.99     Time In: 1145 Time Out: 1300 Time Total: 75 min  Greater than 50%  of this time was spent counseling and coordinating care related to the above assessment and plan.  Discussed with  Dr  Izola Price  Signed by: Lorinda Creed, NP  Canary Brim, NP  02/23/2015, 11:27 AM  Please contact Palliative Medicine Team phone at [phone] for questions and concerns.   See AMION for contact information

## 2015-02-23 NOTE — Progress Notes (Signed)
Patient does not want blood transfusion started until 930am.

## 2015-02-23 NOTE — Progress Notes (Signed)
Nutrition Follow-up  DOCUMENTATION CODES:   Obesity unspecified  INTERVENTION:  - Continue Ensure Enlive BID, each supplement provides 350 kcal and 20 grams of protein - RD will continue to monitor for needs  NUTRITION DIAGNOSIS:   Inadequate oral intake related to inability to eat as evidenced by NPO status. -resolving with diet advancement and minimal intakes  GOAL:   Patient will meet greater than or equal to 90% of their needs -unmet  MONITOR:   Diet advancement, PO intake, Supplement acceptance, Weight trends, Labs, I & O's  ASSESSMENT:   Patient admitted with worsening dyspnea and lower extremity edema. Recently discharged from SNF after long hospitalization at OSH in [place] with GIB from esophageal varices post banding.  8/23 Diet advancement as follows:  8/21 @ 1056: NPO 8/22 @ 1237: CLD 8/22 @ 2043: Regular  Per chart review, pt consumed 25% of breakfast today which he reports was bacon, eggs, and [place] toast. Pt eating a bowl of dry Ramen-style noodles at time of RD visit. He denies abdominal pain, nausea, or emesis with PO intakes.  NGT was removed yesterday per MD order.   Pt irritated with RD at time of visit so further questions were not asked at that time. Not meeting needs. Encourage intake of Ensure Enlive.   Medications reviewed. Labs reviewed; CBGs: 77-108 mg/dL, Na: 161 mmol/L, K: 2.9 mmol/L, Ca: 7.4 mg/dL, Mg: 1.5 mg/dL.    8/17 - Pt was extubated yesterday AM and consumed 50% of CLD for lunch.  - Diet further advanced to FLD yesterday afternoon.  - Visualized breakfast tray with 100% orange juice and jello consumed, all other items untouched. - Will order Ensure Enlive to supplement as pt is consuming minimal PO at this time.  - Needs have been re-estimated s/p extubation.  8/15 - Pt tolerating trickle feeds with no plan to advance rate - Pt intubated with MV: 17.2 L/min   Diet Order:  Diet regular Room service appropriate?: Yes; Fluid  consistency:: Thin  Skin:  Reviewed, no issues  Last BM:  8/23  Height:   Ht Readings from Last 1 Encounters:  02/23/15  (1.778 m)    Weight:   Wt Readings from Last 1 Encounters:  02/23/15 237 lb 10.5 oz (107.8 kg)    Ideal Body Weight:  75.5 kg  BMI:  Body mass index is 34.1 kg/(m^2).  Estimated Nutritional Needs:   Kcal:  1600-1800  Protein:  85-95 grams  Fluid:  2.1L/day  EDUCATION NEEDS:   No education needs identified at this time     Trenton Gammon, RD, LDN Inpatient Clinical Dietitian Pager # [phone] After hours/weekend pager # [phone]

## 2015-02-23 NOTE — Progress Notes (Signed)
CRITICAL VALUE ALERT  Critical value received:  Hgb of 6.8, and fibrinogen of less than 60  Date of notification:  02/23/15  Time of notification:  0559  Critical value read back:Yes.    Nurse who received alert:  Linward Natal, RN  MD notified (1st page):K  Schorr   Time of first page:  0603  MD notified (2nd page):  Time of second page:  Responding MD:  N/A  Time MD responded:  N/A  NP put in orders for potassium and for one unit of blood to be transfused.

## 2015-02-23 NOTE — Progress Notes (Signed)
Patient ID: Francisco [name], male   DOB: 08/29/1965, 49 y.o.   MRN: [id number]  [facility] PROGRESS NOTE  Francisco [name] JYN:[id number] DOB: Apr 11, 1966 DOA: 02/06/2015   PCP: pt has no PCP, will need case manager consult for assistance with referral.   Brief narrative:    Patient admitted with worsening dyspnea and lower extremity edema. Recently discharged from SNF after long hospitalization at OSH in [place] with GIB from esophageal varices post banding.  Major Studies:  Portable CXR 8/6 - Peribronchial cuffing. No consolidation or effusion. TTE 8/7 - EF 55-60%. No wall motion abnormality. Grade 1 diastolic dysfunction. Hazy density on AV.  SIGNIFICANT EVENTS: 8/06 Admit to ICU 8/06 Transfuse 1u PRBCs 8/06 Elective intubation & Left IJ placed 8/06 EGD w/o varices but w/ small peptic ulcers 8/07 paracentesis 3.8 liters.  8/11 slowly increasing vasopressor requirement 8/12 Pink tinged output during lactulose enema overnight. On Fentanyl gtt for agitation, now patient comatose & nonresponsive now. Received 1 dose of Ativan in last 24 hours. Continues to require vasopressor support. 8/12 Paracentesis  8/13 Unresponsive most of the time and mostly breathes with vent per RN. S/p Paracentesis 2L yesterday and now with significant leak at puncture site - requiring ostomy bag to collect drainage. Not actively bleeing. Not on sedation. On neo with MAP> 65 and sbp 86. Ongoing adynamc ileus +  8/14 More responsive, 1L ascites leak overnight through bag, remains on neo 8/16 neo weaned off. Pt on PSV 5/5, 30% and tolerating well. Mouthing for ETT to be removed. RT reports ETT has moved in/out since yesterday due to holder. 750 ml out of para site in last 12 hours 8/18 extubated stable 8/22 Transferred to [facility] for continuation of care  8/23 Hg drop from 7.4 --> 6.8, refused transfusion of blood  Assessment/Plan:    Acute respiratory failure with hypoxia, VDRF - secondary  to septic shock (SBP with Bacillus +), acute on chronic diastolic CHF  - OETT 8/6 >> extubated 8/18 - Oxygen for saturations > 92% - Currently stable on room air. - Intermittent CXR as indicated recommended by PCCM team  - Pulmonary hygiene: IS, mobilize  Hypotension - secondary to sedation, vs septic shock from / SBP (Bacillus sp, ? 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 - No need for TEE urgently per their review of echo. Finding likely artefact. 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 Previously on HD. UOP fluctuates. - Cr has been WNL - monitor urine output    Lactic Acidosis  - Resolved  Hypokalemia  - with underlying hpomagnesemia  - continue to supplement both electrolytes  - repeat levels again in am and supplement as indicated   Acute blood loss anemia due to GIB  - secondary to peptic ulcers (pt has been on Aleve prior to admit) - also has hx of variceal bleed, s/p OSH banding June 2016. 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 May be contaminant - Pt covered with antibiotics, see below   Acute encephalopathy  - Hepatic vs medication induced, previously on fentanyl gtt.  - CT head negative.  - mentals status fluctuating, more confused this AM  - Continue lactulose / xifaxin  - Continue Thiamine IV daily    Obesity  - Body mass index is 34.1 kg/(m^2).   DVT prophylaxis - SCD's  Code Status: Full.  Family Communication:  plan of care discussed with the patient, pt has declined my offer to update family Disposition Plan: Awaiting for PT evaluation to determined if pt eligible for SNF. Also waiting for final blood cultures to tailor down ABX as indicated. Consulted PCT for further recommendations.   IV access:  Peripheral IV  Procedures and diagnostic studies:    Dg Abd 1 View 02/21/2015  No significant change in moderately dilated small bowel loops. Distal SBO cannot be excluded.     Dg Abd 1 View 02/19/2015  Likely recurrent ascites.  New small bowel dilatation likely representing a partial small bowel obstruction. Correlation with the physical exam is recommended.    Dg Abd 1 View  02/08/2015    Distal tip of feeding tube seen in proximal stomach.    Ct Head Wo Contrast  02/13/2015   Mild atrophy.  No acute abnormality is noted.     US Renal  02/07/2015   No evidence of left or right hydronephrosis to indicate obstruction.  Moderate to large volume of ascites.  US Paracentesis  02/07/2015  Successful ultrasound guided paracentesis yielding 3.8 L of ascites.    Dg Chest Port 1 View  02/19/2015  Progressive bilateral lung opacity. Edema or pneumonia could have this appearance.     Dg Chest Port 1 View  02/18/2015  Slight interval increase  in conspicuity of alveolar opacity in the right upper lobe consistent with pneumonia. Mild interstitial edema is stable.    Dg Chest Port 1 View  02/11/2015  Persistent bilateral hypoinflation. Slight interval improvement in the appearance of the pulmonary interstitium. There is no alveolar pneumonia.   Dg Abd Portable 1v  02/21/2015   Nasogastric tube tip well-positioned within the distal stomach.    Dg Abd Portable   02/17/2015  Bowel gas pattern overall unremarkable.     Medical Consultants:  PCT   Microbiology data and ABX  BCx2 8/19 >> neg Ascites 8/8>>>Bacillus species & Coag Neg Staph Cath tip culture 8/10 >> neg BCx2 8/19>> 1/2 bottles Coag neg staph  Rocephin, start date 8/6 >> 8/18 Vancomycin, 8/8 >> 8/11 restart 8/18 Pip-tazo 8/18 >>   Debbora Presto, MD  [facility] Pager [phone]  If 7PM-7AM, please contact night-coverage [email or web address] Password [facility] 02/23/2015, 6:52 PM   LOS: 17 days   HPI/Subjective: No events overnight.   Objective: Filed Vitals:   02/23/15 0433 02/23/15 1009 02/23/15 1040 02/23/15 1335  BP: 104/72 115/80 107/72 113/74  Pulse: 110 111 114 113  Temp: 98.1 F (36.7 C) 97.9 F (36.6 C) 98 F (36.7 C) 98.2 F (36.8 C)  TempSrc: Oral Oral Oral Oral  Resp: 20 22 16 18   Height: 5\' 10"  (1.778 m)     Weight: 107.8 kg (237 lb 10.5 oz)     SpO2: 100% 100% 100% 100%    Intake/Output Summary (Last 24 hours) at 02/23/15 1610 Last data filed at 02/23/15 1721  Gross per 24 hour  Intake   1740 ml  Output    150 ml  Net   1590 ml    Exam:   General: chronically ill appearing adult male in NAD   Neuro: Awake, alert, speech clear, increased confusion  Cardiovascular: s1s2 rrr, no m/r/g, palpable pulses  Lungs:non-labored on RA, lungs bilaterally clear anterior, diminished lateral/bases  Abdomen: Protuberant. Bowel sounds +, Ostomy bag RLQ  Musculoskeletal: No joint deformity or effusion.   Data Reviewed: Basic Metabolic  Panel:  Recent Labs Lab 02/18/15 0550 02/19/15 0630 02/20/15 0415 02/21/15 0428 02/22/15 0500 02/23/15 0515  NA 136 135 134* 133*  --  133*  K 3.5 3.3* 2.9* 3.4*  --  2.9*  CL 111 110 109 109  --  106  CO2 19* 20* 20* 19*  --  21*  GLUCOSE 94 96 84 117*  --  118*  BUN 5* 5* <5* 5*  --  6  CREATININE 0.94 0.88 0.92 0.97  --  0.99  CALCIUM 7.4* 7.4* 7.3* 7.3*  --  7.4*  MG 1.7 1.7 1.5* 1.5* 1.8 1.5*  PHOS 2.4* 2.4* 2.7 2.1* 3.0 2.8   Liver Function Tests:  Recent Labs Lab 02/17/15 0520 02/21/15 0428  AST 61* 90*  ALT 18 28  ALKPHOS 56 83  BILITOT 2.1* 2.2*  PROT 5.9* 6.3*  ALBUMIN 1.8* 1.7*   No results for input(s): LIPASE, AMYLASE in the last 168 hours. No results for input(s): AMMONIA in the last 168 hours. CBC:  Recent Labs Lab 02/17/15 0520 02/18/15 0550 02/20/15 0415 02/20/15 0538 02/20/15 1430 02/21/15 0428 02/23/15 0515  WBC 16.3* 14.7* 11.8*  --   --  12.0* 11.3*  HGB 7.1* 7.5* 6.8* 6.8* 7.8* 7.4* 6.8*  HCT 21.8* 23.2* 20.7* 20.6* 22.8* 21.7* 19.7*  MCV 86.9 86.6 84.5  --   --  83.8 83.1  PLT 52* 62* 57*  --   --  78* 93*   Cardiac Enzymes: No results for input(s): CKTOTAL, CKMB, CKMBINDEX, TROPONINI in the last 168 hours. BNP: Invalid input(s): POCBNP CBG:  Recent Labs Lab 02/23/15 0003 02/23/15 0430 02/23/15 0812 02/23/15 1210 02/23/15 1728  GLUCAP 101* 106* 92 105* 108*    Recent Results (from the past 240 hour(s))  Culture, blood (routine x 2)     Status: None   Collection Time: 02/19/15  1:25 PM  Result Value Ref Range Status   Specimen Description BLOOD LEFT ARM  Final   Special Requests IN PEDIATRIC BOTTLE 1CC  Final   Culture  Setup Time   Final    GRAM POSITIVE COCCI IN CLUSTERS AEROBIC BOTTLE ONLY CRITICAL RESULT CALLED TO, READ BACK BY AND VERIFIED WITH: C. LITTLE,RN AT 0819 ON [id number] BY Lucienne Capers    Culture  Final    STAPHYLOCOCCUS SPECIES (COAGULASE NEGATIVE) THE SIGNIFICANCE OF ISOLATING THIS ORGANISM FROM A  SINGLE SET OF BLOOD CULTURES WHEN MULTIPLE SETS ARE DRAWN IS UNCERTAIN. PLEASE NOTIFY THE MICROBIOLOGY DEPARTMENT WITHIN ONE WEEK IF SPECIATION AND SENSITIVITIES ARE REQUIRED. Performed at [facility]    Report Status 02/22/2015 FINAL  Final  Culture, blood (routine x 2)     Status: None (Preliminary result)   Collection Time: 02/19/15  1:29 PM  Result Value Ref Range Status   Specimen Description BLOOD LEFT HAND  Final   Special Requests BOTTLES DRAWN AEROBIC ONLY 7CC  Final   Culture   Final    NO GROWTH 4 DAYS Performed at [facility]    Report Status PENDING  Incomplete     Scheduled Meds: . sodium chloride   Intravenous Once  . feeding supplement (ENSURE ENLIVE)  237 mL Oral BID BM  . furosemide  40 mg Oral Daily  . lactulose  10 g Oral TID  . pantoprazole  40 mg Oral BID  . piperacillin-tazobactam (ZOSYN)  IV  3.375 g Intravenous Q8H  . potassium chloride  40 mEq Oral Once  . rifaximin  550 mg Oral BID  . sodium chloride  10-40 mL Intracatheter Q12H  . thiamine  100 mg Oral Daily  . vancomycin  1,000 mg Intravenous Q24H   Continuous Infusions: . dextrose 5% lactated ringers 10 mL (02/20/15 2016)

## 2015-02-23 NOTE — Plan of Care (Signed)
Problem: Phase I Progression Outcomes Goal: Voiding-avoid urinary catheter unless indicated Outcome: Completed/Met Date Met:  02/23/15 Incont @ times.

## 2015-02-24 DIAGNOSIS — Z515 Encounter for palliative care: Secondary | ICD-10-CM

## 2015-02-24 DIAGNOSIS — I472 Ventricular tachycardia: Secondary | ICD-10-CM

## 2015-02-24 DIAGNOSIS — R531 Weakness: Secondary | ICD-10-CM

## 2015-02-24 DIAGNOSIS — A419 Sepsis, unspecified organism: Secondary | ICD-10-CM | POA: Diagnosis present

## 2015-02-24 DIAGNOSIS — I8500 Esophageal varices without bleeding: Secondary | ICD-10-CM | POA: Diagnosis present

## 2015-02-24 DIAGNOSIS — Z7189 Other specified counseling: Secondary | ICD-10-CM

## 2015-02-24 DIAGNOSIS — J9601 Acute respiratory failure with hypoxia: Secondary | ICD-10-CM

## 2015-02-24 DIAGNOSIS — I4729 Other ventricular tachycardia: Secondary | ICD-10-CM

## 2015-02-24 DIAGNOSIS — R6521 Severe sepsis with septic shock: Secondary | ICD-10-CM

## 2015-02-24 LAB — VANCOMYCIN, TROUGH: Vancomycin Tr: 15 ug/mL (ref 10.0–20.0)

## 2015-02-24 LAB — COMPREHENSIVE METABOLIC PANEL
ALBUMIN: 1.7 g/dL — AB (ref 3.5–5.0)
ALK PHOS: 63 U/L (ref 38–126)
ALT: 24 U/L (ref 17–63)
AST: 68 U/L — AB (ref 15–41)
Anion gap: 8 (ref 5–15)
BILIRUBIN TOTAL: 2.9 mg/dL — AB (ref 0.3–1.2)
CO2: 19 mmol/L — ABNORMAL LOW (ref 22–32)
Calcium: 7.5 mg/dL — ABNORMAL LOW (ref 8.9–10.3)
Chloride: 106 mmol/L (ref 101–111)
Creatinine, Ser: 0.93 mg/dL (ref 0.61–1.24)
GFR calc Af Amer: >60 mL/min (ref >60)
GFR calc non Af Amer: >60 mL/min (ref >60)
GLUCOSE: 98 mg/dL (ref 65–99)
POTASSIUM: 3.1 mmol/L — AB (ref 3.5–5.1)
Sodium: 133 mmol/L — ABNORMAL LOW (ref 135–145)
TOTAL PROTEIN: 6.4 g/dL — AB (ref 6.5–8.1)

## 2015-02-24 LAB — CULTURE, BLOOD (ROUTINE X 2): CULTURE: NO GROWTH

## 2015-02-24 LAB — CBC
HEMATOCRIT: 22.5 % — AB (ref 39.0–52.0)
Hemoglobin: 7.5 g/dL — ABNORMAL LOW (ref 13.0–17.0)
MCH: 27.5 pg (ref 26.0–34.0)
MCHC: 33.3 g/dL (ref 30.0–36.0)
MCV: 82.4 fL (ref 78.0–100.0)
Platelets: 87 10*3/uL — ABNORMAL LOW (ref 150–400)
RBC: 2.73 MIL/uL — ABNORMAL LOW (ref 4.22–5.81)
RDW: 16.2 % — AB (ref 11.5–15.5)
WBC: 9.6 10*3/uL (ref 4.0–10.5)

## 2015-02-24 LAB — TYPE AND SCREEN
ABO/RH(D): O POS
Antibody Screen: NEGATIVE
UNIT DIVISION: 0

## 2015-02-24 LAB — PROTIME-INR
INR: 3.11 — ABNORMAL HIGH (ref 0.00–1.49)
Prothrombin Time: 31.4 seconds — ABNORMAL HIGH (ref 11.6–15.2)

## 2015-02-24 LAB — FIBRINOGEN: Fibrinogen: <60 mg/dL — CL (ref 204–475)

## 2015-02-24 LAB — GLUCOSE, CAPILLARY
GLUCOSE-CAPILLARY: 91 mg/dL (ref 65–99)
GLUCOSE-CAPILLARY: 121 mg/dL — AB (ref 65–99)
GLUCOSE-CAPILLARY: 127 mg/dL — AB (ref 65–99)
GLUCOSE-CAPILLARY: 107 mg/dL — AB (ref 65–99)
Glucose-Capillary: 102 mg/dL — ABNORMAL HIGH (ref 65–99)

## 2015-02-24 LAB — APTT: APTT: 62 s — AB (ref 24–37)

## 2015-02-24 LAB — PHOSPHORUS: PHOSPHORUS: 3.1 mg/dL (ref 2.5–4.6)

## 2015-02-24 MED ORDER — OXYCODONE HCL 5 MG PO TABS
5.0000 mg | ORAL_TABLET | ORAL | Status: DC | PRN
Start: 2015-02-24 — End: 2015-02-26
  Administered 2015-02-24 – 2015-02-26 (×8): 5 mg via ORAL
  Filled 2015-02-24 (×8): qty 1

## 2015-02-24 MED ORDER — POTASSIUM CHLORIDE CRYS ER 20 MEQ PO TBCR
20.0000 meq | EXTENDED_RELEASE_TABLET | Freq: Three times a day (TID) | ORAL | Status: DC
  Administered 2015-02-24 – 2015-02-26 (×7): 20 meq via ORAL
  Filled 2015-02-24 (×7): qty 1

## 2015-02-24 MED ORDER — FUROSEMIDE 40 MG PO TABS
40.0000 mg | ORAL_TABLET | Freq: Two times a day (BID) | ORAL | Status: DC
  Administered 2015-02-24 – 2015-02-26 (×4): 40 mg via ORAL
  Filled 2015-02-24 (×4): qty 1

## 2015-02-24 NOTE — Progress Notes (Signed)
ANTIBIOTIC CONSULT NOTE - FOLLOW UP  Pharmacy Consult for vancomycin and zoyn Indication: pneumonia  No Known Allergies  Patient Measurements: Height: 5\' 10"  (177.8 cm) Weight: 239 lb 10.2 oz (108.7 kg) IBW/kg (Calculated) : 73   Vital Signs: Temp: 98.1 F (36.7 C) (08/24 0406) Temp Source: Oral (08/24 0406) BP: 97/59 mmHg (08/24 0406) Pulse Rate: 113 (08/24 0406) Intake/Output from previous day: 08/23 0701 - 08/24 0700 In: 1820 [P.O.:930; I.V.:170; Blood:370; IV Piggyback:350] Out: 235 [Urine:235] Intake/Output from this shift: Total I/O In: 250 [P.O.:240; I.V.:10] Out: -   Labs:  Recent Labs  02/23/15 0515 02/24/15 0450  WBC 11.3* 9.6  HGB 6.8* 7.5*  PLT 93* 87*  CREATININE 0.99 0.93   Estimated Creatinine Clearance: 116 mL/min (by C-G formula based on Cr of 0.93).  Recent Labs  02/24/15 1150  VANCOTROUGH 15     Microbiology: Recent Results (from the past 720 hour(s))  Blood culture (routine x 2)     Status: None   Collection Time: 02/06/15 11:41 AM  Result Value Ref Range Status   Specimen Description BLOOD LEFT HAND  Final   Special Requests IN PEDIATRIC BOTTLE 2CC  Final   Culture   Final    NO GROWTH 5 DAYS Performed at [facility]    Report Status 02/11/2015 FINAL  Final  MRSA PCR Screening     Status: None   Collection Time: 02/06/15  1:00 PM  Result Value Ref Range Status   MRSA by PCR NEGATIVE NEGATIVE Final    Comment:        The GeneXpert MRSA Assay (FDA approved for NASAL specimens only), is one component of a comprehensive MRSA colonization surveillance program. It is not intended to diagnose MRSA infection nor to guide or monitor treatment for MRSA infections.   Blood culture (routine x 2)     Status: None   Collection Time: 02/06/15  6:40 PM  Result Value Ref Range Status   Specimen Description BLOOD LEFT HAND  6 ML IN AEROBIC ONLY  Final   Special Requests NONE  Final   Culture   Final    NO GROWTH 5  DAYS Performed at [facility]    Report Status 02/12/2015 FINAL  Final  Culture, body fluid-bottle     Status: None   Collection Time: 02/07/15 12:46 PM  Result Value Ref Range Status   Specimen Description PERITONEAL  Final   Special Requests NONE  Final   Gram Stain   Final    GRAM POSITIVE COCCI IN CLUSTERS GRAM VARIABLE ROD ANAEROBIC BOTTLE ONLY CRITICAL RESULT CALLED TO, READ BACK BY AND VERIFIED WITH: R JOHNSON,RN AT 1446 02/08/15 BY L BENFIELD    Culture   Final    STAPHYLOCOCCUS SPECIES (COAGULASE NEGATIVE) THE SIGNIFICANCE OF ISOLATING THIS ORGANISM FROM A SINGLE SET OF BLOOD CULTURES WHEN MULTIPLE SETS ARE DRAWN IS UNCERTAIN. PLEASE NOTIFY THE MICROBIOLOGY DEPARTMENT WITHIN ONE WEEK IF SPECIATION AND  SENSITIVITIES ARE REQUIRED. BACILLUS SPECIES Standardized susceptibility testing for this organism is not available. Performed at [facility]    Report Status FINAL FINAL  Final  Gram stain     Status: None   Collection Time: 02/07/15 12:46 PM  Result Value Ref Range Status   Specimen Description PERITONEAL  Final   Special Requests NONE  Final   Gram Stain   Final    CYTOSPIN SLIDE WBC PRESENT,BOTH PMN AND MONONUCLEAR NO ORGANISMS SEEN Performed at [facility] [place]  Report Status 02/07/2015 FINAL  Final  Cath Tip Culture     Status: None   Collection Time: 02/10/15 10:44 PM  Result Value Ref Range Status   Specimen Description CATH TIP  Final   Special Requests NONE  Final   Culture   Final    NO GROWTH 2 DAYS Performed at [facility]    Report Status 02/13/2015 FINAL  Final  Culture, blood (routine x 2)     Status: None   Collection Time: 02/19/15  1:25 PM  Result Value Ref Range Status   Specimen Description BLOOD LEFT ARM  Final   Special Requests IN PEDIATRIC BOTTLE 1CC  Final   Culture  Setup Time   Final    GRAM POSITIVE COCCI IN CLUSTERS AEROBIC BOTTLE ONLY CRITICAL RESULT CALLED TO, READ BACK BY AND VERIFIED WITH: C.  LITTLE,RN AT 0819 ON [id number] BY Lucienne Capers    Culture   Final    STAPHYLOCOCCUS SPECIES (COAGULASE NEGATIVE) THE SIGNIFICANCE OF ISOLATING THIS ORGANISM FROM A SINGLE SET OF BLOOD CULTURES WHEN MULTIPLE SETS ARE DRAWN IS UNCERTAIN. PLEASE NOTIFY THE MICROBIOLOGY DEPARTMENT WITHIN ONE WEEK IF SPECIATION AND SENSITIVITIES ARE REQUIRED. Performed at [facility]    Report Status 02/22/2015 FINAL  Final  Culture, blood (routine x 2)     Status: None (Preliminary result)   Collection Time: 02/19/15  1:29 PM  Result Value Ref Range Status   Specimen Description BLOOD LEFT HAND  Final   Special Requests BOTTLES DRAWN AEROBIC ONLY 7CC  Final   Culture   Final    NO GROWTH 4 DAYS Performed at [facility]    Report Status PENDING  Incomplete    Anti-infectives    Start     Dose/Rate Route Frequency Ordered Stop   02/21/15 1200  vancomycin (VANCOCIN) IVPB 1000 mg/200 mL premix     1,000 mg 200 mL/hr over 60 Minutes Intravenous Every 24 hours 02/21/15 1019     02/20/15 1000  rifaximin (XIFAXAN) tablet 550 mg     550 mg Oral 2 times daily 02/20/15 0907     02/18/15 2300  vancomycin (VANCOCIN) IVPB 1000 mg/200 mL premix  Status:  Discontinued     1,000 mg 200 mL/hr over 60 Minutes Intravenous Every 12 hours 02/18/15 1101 02/20/15 2304   02/18/15 1130  vancomycin (VANCOCIN) 2,000 mg in sodium chloride 0.9 % 500 mL IVPB     2,000 mg 250 mL/hr over 120 Minutes Intravenous  Once 02/18/15 1101 02/18/15 1546   02/18/15 1130  piperacillin-tazobactam (ZOSYN) IVPB 3.375 g     3.375 g 12.5 mL/hr over 240 Minutes Intravenous Every 8 hours 02/18/15 1101     02/10/15 1000  cefTRIAXone (ROCEPHIN) 2 g in dextrose 5 % 50 mL IVPB  Status:  Discontinued     2 g 100 mL/hr over 30 Minutes Intravenous Every 24 hours 02/10/15 0810 02/18/15 1049   02/10/15 1000  vancomycin (VANCOCIN) IVPB 750 mg/150 ml premix  Status:  Discontinued     750 mg 150 mL/hr over 60 Minutes Intravenous Every 12 hours  02/10/15 0833 02/11/15 1124   02/09/15 1600  vancomycin (VANCOCIN) 1,250 mg in sodium chloride 0.9 % 250 mL IVPB  Status:  Discontinued     1,250 mg 166.7 mL/hr over 90 Minutes Intravenous Every 24 hours 02/08/15 1533 02/10/15 0833   02/09/15 1200  rifaximin (XIFAXAN) tablet 550 mg  Status:  Discontinued     550 mg Per Tube  2 times daily 02/09/15 1104 02/10/15 0856   02/08/15 1600  vancomycin (VANCOCIN) 2,000 mg in sodium chloride 0.9 % 500 mL IVPB     2,000 mg 250 mL/hr over 120 Minutes Intravenous NOW 02/08/15 1533 02/08/15 1816   02/06/15 1145  cefTRIAXone (ROCEPHIN) 1 g in dextrose 5 % 50 mL IVPB  Status:  Discontinued     1 g 100 mL/hr over 30 Minutes Intravenous Every 24 hours 02/06/15 1142 02/10/15 0810      Assessment: Patient's a 49 y.o M previously on ceftriaxone for SBP prophylaxis and then was switched to  vancomycin and zosyn for PNA.  Today, 02/24/2015: - day #7 of vancomycin and zosyn - afeb, wbc down wnl; scr stable (crcl~100 N) - echo negative for vegetation, cards said no indication for TEE, recheck echo in 4 wks  8/6 >> ceftriaxone >> (8/10 dose inc to 2gm)>>8/18 8/8 >> vancomycin >>  8/11>> resume 8/18 >> 8/18 >> zosyn>>  8/7 CXR w/ perihelar infiltrates > edema vs infection, rpt Xrays 8/15 & 16 no acute lung process 8/18 CXR: alveolar opacity in the right upper lobe consistent with pneumonia  8/6 bloodx2: neg FINAL 8/7 peritoneal fluid: CNS FINAL 8/10 cath tip: neg FINAL 8/19 blood x 2: GPC 1 of 2 - CNS  Dose changes/Drug levels: 8/20 2100 VT = _28__ on 1g q12 8/21 0600 VR = 21 (last vanc dose 1g 8/20 at 1001) - restart vanc at  q24h 8/24 1130 VT= 15 (on 1gm q24h)  Goal of Therapy:  Vancomycin trough level 15-20 mcg/ml  Plan:  - continue vancomycin 1gm IV 24h - continue zosyn 3.375 gm IV q8h (infuse over 4 hours) - consider de-escalating abx for PNA if appropriate  Arav Bannister P 02/24/2015,12:43 PM

## 2015-02-24 NOTE — Consult Note (Signed)
WOC wound consult note Reason for Consult:Partial thickness tissue loss on buttocks; yeast overgrowth in groin Wound type:Moisture (intertriginous dermatitis and incontinence associated dermatitis) Pressure Ulcer POA: No Measurement: Diffuse area of fungal overgrowth with no tissue loss in bilateral inguinal areas and also involving scrotum and intragluteal cleft. Additionally, there are scattered small partial thickness areas of tissue loss from incontinence associated dermatitis on the buttocks, the largest of which measures <2cm x 1.5cm x 0.2 Wound bed: pink, moist Drainage (amount, consistency, odor) scant serous Periwound: with confluent center and satellite lesions, periphery is peeling slightly Dressing procedure/placement/frequency: I have today provided a mattress replacement with low air loss feature to assist in the resolution of the moisture associated skin damage (ITD, IAD).  Patient is encouraged to turn and repsosition from side to side and avoid the supine position except for meals. Patient states that he enjoys sleeping on his stomach and also both sides.  The technology of the antimicrobial textile is taught, patient is in agreement with POC. WOC nursing team will not follow, but will remain available to this patient, the nursing and medical teams.  Please re-consult if needed. Thanks, Ladona Mow, MSN, RN, GNP, [place], CWON-AP [phone])

## 2015-02-24 NOTE — Progress Notes (Signed)
Progress Note   Lakai Smet WJX:[id number] DOB: [birth year] DOA: 02/06/2015 PCP: Pcp Not In System   Brief Narrative:   Wael Maestas is an 49 y.o. male with a PMH of cirrhosis and esophageal varices status post banding, recently discharged from a SNF after a prolonged hospitalization at an OSH, was admitted on 02/06/15 with acute GI bleeding, hypotension/shock, and hepatic encephalopathy requiring ventilator support and ICU care from 02/06/15-02/22/15 under the care of the critical care team.  Assessment/Plan:   Principal Problem:   Severe sepsis with septic shock and acute respiratory failure/hypoxia secondary to spontaneous bacterial peritonitis with ascitic cultures positive for bacillus/lactic acidosis - Admitted to the critical care team with elective intubation and initiation of vasopressin support. - Pressors weaned 02/16/15, extubated 02/18/15. - Source of sepsis likely from SBP with cultures growing bacillus from peritoneal cultures done 02/08/15. - Septic shock resolved with appropriate antibiotics including Rocephin followed by a course of vancomycin/Zosyn.  Active Problems:   Adynamic ileus - Appears to be resolved, eating.    Acute renal failure/chronic kidney disease, stage II - Creatinine normalized with restoration of blood pressure.    Hypokalemia/hypomagnesemia - Monitor and replace electrolytes as needed.    Acute upper GI bleed / peptic ulcers / history of esophageal varices status post banding - EGD done 02/06/15, no varices but small peptic ulcers seen.  - Continue PPI.    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   - Mental status clear.    Acute blood loss anemia / coagulopathy secondary to cirrhosis / thrombocytopenia secondary to cirrhosis - Secondary to acute GI bleeding. - Patient refuses blood transfusion despite drop in hemoglobin to 6.8.    Ascites - Status post 3.8 L paracentesis 02/07/15. - Status post repeat  paracentesis of 2 L 02/12/15. - Refused further paracentesis despite tense ascites.    Abnormal echocardiogram/grade 1 diastolic dysfunction/prolonged QTC/bradycardia - Evaluated by cardiology for evaluation of abnormality on echo concerning for vegetation. Findings consistent with artifact. - Follow-up 2-D echo in 4 weeks per cardiology recommendations. - Being monitored on telemetry.    NSVT (nonsustained ventricular tachycardia) - Monitor electrolytes.  OK to D/C tele.   Obesity  - Body mass index is 34.1 kg/(m^2).    DVT Prophylaxis - SCDs ordered.  Family Communication: Wife and sisters at the bedside. Disposition Plan: Likely will need SNF. Code Status:     Code Status Orders        Start     Ordered   02/06/15 1157  Full code   Continuous     02/06/15 1156        IV Access:    PICC Line placed 02/10/15  Left IJ CVC placed 02/06/15, removed 02/10/15   Procedures and diagnostic studies:   Dg Abd 1 View  02/21/2015   CLINICAL DATA:  Abdominal distention. On ventilator. Hepatic cirrhosis chronic renal failure.  EXAM: ABDOMEN - 1 VIEW  COMPARISON:  02/19/2015  FINDINGS: Multiple moderately dilated small bowel loops show no significant change. There is a relative paucity of colonic gas. Distal small bowel obstruction cannot be excluded.  IMPRESSION: No significant change in moderately dilated small bowel loops. Distal small bowel obstruction cannot be excluded.   Electronically Signed   By: Myles Rosenthal M.D.   On: 02/21/2015 11:56   Dg Abd 1 View  02/19/2015   CLINICAL DATA:  Abdominal distension  EXAM: ABDOMEN - 1 VIEW  COMPARISON:  02/17/2015  FINDINGS:  Mild small bowel dilatation is noted which is increased in the interval from the prior exam. Central crowding of the bowel markings is noted likely related to recurrent ascites. Some changes of anasarca are seen. No acute bony abnormality is noted.  IMPRESSION: Likely recurrent ascites.  New small bowel dilatation likely  representing a partial small bowel obstruction. Correlation with the physical exam is recommended.   Electronically Signed   By: Alcide Clever M.D.   On: 02/19/2015 12:55   Dg Abd 1 View  02/09/2015   CLINICAL DATA:  Constipated, abdominal distension  EXAM: ABDOMEN - 1 VIEW  COMPARISON:  02/08/2015  FINDINGS: The bowel gas pattern is normal. No radio-opaque calculi or other significant radiographic abnormality are seen.  IMPRESSION: Negative.   Electronically Signed   By: Elige Ko   On: 02/09/2015 10:41   Dg Abd 1 View  02/08/2015   CLINICAL DATA:  Orogastric tube placement.  EXAM: ABDOMEN - 1 VIEW  COMPARISON:  None.  FINDINGS: The bowel gas pattern is normal. Distal tip of feeding tube is seen in expected position of proximal stomach. No radio-opaque calculi or other significant radiographic abnormality are seen.  IMPRESSION: Distal tip of feeding tube seen in proximal stomach.   Electronically Signed   By: Lupita Raider, M.D.   On: 02/08/2015 13:34   Ct Head Wo Contrast  02/13/2015   CLINICAL DATA:  Acute encephalopathy  EXAM: CT HEAD WITHOUT CONTRAST  TECHNIQUE: Contiguous axial images were obtained from the base of the skull through the vertex without intravenous contrast.  COMPARISON:  None.  FINDINGS: The bony calvarium is intact. Mild atrophic changes are noted. No findings to suggest acute hemorrhage, acute infarction or space-occupying mass lesion are noted.  IMPRESSION: Mild atrophy.  No acute abnormality is noted.   Electronically Signed   By: Alcide Clever M.D.   On: 02/13/2015 15:39   US Renal  02/07/2015   CLINICAL DATA:  49 year old male with a history of acute renal failure, chronic renal failure, cirrhosis.  EXAM: RENAL / URINARY TRACT ULTRASOUND COMPLETE  COMPARISON:  None.  FINDINGS: Right Kidney:  Length: 12.3 cm. No evidence of right-sided hydronephrosis. Echogenicity similar to that of the visualized adjacent liver parenchyma. Flow confirmed in the hilum of the right kidney.  Left  Kidney:  Length: 12.0 cm. Left kidney incompletely visualized, however, there does not appear to be significant hydronephrosis.  Moderate to large volume of ascites.  IMPRESSION: No evidence of left or right hydronephrosis to indicate obstruction.  Moderate to large volume of ascites.  Signed,  Yvone Neu. Loreta Ave, DO  [facility]  [facility]   Electronically Signed   By: Gilmer Mor D.O.   On: 02/07/2015 12:46   US Paracentesis  02/07/2015   CLINICAL DATA:  Abdominal distention. Alcoholic cirrhosis. Massive ascites. Request diagnostic and therapeutic paracentesis of up to 4 L max.  EXAM: ULTRASOUND GUIDED PARACENTESIS  COMPARISON:  None.  PROCEDURE: An ultrasound guided paracentesis was thoroughly discussed with the patient and questions answered. The benefits, risks, alternatives and complications were also discussed. The patient understands and wishes to proceed with the procedure. Written consent was obtained.  Ultrasound was performed to localize and mark an adequate pocket of fluid in the left lower quadrant of the abdomen. The area was then prepped and draped in the normal sterile fashion. 1% Lidocaine was used for local anesthesia. Under ultrasound guidance a 19 gauge Yueh catheter was introduced. Paracentesis was performed.  The catheter was removed and a dressing applied.  COMPLICATIONS: None immediate  FINDINGS: A total of approximately 3.8 L of cloudy, yellow fluid was removed. A fluid sample was sent for laboratory analysis.  IMPRESSION: Successful ultrasound guided paracentesis yielding 3.8 L of ascites.  Read by: Brayton El PA-C   Electronically Signed   By: Gilmer Mor D.O.   On: 02/07/2015 12:08   Dg Chest Port 1 View  02/20/2015   CLINICAL DATA:  Congestive failure  EXAM: PORTABLE CHEST - 1 VIEW  COMPARISON:  02/19/2015  FINDINGS: Cardiac shadow is stable. Right-sided PICC line is again identified and stable. Patchy opacities are again  identified throughout both lungs. No sizable effusion or pneumothorax is noted.  IMPRESSION: Overall stable appearance of the chest when given some technical variations.   Electronically Signed   By: Alcide Clever M.D.   On: 02/20/2015 07:15   Dg Chest Port 1 View  02/19/2015   CLINICAL DATA:  Respiratory failure  EXAM: PORTABLE CHEST - 1 VIEW  COMPARISON:  02/18/2015  FINDINGS: Progressive interstitial and airspace opacity in the bilateral lungs, asymmetric to the right. No pleural effusion. Normal heart size for technique.  Stable right upper extremity PICC, tip at the upper right atrium.  IMPRESSION: Progressive bilateral lung opacity. Edema or pneumonia could have this appearance.   Electronically Signed   By: Marnee Spring M.D.   On: 02/19/2015 07:02   Dg Chest Port 1 View  02/18/2015   CLINICAL DATA:  Respiratory failure, cirrhosis, encephalopathy.  EXAM: PORTABLE CHEST - 1 VIEW  COMPARISON:  Portable chest x-ray of February 17, 2015  FINDINGS: The lungs are slightly less well inflated today. Confluent alveolar opacity is more conspicuous in the right upper lobe. The interstitial markings elsewhere in both lungs remain mildly increased. The cardiac silhouette is top-normal in size. The pulmonary vascularity is not engorged. The PICC line tip projects over the junction of the middle and distal SVC.  IMPRESSION: Slight interval increase in conspicuity of alveolar opacity in the right upper lobe consistent with pneumonia. Mild interstitial edema is stable.   Electronically Signed   By: David  [place] M.D.   On: 02/18/2015 07:06   Dg Chest Port 1 View  02/17/2015   CLINICAL DATA:  Respiratory failure .  EXAM: PORTABLE CHEST - 1 VIEW  COMPARISON:  02/16/2015 .  FINDINGS: Interim extubation and removal of NG tube. Right PICC line stable position. Mediastinum and hilar structures normal. Cardiomegaly with mild pulmonary vascular prominence and interstitial prominence consistent mild congestive heart failure .  No pleural effusion or pneumothorax.  IMPRESSION: 1. Interim removal of endotracheal tube and NG tube. Right PICC line in stable position. 2. Cardiomegaly with persistent diffuse interstitial prominence consistent with congestive heart failure. Pneumonitis cannot be excluded.   Electronically Signed   By: Maisie Fus  Register   On: 02/17/2015 07:07   Dg Chest Port 1 View  02/16/2015   CLINICAL DATA:  Hypoxia  EXAM: PORTABLE CHEST - 1 VIEW  COMPARISON:  February 15, 2015  FINDINGS: Endotracheal tube tip is 3.2 cm above the carina. Central catheter tip is in the right atrium. Nasogastric tube tip and side port are below the diaphragm. No pneumothorax.  There is mild interstitial edema. There is no airspace consolidation. Heart is normal in size and contour. Pulmonary vascularity are normal. No adenopathy. There is a probable bone island in the proximal right humerus, stable. There is arthropathy in both shoulders.  IMPRESSION: Tube and catheter positions  as described without pneumothorax. Mild generalized interstitial edema without airspace consolidation. No change in cardiac silhouette.   Electronically Signed   By: Bretta Bang III M.D.   On: 02/16/2015 07:10   Dg Chest Port 1 View  02/15/2015   CLINICAL DATA:  Cirrhosis, varices and gastrointestinal bleeding. Intubated patient.  EXAM: PORTABLE CHEST - 1 VIEW  COMPARISON:  Single view of the chest 02/14/2015 and 02/11/2015.  FINDINGS: Support tubes and lines are unchanged. Lung volumes are low but the lungs are clear. Heart size is normal. No pneumothorax or pleural effusion.  IMPRESSION: No change in support apparatus.  No acute disease.   Electronically Signed   By: Drusilla Kanner M.D.   On: 02/15/2015 15:15   Dg Chest Port 1 View  02/14/2015   CLINICAL DATA:  Acute encephalopathy  EXAM: PORTABLE CHEST - 1 VIEW  COMPARISON:  02/11/2015  FINDINGS: Cardiomediastinal silhouette is stable. Again noted NG tube and endotracheal tube unchanged in position. No  acute infiltrate or pulmonary edema. Hypoinflation again noted.  IMPRESSION: No active disease. Stable support apparatus. Hypoinflation again noted.   Electronically Signed   By: Natasha Mead M.D.   On: 02/14/2015 09:04   Dg Chest Port 1 View  02/11/2015   CLINICAL DATA:  Respiratory failure, intubated patient.  EXAM: PORTABLE CHEST - 1 VIEW  COMPARISON:  Portable chest x-ray of February 09, 2015  FINDINGS: The lungs remain hypoinflated. The interstitial markings remain diffusely increased but have improved slightly. The cardiac silhouette is mildly enlarged though stable. The central pulmonary vascularity prominent. The endotracheal tube tip lies 4.5 cm above the carina. The esophagogastric tube tip projects in the gastric cardia and is stable. There are degenerative changes of both shoulders.  IMPRESSION: Persistent bilateral hypoinflation. Slight interval improvement in the appearance of the pulmonary interstitium. There is no alveolar pneumonia. Support tubes are in reasonable position.   Electronically Signed   By: David  [place] M.D.   On: 02/11/2015 07:37   Dg Chest Port 1 View  02/09/2015   CLINICAL DATA:  Central line repositioning  EXAM: PORTABLE CHEST - 1 VIEW  COMPARISON:  February 07, 2015  FINDINGS: Central catheter tip is at the junction of the left innominate vein and superior vena cava. Endotracheal tube tip is 2.5 cm above carina. Nasogastric tube tip and side port are in the stomach. No pneumothorax. There is no edema or consolidation. Heart is upper normal in size with pulmonary vascularity within normal limits. No adenopathy. There is an apparent Hill-Sachs defect in the left humeral head. There is arthropathy in each shoulder.  IMPRESSION: Tube and catheter positions as described without pneumothorax. No edema or consolidation.   Electronically Signed   By: Bretta Bang III M.D.   On: 02/09/2015 02:41   Dg Chest Port 1 View  02/07/2015   CLINICAL DATA:  Withdrawal of central line  EXAM:  PORTABLE CHEST - 1 VIEW  COMPARISON:  Portable exam 1348 hours compared to 02/06/2015  FINDINGS: LEFT jugular central venous catheter with tip projecting over cavoatrial junction.  Minimal enlargement of cardiac silhouette.  Pulmonary vascular congestion.  Perihilar infiltrates question edema versus infection.  Low lung volumes.  No pleural effusion or pneumothorax.  Tip of endotracheal tube at carina, recommend withdrawal 2 cm.  IMPRESSION: Tip of LEFT jugular 9 not projects over cavoatrial junction.  Tip of endotracheal tube now at carina, recommend withdrawal 2 cm.  Perihilar infiltrates question edema versus infection.  Critical Value/emergent results were called  by telephone at the time of interpretation on 02/07/2015 at 1403 hr to Onalee Hua RN (in ICU Stepdown), who verbally acknowledged these results.   Electronically Signed   By: Ulyses Southward M.D.   On: 02/07/2015 14:04   Dg Chest Port 1 View  02/06/2015   CLINICAL DATA:  Patient with stroke status post line placement.  EXAM: PORTABLE CHEST - 1 VIEW  COMPARISON:  Earlier same date  FINDINGS: Interval insertion of endotracheal tube terminating 15 mm superior to the carina. New left IJ central venous catheter tip projects over the right atrium. Stable cardiac and mediastinal contours. Low lung volumes. Unchanged diffuse bilateral coarse heterogeneous opacities. No pleural effusion or pneumothorax.  IMPRESSION: New left IJ central venous catheter tip projects over the right atrium, consider retraction.  ET tube terminates in the distal trachea.  Unchanged heterogeneous opacities bilaterally potentially secondary to bronchitis.  These results were called by telephone at the time of interpretation on 02/06/2015 at 4:37 pm to Dr. Lawanda Cousins , who verbally acknowledged these results.   Electronically Signed   By: Annia Belt M.D.   On: 02/06/2015 16:38   Dg Chest Port 1 View  02/06/2015   CLINICAL DATA:  49 year old male with shortness breath and chest pain for the  past 12 hr. Vomiting.  EXAM: PORTABLE CHEST - 1 VIEW  COMPARISON:  No priors.  FINDINGS: Lung volumes are low. Severe diffuse peribronchial cuffing. No consolidative airspace disease. No pleural effusions. No evidence of pulmonary edema. Heart size is normal. Mediastinal contours are unremarkable.  IMPRESSION: 1. Severe diffuse peribronchial cuffing, concerning for severe acute bronchitis.   Electronically Signed   By: Trudie Reed M.D.   On: 02/06/2015 10:32   Dg Chest Port 1v Same Day  02/06/2015   CLINICAL DATA:  Hypoxia. Requested per MD to assess whether radiographic distortion affects prior imaging of IJ line and ETT  EXAM: PORTABLE CHEST - 1 VIEW SAME DAY  COMPARISON:  02/06/2015 at 1604 hours  FINDINGS: Left internal jugular central venous line catheter tip again projects in the right atrium.  Endotracheal tube tip projects 1 cm above the carina. This is also stable.  Lung volumes are low. There is interstitial prominence but no areas of lung consolidation and no overt pulmonary edema. No pneumothorax.  IMPRESSION: 1. No change from the earlier study. 2. Left internal jugular central venous line tip again projects in the right atrium. Endotracheal tube tip projects 1 cm above the carina.   Electronically Signed   By: Amie Portland M.D.   On: 02/06/2015 18:00   Dg Abd Portable 1v  02/21/2015   CLINICAL DATA:  Status post nasogastric tube placement.  EXAM: PORTABLE ABDOMEN - 1 VIEW  COMPARISON:  02/21/2015 at 11:13 a.m.  FINDINGS: Nasogastric tube tip projects in the distal stomach, well positioned.  Air-filled prominent small bowel is again noted, unchanged from the earlier study.  IMPRESSION: Nasogastric tube tip well-positioned within the distal stomach.   Electronically Signed   By: Amie Portland M.D.   On: 02/21/2015 15:06   Dg Abd Portable 1v  02/17/2015   CLINICAL DATA:  Abdominal distention  EXAM: PORTABLE ABDOMEN - 1 VIEW  COMPARISON:  February 13, 2015  FINDINGS: There is no demonstrable  bowel dilatation. No air-fluid levels are seen on this supine examination. No free air appreciable. No abnormal calcifications.  IMPRESSION: Bowel gas pattern overall unremarkable.   Electronically Signed   By: Bretta Bang III M.D.   On: 02/17/2015  11:16   Dg Abd Portable 1v  02/13/2015   CLINICAL DATA:  Abdominal distension.  EXAM: PORTABLE ABDOMEN - 1 VIEW  COMPARISON:  February 12, 2015.  FINDINGS: Stable dilated small bowel loops are seen in the central and right side of the abdomen most consistent with ileus. No significant colonic dilatation is noted. No abnormal calcifications are noted.  IMPRESSION: Stable dilated small bowel loops are noted most consistent with ileus, but continued radiographic follow-up is recommended to rule out obstruction.   Electronically Signed   By: Lupita Raider, M.D.   On: 02/13/2015 12:07   Dg Abd Portable 1v  02/12/2015   CLINICAL DATA:  Ascites.  Ileus.  EXAM: PORTABLE ABDOMEN - 1 VIEW  COMPARISON:  02/09/2015 .  02/08/2015.  FINDINGS: NG tube noted coiled in stomach. Dilated loops of small large bowel noted consistent with adynamic ileus. Follow-up abdominal series suggested to demonstrate clearing and to exclude bowel obstruction. No free air. Probable ascites. No acute bony abnormality.  IMPRESSION: 1.  NG tube noted in good anatomic position.  2. Distended loops of small and large bowel consistent with adynamic ileus. Follow-up abdominal series suggested to demonstrate clearing and to exclude bowel obstruction.  3.  Probable ascites.   Electronically Signed   By: Maisie Fus  Register   On: 02/12/2015 07:10     Medical Consultants:    Palliative Care: Canary Brim, NP  Cardiology: Wendall Stade, MD  Gastroenterology: Rachael Fee, MD  PCCM: Roslynn Amble, MD  Anti-Infectives:    Rocephin, start date 8/6 >> 8/18  Vancomycin, 8/8 >> 8/11 restart 8/18  Pip-tazo 8/18 >>  Subjective:   Christine Hamme is tearful.  He has family present, and  seems to be processing the fact that he has a life limiting illness, and understands that he may not have many days left.  He complains of RLQ abdominal pain.  No N/V.    Objective:    Filed Vitals:   02/23/15 1335 02/23/15 1855 02/23/15 2018 02/24/15 0406  BP: 113/74 100/70 107/79 97/59  Pulse: 113 115 115 113  Temp: 98.2 F (36.8 C)  98.3 F (36.8 C) 98.1 F (36.7 C)  TempSrc: Oral  Oral Oral  Resp: 18 16 20 20   Height:      Weight:    108.7 kg (239 lb 10.2 oz)  SpO2: 100% 100% 100% 99%    Intake/Output Summary (Last 24 hours) at 02/24/15 0851 Last data filed at 02/24/15 0700  Gross per 24 hour  Intake   1820 ml  Output    235 ml  Net   1585 ml    Exam: Gen/Psych:  Tearful/depressed affect Cardiovascular:  Tachy, No M/R/G Respiratory:  Lungs diminished Gastrointestinal:  Abdomen tense, distended, + BS Extremities:  3+ edema   Data Reviewed:    Labs: Basic Metabolic Panel:  Recent Labs Lab 02/19/15 0630 02/20/15 0415 02/21/15 0428 02/22/15 0500 02/23/15 0515 02/24/15 0450  NA 135 134* 133*  --  133* 133*  K 3.3* 2.9* 3.4*  --  2.9* 3.1*  CL 110 109 109  --  106 106  CO2 20* 20* 19*  --  21* 19*  GLUCOSE 96 84 117*  --  118* 98  BUN 5* <5* 5*  --  6 <5*  CREATININE 0.88 0.92 0.97  --  0.99 0.93  CALCIUM 7.4* 7.3* 7.3*  --  7.4* 7.5*  MG 1.7 1.5* 1.5* 1.8 1.5*  --   PHOS  2.4* 2.7 2.1* 3.0 2.8 3.1   GFR Estimated Creatinine Clearance: 116 mL/min (by C-G formula based on Cr of 0.93). Liver Function Tests:  Recent Labs Lab 02/21/15 0428 02/24/15 0450  AST 90* 68*  ALT 28 24  ALKPHOS 83 63  BILITOT 2.2* 2.9*  PROT 6.3* 6.4*  ALBUMIN 1.7* 1.7*   Coagulation profile  Recent Labs Lab 02/20/15 0415 02/21/15 0428 02/22/15 0500 02/23/15 0515 02/24/15 0450  INR 3.86* 3.37* 3.29* 3.20* 3.11*    CBC:  Recent Labs Lab 02/18/15 0550 02/20/15 0415 02/20/15 0538 02/20/15 1430 02/21/15 0428 02/23/15 0515 02/24/15 0450  WBC 14.7* 11.8*   --   --  12.0* 11.3* 9.6  HGB 7.5* 6.8* 6.8* 7.8* 7.4* 6.8* 7.5*  HCT 23.2* 20.7* 20.6* 22.8* 21.7* 19.7* 22.5*  MCV 86.6 84.5  --   --  83.8 83.1 82.4  PLT 62* 57*  --   --  78* 93* 87*   CBG:  Recent Labs Lab 02/23/15 1728 02/23/15 2013 02/24/15 0001 02/24/15 0403 02/24/15 0810  GLUCAP 108* 105* 102* 91 121*   Sepsis Labs:  Recent Labs Lab 02/19/15 0630 02/19/15 1329 02/20/15 0415 02/21/15 0428 02/23/15 0515 02/24/15 0450  PROCALCITON 0.16 0.11 0.10 <0.10  --   --   WBC  --   --  11.8* 12.0* 11.3* 9.6   Microbiology Recent Results (from the past 240 hour(s))  Culture, blood (routine x 2)     Status: None   Collection Time: 02/19/15  1:25 PM  Result Value Ref Range Status   Specimen Description BLOOD LEFT ARM  Final   Special Requests IN PEDIATRIC BOTTLE 1CC  Final   Culture  Setup Time   Final    GRAM POSITIVE COCCI IN CLUSTERS AEROBIC BOTTLE ONLY CRITICAL RESULT CALLED TO, READ BACK BY AND VERIFIED WITH: C. LITTLE,RN AT 0819 ON [id number] BY Lucienne Capers    Culture   Final    STAPHYLOCOCCUS SPECIES (COAGULASE NEGATIVE) THE SIGNIFICANCE OF ISOLATING THIS ORGANISM FROM A SINGLE SET OF BLOOD CULTURES WHEN MULTIPLE SETS ARE DRAWN IS UNCERTAIN. PLEASE NOTIFY THE MICROBIOLOGY DEPARTMENT WITHIN ONE WEEK IF SPECIATION AND SENSITIVITIES ARE REQUIRED. Performed at [facility]    Report Status 02/22/2015 FINAL  Final  Culture, blood (routine x 2)     Status: None (Preliminary result)   Collection Time: 02/19/15  1:29 PM  Result Value Ref Range Status   Specimen Description BLOOD LEFT HAND  Final   Special Requests BOTTLES DRAWN AEROBIC ONLY 7CC  Final   Culture   Final    NO GROWTH 4 DAYS Performed at [facility]    Report Status PENDING  Incomplete     Medications:   . sodium chloride   Intravenous Once  . feeding supplement (ENSURE ENLIVE)  237 mL Oral BID BM  . furosemide  40 mg Oral Daily  . lactulose  10 g Oral TID  . pantoprazole  40 mg  Oral BID  . piperacillin-tazobactam (ZOSYN)  IV  3.375 g Intravenous Q8H  . potassium chloride  40 mEq Oral Once  . rifaximin  550 mg Oral BID  . sodium chloride  10-40 mL Intracatheter Q12H  . thiamine  100 mg Oral Daily  . vancomycin  1,000 mg Intravenous Q24H   Continuous Infusions:   Time spent: 35 minutes with > 50% of time discussing current diagnostic test results, clinical impression and plan of care with the patient and his family.   LOS: 18  days   [name],CHRISTINA  [facility] Pager [phone]. If unable to reach me by pager, please call my cell phone at [phone].  *Please refer to [email or web address], password [email or web address] to get updated schedule on who will round on this patient, as hospitalists switch teams weekly. If 7PM-7AM, please contact night-coverage at [email or web address], password [email or web address] for any overnight needs.  02/24/2015, 8:51 AM

## 2015-02-24 NOTE — Evaluation (Signed)
Physical Therapy Evaluation Patient Details Name: Francisco [name] MRN: [id number] DOB: 1966/02/20 Today's Date: 02/24/2015   History of Present Illness  49 yo male admitted with GI bleed, VDRF, acute encephalopathy, anemia. Intubated 8/6, extubated 8/16, fall during hospital stay. Hx of cirrhosis, liver failure, renal failure  Clinical Impression  On eval, pt required Mod assist for bed mobility and Min assist for standing, ambulation. Pt walked ~100 feet with RW. Discussed d/c plan-feel pt would benefit from ST rehab at SNF to maximize independence and safety with functional mobility.    Follow Up Recommendations SNF    Equipment Recommendations  Rolling walker with 5" wheels    Recommendations for Other Services       Precautions / Restrictions Precautions Precautions: Fall Restrictions Weight Bearing Restrictions: No      Mobility  Bed Mobility Overal bed mobility: Needs Assistance Bed Mobility: Supine to Sit;Sit to Supine     Supine to sit: Mod assist;HOB elevated Sit to supine: Mod assist;HOB elevated   General bed mobility comments: Assist for trunk and bil LEs. Increased time. VCs safety, technique.   Transfers Overall transfer level: Needs assistance Equipment used: Rolling walker (2 wheeled) Transfers: Sit to/from Stand Sit to Stand: Mod assist         General transfer comment: Assist to rise, stabilize, control descent. VCs safety, technique, hand placement  Ambulation/Gait Ambulation/Gait assistance: Min assist Ambulation Distance (Feet): 100 Feet Assistive device: Rolling walker (2 wheeled) Gait Pattern/deviations: Step-through pattern;Decreased stride length     General Gait Details: Assist to stabilize throughout ambulation and to maneuver with RW. VCs safety.   Stairs            Wheelchair Mobility    Modified Rankin (Stroke Patients Only)       Balance Overall balance assessment: Needs assistance         Standing balance  support: Bilateral upper extremity supported;During functional activity Standing balance-Leahy Scale: Poor                               Pertinent Vitals/Pain Pain Assessment: 0-10 Pain Score: 2  Pain Location: R side abdomen Pain Descriptors / Indicators: Sore Pain Intervention(s): Monitored during session    Home Living Family/patient expects to be discharged to:: Private residence Living Arrangements: Spouse/significant other   Type of Home: Apartment         Home Equipment: Cane - single point Additional Comments: info for living arrangements in [place](where pt is from/where wife is). Per wife, will likely need to communicate with pt's mother about living arrangements here in [place].    Prior Function           Comments: completed rehab recently. Pt stated he had a cane at home     Hand Dominance        Extremity/Trunk Assessment   Upper Extremity Assessment: Generalized weakness           Lower Extremity Assessment: Generalized weakness;RLE deficits/detail;LLE deficits/detail RLE Deficits / Details: edematous LLE Deficits / Details: edematous  Cervical / Trunk Assessment: Other exceptions  Communication   Communication: No difficulties  Cognition Arousal/Alertness: Awake/alert Behavior During Therapy: WFL for tasks assessed/performed Overall Cognitive Status: Within Functional Limits for tasks assessed                      General Comments      Exercises        Assessment/Plan  PT Assessment Patient needs continued PT services  PT Diagnosis Difficulty walking;Generalized weakness   PT Problem List Decreased strength;Decreased mobility;Decreased balance;Decreased activity tolerance;Decreased knowledge of use of DME  PT Treatment Interventions DME instruction;Gait training;Functional mobility training;Therapeutic activities;Patient/family education;Balance training;Therapeutic exercise   PT Goals (Current goals  can be found in the Care Plan section) Acute Rehab PT Goals Patient Stated Goal: pt would like to go home PT Goal Formulation: With patient Time For Goal Achievement: 03/10/15 Potential to Achieve Goals: Good    Frequency Min 3X/week   Barriers to discharge        Co-evaluation               End of Session Equipment Utilized During Treatment: Gait belt Activity Tolerance: Patient tolerated treatment well Patient left: in bed;with call bell/phone within reach;with bed alarm set           Time: 1610-9604 PT Time Calculation (min) (ACUTE ONLY): 30 min   Charges:   PT Evaluation $Initial PT Evaluation Tier I: 1 Procedure PT Treatments $Gait Training: 8-22 mins   PT G Codes:        Rebeca Alert, MPT Pager: [phone]

## 2015-02-25 DIAGNOSIS — G9340 Encephalopathy, unspecified: Secondary | ICD-10-CM

## 2015-02-25 DIAGNOSIS — I8500 Esophageal varices without bleeding: Secondary | ICD-10-CM

## 2015-02-25 LAB — BASIC METABOLIC PANEL
Anion gap: 3 — ABNORMAL LOW (ref 5–15)
CHLORIDE: 109 mmol/L (ref 101–111)
CO2: 22 mmol/L (ref 22–32)
CREATININE: 0.98 mg/dL (ref 0.61–1.24)
Calcium: 7.5 mg/dL — ABNORMAL LOW (ref 8.9–10.3)
GFR calc Af Amer: >60 mL/min (ref >60)
GFR calc non Af Amer: >60 mL/min (ref >60)
GLUCOSE: 107 mg/dL — AB (ref 65–99)
Potassium: 3.1 mmol/L — ABNORMAL LOW (ref 3.5–5.1)
SODIUM: 134 mmol/L — AB (ref 135–145)

## 2015-02-25 LAB — CBC
HEMATOCRIT: 23.2 % — AB (ref 39.0–52.0)
HEMOGLOBIN: 8.1 g/dL — AB (ref 13.0–17.0)
MCH: 28.8 pg (ref 26.0–34.0)
MCHC: 34.9 g/dL (ref 30.0–36.0)
MCV: 82.6 fL (ref 78.0–100.0)
Platelets: 105 10*3/uL — ABNORMAL LOW (ref 150–400)
RBC: 2.81 MIL/uL — ABNORMAL LOW (ref 4.22–5.81)
RDW: 16.4 % — AB (ref 11.5–15.5)
WBC: 9.3 10*3/uL (ref 4.0–10.5)

## 2015-02-25 LAB — MAGNESIUM: MAGNESIUM: 1.4 mg/dL — AB (ref 1.7–2.4)

## 2015-02-25 MED ORDER — POTASSIUM CHLORIDE CRYS ER 20 MEQ PO TBCR
40.0000 meq | EXTENDED_RELEASE_TABLET | Freq: Once | ORAL | Status: AC
  Administered 2015-02-25: 40 meq via ORAL
  Filled 2015-02-25: qty 2

## 2015-02-25 MED ORDER — SODIUM CHLORIDE 0.9 % IV SOLN: INTRAVENOUS | Status: DC

## 2015-02-25 MED ORDER — MAGNESIUM OXIDE 400 (241.3 MG) MG PO TABS
400.0000 mg | ORAL_TABLET | Freq: Two times a day (BID) | ORAL | Status: DC
  Administered 2015-02-25 – 2015-02-26 (×2): 400 mg via ORAL
  Filled 2015-02-25 (×2): qty 1

## 2015-02-25 NOTE — Progress Notes (Signed)
Progress Note   Geoge Deridder WUJ:[id number] DOB: 1966/03/02 DOA: 02/06/2015 PCP: Pcp Not In System   Brief Narrative:   Levante Simones is an 49 y.o. male with a PMH of cirrhosis and esophageal varices status post banding, recently discharged from a SNF after a prolonged hospitalization at an OSH, was admitted on 02/06/15 with acute GI bleeding, hypotension/shock, and hepatic encephalopathy requiring ventilator support and ICU care from 02/06/15-02/22/15 under the care of the critical care team.  Assessment/Plan:   Principal Problem:   Severe sepsis with septic shock and acute respiratory failure/hypoxia secondary to spontaneous bacterial peritonitis with ascitic cultures positive for bacillus/lactic acidosis - Admitted to the critical care team with elective intubation and initiation of vasopressin support. - Pressors weaned 02/16/15, extubated 02/18/15. - Source of sepsis likely from SBP with cultures growing bacillus from peritoneal cultures done 02/08/15. - Septic shock resolved with appropriate antibiotics including Rocephin followed by a course of vancomycin/Zosyn.  Active Problems:   Adynamic ileus - Appears to be resolved, eating.    Acute renal failure/chronic kidney disease, stage II - Creatinine normalized with restoration of blood pressure.    Hypokalemia/hypomagnesemia - Monitor and replace electrolytes as needed.    Acute upper GI bleed / peptic ulcers / history of esophageal varices status post banding - EGD done 02/06/15, no varices but small peptic ulcers seen.  - Continue PPI.    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   - Mental status clear.    Acute blood loss anemia / coagulopathy secondary to cirrhosis / thrombocytopenia secondary to cirrhosis - Secondary to acute GI bleeding. - Patient refuses blood transfusion despite drop in hemoglobin to 6.8. Hemoglobin 7.5 today.    Ascites - Status post 3.8 L paracentesis  02/07/15. - Status post repeat paracentesis of 2 L 02/12/15. - Refuses further paracentesis despite tense ascites.    Abnormal echocardiogram/grade 1 diastolic dysfunction/prolonged QTC/bradycardia - Evaluated by cardiology for evaluation of abnormality on echo concerning for vegetation. Findings consistent with artifact. - Follow-up 2-D echo in 4 weeks per cardiology recommendations.    NSVT (nonsustained ventricular tachycardia) - Monitor electrolytes.  OK to D/C tele.   Obesity  - Body mass index is 34.1 kg/(m^2).    DVT Prophylaxis - SCDs ordered.  Family Communication: Mother by telephone. Disposition Plan: Likely will need SNF. Code Status:     Code Status Orders        Start     Ordered   02/06/15 1157  Full code   Continuous     02/06/15 1156        IV Access:    PICC Line placed 02/10/15  Left IJ CVC placed 02/06/15, removed 02/10/15   Procedures and diagnostic studies:   Dg Abd 1 View  02/21/2015   CLINICAL DATA:  Abdominal distention. On ventilator. Hepatic cirrhosis chronic renal failure.  EXAM: ABDOMEN - 1 VIEW  COMPARISON:  02/19/2015  FINDINGS: Multiple moderately dilated small bowel loops show no significant change. There is a relative paucity of colonic gas. Distal small bowel obstruction cannot be excluded.  IMPRESSION: No significant change in moderately dilated small bowel loops. Distal small bowel obstruction cannot be excluded.   Electronically Signed   By: Myles Rosenthal M.D.   On: 02/21/2015 11:56   Dg Abd 1 View  02/19/2015   CLINICAL DATA:  Abdominal distension  EXAM: ABDOMEN - 1 VIEW  COMPARISON:  02/17/2015  FINDINGS: Mild small bowel dilatation is  noted which is increased in the interval from the prior exam. Central crowding of the bowel markings is noted likely related to recurrent ascites. Some changes of anasarca are seen. No acute bony abnormality is noted.  IMPRESSION: Likely recurrent ascites.  New small bowel dilatation likely representing a  partial small bowel obstruction. Correlation with the physical exam is recommended.   Electronically Signed   By: Alcide Clever M.D.   On: 02/19/2015 12:55   Dg Abd 1 View  02/09/2015   CLINICAL DATA:  Constipated, abdominal distension  EXAM: ABDOMEN - 1 VIEW  COMPARISON:  02/08/2015  FINDINGS: The bowel gas pattern is normal. No radio-opaque calculi or other significant radiographic abnormality are seen.  IMPRESSION: Negative.   Electronically Signed   By: Elige Ko   On: 02/09/2015 10:41   Dg Abd 1 View  02/08/2015   CLINICAL DATA:  Orogastric tube placement.  EXAM: ABDOMEN - 1 VIEW  COMPARISON:  None.  FINDINGS: The bowel gas pattern is normal. Distal tip of feeding tube is seen in expected position of proximal stomach. No radio-opaque calculi or other significant radiographic abnormality are seen.  IMPRESSION: Distal tip of feeding tube seen in proximal stomach.   Electronically Signed   By: Lupita Raider, M.D.   On: 02/08/2015 13:34   Ct Head Wo Contrast  02/13/2015   CLINICAL DATA:  Acute encephalopathy  EXAM: CT HEAD WITHOUT CONTRAST  TECHNIQUE: Contiguous axial images were obtained from the base of the skull through the vertex without intravenous contrast.  COMPARISON:  None.  FINDINGS: The bony calvarium is intact. Mild atrophic changes are noted. No findings to suggest acute hemorrhage, acute infarction or space-occupying mass lesion are noted.  IMPRESSION: Mild atrophy.  No acute abnormality is noted.   Electronically Signed   By: Alcide Clever M.D.   On: 02/13/2015 15:39   US Renal  02/07/2015   CLINICAL DATA:  49 year old male with a history of acute renal failure, chronic renal failure, cirrhosis.  EXAM: RENAL / URINARY TRACT ULTRASOUND COMPLETE  COMPARISON:  None.  FINDINGS: Right Kidney:  Length: 12.3 cm. No evidence of right-sided hydronephrosis. Echogenicity similar to that of the visualized adjacent liver parenchyma. Flow confirmed in the hilum of the right kidney.  Left Kidney:   Length: 12.0 cm. Left kidney incompletely visualized, however, there does not appear to be significant hydronephrosis.  Moderate to large volume of ascites.  IMPRESSION: No evidence of left or right hydronephrosis to indicate obstruction.  Moderate to large volume of ascites.  Signed,  Yvone Neu. Loreta Ave, DO  [facility]  [facility]   Electronically Signed   By: Gilmer Mor D.O.   On: 02/07/2015 12:46   US Paracentesis  02/07/2015   CLINICAL DATA:  Abdominal distention. Alcoholic cirrhosis. Massive ascites. Request diagnostic and therapeutic paracentesis of up to 4 L max.  EXAM: ULTRASOUND GUIDED PARACENTESIS  COMPARISON:  None.  PROCEDURE: An ultrasound guided paracentesis was thoroughly discussed with the patient and questions answered. The benefits, risks, alternatives and complications were also discussed. The patient understands and wishes to proceed with the procedure. Written consent was obtained.  Ultrasound was performed to localize and mark an adequate pocket of fluid in the left lower quadrant of the abdomen. The area was then prepped and draped in the normal sterile fashion. 1% Lidocaine was used for local anesthesia. Under ultrasound guidance a 19 gauge Yueh catheter was introduced. Paracentesis was performed. The catheter was removed and  a dressing applied.  COMPLICATIONS: None immediate  FINDINGS: A total of approximately 3.8 L of cloudy, yellow fluid was removed. A fluid sample was sent for laboratory analysis.  IMPRESSION: Successful ultrasound guided paracentesis yielding 3.8 L of ascites.  Read by: Brayton El PA-C   Electronically Signed   By: Gilmer Mor D.O.   On: 02/07/2015 12:08   Dg Chest Port 1 View  02/20/2015   CLINICAL DATA:  Congestive failure  EXAM: PORTABLE CHEST - 1 VIEW  COMPARISON:  02/19/2015  FINDINGS: Cardiac shadow is stable. Right-sided PICC line is again identified and stable. Patchy opacities are again identified  throughout both lungs. No sizable effusion or pneumothorax is noted.  IMPRESSION: Overall stable appearance of the chest when given some technical variations.   Electronically Signed   By: Alcide Clever M.D.   On: 02/20/2015 07:15   Dg Chest Port 1 View  02/19/2015   CLINICAL DATA:  Respiratory failure  EXAM: PORTABLE CHEST - 1 VIEW  COMPARISON:  02/18/2015  FINDINGS: Progressive interstitial and airspace opacity in the bilateral lungs, asymmetric to the right. No pleural effusion. Normal heart size for technique.  Stable right upper extremity PICC, tip at the upper right atrium.  IMPRESSION: Progressive bilateral lung opacity. Edema or pneumonia could have this appearance.   Electronically Signed   By: Marnee Spring M.D.   On: 02/19/2015 07:02   Dg Chest Port 1 View  02/18/2015   CLINICAL DATA:  Respiratory failure, cirrhosis, encephalopathy.  EXAM: PORTABLE CHEST - 1 VIEW  COMPARISON:  Portable chest x-ray of February 17, 2015  FINDINGS: The lungs are slightly less well inflated today. Confluent alveolar opacity is more conspicuous in the right upper lobe. The interstitial markings elsewhere in both lungs remain mildly increased. The cardiac silhouette is top-normal in size. The pulmonary vascularity is not engorged. The PICC line tip projects over the junction of the middle and distal SVC.  IMPRESSION: Slight interval increase in conspicuity of alveolar opacity in the right upper lobe consistent with pneumonia. Mild interstitial edema is stable.   Electronically Signed   By: David  [place] M.D.   On: 02/18/2015 07:06   Dg Chest Port 1 View  02/17/2015   CLINICAL DATA:  Respiratory failure .  EXAM: PORTABLE CHEST - 1 VIEW  COMPARISON:  02/16/2015 .  FINDINGS: Interim extubation and removal of NG tube. Right PICC line stable position. Mediastinum and hilar structures normal. Cardiomegaly with mild pulmonary vascular prominence and interstitial prominence consistent mild congestive heart failure . No pleural  effusion or pneumothorax.  IMPRESSION: 1. Interim removal of endotracheal tube and NG tube. Right PICC line in stable position. 2. Cardiomegaly with persistent diffuse interstitial prominence consistent with congestive heart failure. Pneumonitis cannot be excluded.   Electronically Signed   By: Maisie Fus  Register   On: 02/17/2015 07:07   Dg Chest Port 1 View  02/16/2015   CLINICAL DATA:  Hypoxia  EXAM: PORTABLE CHEST - 1 VIEW  COMPARISON:  February 15, 2015  FINDINGS: Endotracheal tube tip is 3.2 cm above the carina. Central catheter tip is in the right atrium. Nasogastric tube tip and side port are below the diaphragm. No pneumothorax.  There is mild interstitial edema. There is no airspace consolidation. Heart is normal in size and contour. Pulmonary vascularity are normal. No adenopathy. There is a probable bone island in the proximal right humerus, stable. There is arthropathy in both shoulders.  IMPRESSION: Tube and catheter positions as described without pneumothorax. Mild  generalized interstitial edema without airspace consolidation. No change in cardiac silhouette.   Electronically Signed   By: Bretta Bang III M.D.   On: 02/16/2015 07:10   Dg Chest Port 1 View  02/15/2015   CLINICAL DATA:  Cirrhosis, varices and gastrointestinal bleeding. Intubated patient.  EXAM: PORTABLE CHEST - 1 VIEW  COMPARISON:  Single view of the chest 02/14/2015 and 02/11/2015.  FINDINGS: Support tubes and lines are unchanged. Lung volumes are low but the lungs are clear. Heart size is normal. No pneumothorax or pleural effusion.  IMPRESSION: No change in support apparatus.  No acute disease.   Electronically Signed   By: Drusilla Kanner M.D.   On: 02/15/2015 15:15   Dg Chest Port 1 View  02/14/2015   CLINICAL DATA:  Acute encephalopathy  EXAM: PORTABLE CHEST - 1 VIEW  COMPARISON:  02/11/2015  FINDINGS: Cardiomediastinal silhouette is stable. Again noted NG tube and endotracheal tube unchanged in position. No acute  infiltrate or pulmonary edema. Hypoinflation again noted.  IMPRESSION: No active disease. Stable support apparatus. Hypoinflation again noted.   Electronically Signed   By: Natasha Mead M.D.   On: 02/14/2015 09:04   Dg Chest Port 1 View  02/11/2015   CLINICAL DATA:  Respiratory failure, intubated patient.  EXAM: PORTABLE CHEST - 1 VIEW  COMPARISON:  Portable chest x-ray of February 09, 2015  FINDINGS: The lungs remain hypoinflated. The interstitial markings remain diffusely increased but have improved slightly. The cardiac silhouette is mildly enlarged though stable. The central pulmonary vascularity prominent. The endotracheal tube tip lies 4.5 cm above the carina. The esophagogastric tube tip projects in the gastric cardia and is stable. There are degenerative changes of both shoulders.  IMPRESSION: Persistent bilateral hypoinflation. Slight interval improvement in the appearance of the pulmonary interstitium. There is no alveolar pneumonia. Support tubes are in reasonable position.   Electronically Signed   By: David  [place] M.D.   On: 02/11/2015 07:37   Dg Chest Port 1 View  02/09/2015   CLINICAL DATA:  Central line repositioning  EXAM: PORTABLE CHEST - 1 VIEW  COMPARISON:  February 07, 2015  FINDINGS: Central catheter tip is at the junction of the left innominate vein and superior vena cava. Endotracheal tube tip is 2.5 cm above carina. Nasogastric tube tip and side port are in the stomach. No pneumothorax. There is no edema or consolidation. Heart is upper normal in size with pulmonary vascularity within normal limits. No adenopathy. There is an apparent Hill-Sachs defect in the left humeral head. There is arthropathy in each shoulder.  IMPRESSION: Tube and catheter positions as described without pneumothorax. No edema or consolidation.   Electronically Signed   By: Bretta Bang III M.D.   On: 02/09/2015 02:41   Dg Chest Port 1 View  02/07/2015   CLINICAL DATA:  Withdrawal of central line  EXAM: PORTABLE  CHEST - 1 VIEW  COMPARISON:  Portable exam 1348 hours compared to 02/06/2015  FINDINGS: LEFT jugular central venous catheter with tip projecting over cavoatrial junction.  Minimal enlargement of cardiac silhouette.  Pulmonary vascular congestion.  Perihilar infiltrates question edema versus infection.  Low lung volumes.  No pleural effusion or pneumothorax.  Tip of endotracheal tube at carina, recommend withdrawal 2 cm.  IMPRESSION: Tip of LEFT jugular 9 not projects over cavoatrial junction.  Tip of endotracheal tube now at carina, recommend withdrawal 2 cm.  Perihilar infiltrates question edema versus infection.  Critical Value/emergent results were called by telephone at the time  of interpretation on 02/07/2015 at 1403 hr to Onalee Hua RN (in ICU Stepdown), who verbally acknowledged these results.   Electronically Signed   By: Ulyses Southward M.D.   On: 02/07/2015 14:04   Dg Chest Port 1 View  02/06/2015   CLINICAL DATA:  Patient with stroke status post line placement.  EXAM: PORTABLE CHEST - 1 VIEW  COMPARISON:  Earlier same date  FINDINGS: Interval insertion of endotracheal tube terminating 15 mm superior to the carina. New left IJ central venous catheter tip projects over the right atrium. Stable cardiac and mediastinal contours. Low lung volumes. Unchanged diffuse bilateral coarse heterogeneous opacities. No pleural effusion or pneumothorax.  IMPRESSION: New left IJ central venous catheter tip projects over the right atrium, consider retraction.  ET tube terminates in the distal trachea.  Unchanged heterogeneous opacities bilaterally potentially secondary to bronchitis.  These results were called by telephone at the time of interpretation on 02/06/2015 at 4:37 pm to Dr. Lawanda Cousins , who verbally acknowledged these results.   Electronically Signed   By: Annia Belt M.D.   On: 02/06/2015 16:38   Dg Chest Port 1 View  02/06/2015   CLINICAL DATA:  49 year old male with shortness breath and chest pain for the past 12  hr. Vomiting.  EXAM: PORTABLE CHEST - 1 VIEW  COMPARISON:  No priors.  FINDINGS: Lung volumes are low. Severe diffuse peribronchial cuffing. No consolidative airspace disease. No pleural effusions. No evidence of pulmonary edema. Heart size is normal. Mediastinal contours are unremarkable.  IMPRESSION: 1. Severe diffuse peribronchial cuffing, concerning for severe acute bronchitis.   Electronically Signed   By: Trudie Reed M.D.   On: 02/06/2015 10:32   Dg Chest Port 1v Same Day  02/06/2015   CLINICAL DATA:  Hypoxia. Requested per MD to assess whether radiographic distortion affects prior imaging of IJ line and ETT  EXAM: PORTABLE CHEST - 1 VIEW SAME DAY  COMPARISON:  02/06/2015 at 1604 hours  FINDINGS: Left internal jugular central venous line catheter tip again projects in the right atrium.  Endotracheal tube tip projects 1 cm above the carina. This is also stable.  Lung volumes are low. There is interstitial prominence but no areas of lung consolidation and no overt pulmonary edema. No pneumothorax.  IMPRESSION: 1. No change from the earlier study. 2. Left internal jugular central venous line tip again projects in the right atrium. Endotracheal tube tip projects 1 cm above the carina.   Electronically Signed   By: Amie Portland M.D.   On: 02/06/2015 18:00   Dg Abd Portable 1v  02/21/2015   CLINICAL DATA:  Status post nasogastric tube placement.  EXAM: PORTABLE ABDOMEN - 1 VIEW  COMPARISON:  02/21/2015 at 11:13 a.m.  FINDINGS: Nasogastric tube tip projects in the distal stomach, well positioned.  Air-filled prominent small bowel is again noted, unchanged from the earlier study.  IMPRESSION: Nasogastric tube tip well-positioned within the distal stomach.   Electronically Signed   By: Amie Portland M.D.   On: 02/21/2015 15:06   Dg Abd Portable 1v  02/17/2015   CLINICAL DATA:  Abdominal distention  EXAM: PORTABLE ABDOMEN - 1 VIEW  COMPARISON:  February 13, 2015  FINDINGS: There is no demonstrable bowel  dilatation. No air-fluid levels are seen on this supine examination. No free air appreciable. No abnormal calcifications.  IMPRESSION: Bowel gas pattern overall unremarkable.   Electronically Signed   By: Bretta Bang III M.D.   On: 02/17/2015 11:16   Dg Abd  Portable 1v  02/13/2015   CLINICAL DATA:  Abdominal distension.  EXAM: PORTABLE ABDOMEN - 1 VIEW  COMPARISON:  February 12, 2015.  FINDINGS: Stable dilated small bowel loops are seen in the central and right side of the abdomen most consistent with ileus. No significant colonic dilatation is noted. No abnormal calcifications are noted.  IMPRESSION: Stable dilated small bowel loops are noted most consistent with ileus, but continued radiographic follow-up is recommended to rule out obstruction.   Electronically Signed   By: Lupita Raider, M.D.   On: 02/13/2015 12:07   Dg Abd Portable 1v  02/12/2015   CLINICAL DATA:  Ascites.  Ileus.  EXAM: PORTABLE ABDOMEN - 1 VIEW  COMPARISON:  02/09/2015 .  02/08/2015.  FINDINGS: NG tube noted coiled in stomach. Dilated loops of small large bowel noted consistent with adynamic ileus. Follow-up abdominal series suggested to demonstrate clearing and to exclude bowel obstruction. No free air. Probable ascites. No acute bony abnormality.  IMPRESSION: 1.  NG tube noted in good anatomic position.  2. Distended loops of small and large bowel consistent with adynamic ileus. Follow-up abdominal series suggested to demonstrate clearing and to exclude bowel obstruction.  3.  Probable ascites.   Electronically Signed   By: Maisie Fus  Register   On: 02/12/2015 07:10     Medical Consultants:    Palliative Care: Canary Brim, NP  Cardiology: Wendall Stade, MD  Gastroenterology: Rachael Fee, MD  PCCM: Roslynn Amble, MD  Anti-Infectives:    Rocephin, start date 8/6 >> 8/18  Vancomycin, 8/8 >> 8/11 restart 8/18  Pip-tazo 8/18 >>  Subjective:   Kallon Scheel reports that he is still having abdominal  discomfort.  He has increased abdominal bloating.  Reports that the pain medication helps.  No nausea or vomiting.    Objective:    Filed Vitals:   02/24/15 0406 02/24/15 1329 02/24/15 2028 02/25/15 0547  BP: 97/59 106/77 111/78 102/61  Pulse: 113 115 118 117  Temp: 98.1 F (36.7 C) 98.4 F (36.9 C) 98.1 F (36.7 C) 97.9 F (36.6 C)  TempSrc: Oral Oral Oral Oral  Resp: Height:    (1.778 m)   Weight: 108.7 kg (239 lb 10.2 oz)   107.2 kg (236 lb 5.3 oz)  SpO2: 99% 100% 100% 100%    Intake/Output Summary (Last 24 hours) at 02/25/15 0742 Last data filed at 02/25/15 0100  Gross per 24 hour  Intake    790 ml  Output    350 ml  Net    440 ml    Exam: Gen/Psych:  Depressed affect Cardiovascular:  Tachy, No M/R/G Respiratory:  Lungs diminished Gastrointestinal:  Abdomen tense, distended, + BS Extremities:  3+ edema   Data Reviewed:    Labs: Basic Metabolic Panel:  Recent Labs Lab 02/19/15 0630 02/20/15 0415 02/21/15 0428 02/22/15 0500 02/23/15 0515 02/24/15 0450  NA 135 134* 133*  --  133* 133*  K 3.3* 2.9* 3.4*  --  2.9* 3.1*  CL 110 109 109  --  106 106  CO2 20* 20* 19*  --  21* 19*  GLUCOSE 96 84 117*  --  118* 98  BUN 5* <5* 5*  --  6 <5*  CREATININE 0.88 0.92 0.97  --  0.99 0.93  CALCIUM 7.4* 7.3* 7.3*  --  7.4* 7.5*  MG 1.7 1.5* 1.5* 1.8 1.5*  --   PHOS 2.4* 2.7 2.1* 3.0 2.8 3.1  GFR Estimated Creatinine Clearance: 115.2 mL/min (by C-G formula based on Cr of 0.93). Liver Function Tests:  Recent Labs Lab 02/21/15 0428 02/24/15 0450  AST 90* 68*  ALT 28 24  ALKPHOS 83 63  BILITOT 2.2* 2.9*  PROT 6.3* 6.4*  ALBUMIN 1.7* 1.7*   Coagulation profile  Recent Labs Lab 02/20/15 0415 02/21/15 0428 02/22/15 0500 02/23/15 0515 02/24/15 0450  INR 3.86* 3.37* 3.29* 3.20* 3.11*    CBC:  Recent Labs Lab 02/20/15 0415 02/20/15 0538 02/20/15 1430 02/21/15 0428 02/23/15 0515 02/24/15 0450  WBC 11.8*  --   --  12.0* 11.3*  9.6  HGB 6.8* 6.8* 7.8* 7.4* 6.8* 7.5*  HCT 20.7* 20.6* 22.8* 21.7* 19.7* 22.5*  MCV 84.5  --   --  83.8 83.1 82.4  PLT 57*  --   --  78* 93* 87*   CBG:  Recent Labs Lab 02/24/15 0001 02/24/15 0403 02/24/15 0810 02/24/15 1155 02/24/15 1711  GLUCAP 102* 91 121* 127* 107*   Sepsis Labs:  Recent Labs Lab 02/19/15 0630 02/19/15 1329 02/20/15 0415 02/21/15 0428 02/23/15 0515 02/24/15 0450  PROCALCITON 0.16 0.11 0.10 <0.10  --   --   WBC  --   --  11.8* 12.0* 11.3* 9.6   Microbiology Recent Results (from the past 240 hour(s))  Culture, blood (routine x 2)     Status: None   Collection Time: 02/19/15  1:25 PM  Result Value Ref Range Status   Specimen Description BLOOD LEFT ARM  Final   Special Requests IN PEDIATRIC BOTTLE 1CC  Final   Culture  Setup Time   Final    GRAM POSITIVE COCCI IN CLUSTERS AEROBIC BOTTLE ONLY CRITICAL RESULT CALLED TO, READ BACK BY AND VERIFIED WITH: C. LITTLE,RN AT 0819 ON [id number] BY Lucienne Capers    Culture   Final    STAPHYLOCOCCUS SPECIES (COAGULASE NEGATIVE) THE SIGNIFICANCE OF ISOLATING THIS ORGANISM FROM A SINGLE SET OF BLOOD CULTURES WHEN MULTIPLE SETS ARE DRAWN IS UNCERTAIN. PLEASE NOTIFY THE MICROBIOLOGY DEPARTMENT WITHIN ONE WEEK IF SPECIATION AND SENSITIVITIES ARE REQUIRED. Performed at [facility]    Report Status 02/22/2015 FINAL  Final  Culture, blood (routine x 2)     Status: None   Collection Time: 02/19/15  1:29 PM  Result Value Ref Range Status   Specimen Description BLOOD LEFT HAND  Final   Special Requests BOTTLES DRAWN AEROBIC ONLY [facility]  Final   Culture   Final    NO GROWTH 5 DAYS Performed at [facility]    Report Status 02/24/2015 FINAL  Final     Medications:   . sodium chloride   Intravenous Once  . feeding supplement (ENSURE ENLIVE)  237 mL Oral BID BM  . furosemide  40 mg Oral BID  . lactulose  10 g Oral TID  . pantoprazole  40 mg Oral BID  . piperacillin-tazobactam (ZOSYN)  IV  3.375 g  Intravenous Q8H  . potassium chloride  20 mEq Oral TID  . rifaximin  550 mg Oral BID  . sodium chloride  10-40 mL Intracatheter Q12H  . thiamine  100 mg Oral Daily  . vancomycin  1,000 mg Intravenous Q24H   Continuous Infusions:   Time spent: 35 minutes with > 50% of time discussing current diagnostic test results, clinical impression and plan of care with the patient and his family.   LOS: 19 days   Javeon Macmurray  [facility] Pager [phone]. If unable to reach me by pager,  please call my cell phone at [phone].  *Please refer to [email or web address], password [email or web address] to get updated schedule on who will round on this patient, as hospitalists switch teams weekly. If 7PM-7AM, please contact night-coverage at [email or web address], password [email or web address] for any overnight needs.  02/25/2015, 7:42 AM

## 2015-02-25 NOTE — Clinical Social Work Note (Signed)
Clinical Social Work Assessment  Patient Details  Name: Francisco [name] MRN: [id number] Date of Birth: 22-Mar-1966  Date of referral:  02/25/15               Reason for consult:  Facility Placement                Permission sought to share information with:  Oceanographer granted to share information::  Yes, Verbal Permission Granted  Name::        Agency::     Relationship::     Contact Information:     Housing/Transportation Living arrangements for the past 2 months:  Single Family Home Source of Information:  Patient, Spouse, Parent Patient Interpreter Needed:  None Criminal Activity/Legal Involvement Pertinent to Current Situation/Hospitalization:  No - Comment as needed Significant Relationships:  Spouse, Parents, Siblings Lives with:  Spouse, Parents Do you feel safe going back to the place where you live?  Yes Need for family participation in patient care:  Yes (Comment)  Care giving concerns:  CSW reviewed PT evaluation recommending SNF at discharge.    Social Worker assessment / plan:  CSW & RNCM, Olegario Messier spoke with patient at bedside along with wife, Fleet Contras & mother, Debarah Crape via phone re: discharge planning. Patient informed CSW that he was in a SNF/Rehab facility in [place] for about a month and discharged home then came down to visit his mother here in [place] and "got sick & came to the hospital".   Employment status:    Health and safety inspector:  [employer] PT Recommendations:  Skilled Nursing Facility Information / Referral to community resources:  Skilled Nursing Facility  Patient/Family's Response to care:  CSW explained to patient, wife & mother that due to patient having out of state Medicaid, that a SNF in [place] would not be covered until they get the Medicaid switched from [place] to [facility] CSW provided Medicaid application & information on how to apply through Department of Social Services.  CSW also had Tammy from [facility] speak with patient & wife re: Medicaid/Disability - once patient is approved for [facility], patient would be able to go to [facility] SNF - wife & patient aware.   Patient explained to CSW that patient's mother, his sister & 2 nieces (one of which is a Community education officer) and his cousin (whom he says is not working and would be able to come and stay with him) all live within close proximity to his mother here in [place]. RNCM, Olegario Messier spoke with patient, wife & mother about home care.   Patient/Family's Understanding of and Emotional Response to Diagnosis, Current Treatment, and Prognosis:  Patient's wife & mother were concerned about patient being discharged "too soon". CSW & RNCM explained that discharge plan needs to be established and setup prior to discharge.   Emotional Assessment Appearance:  Appears older than stated age Attitude/Demeanor/Rapport:    Affect (typically observed):  Pleasant, Quiet, Accepting Orientation:  Oriented to Self, Oriented to Place, Oriented to  Time, Oriented to Situation Alcohol / Substance use:    Psych involvement (Current and /or in the community):     Discharge Needs  Concerns to be addressed:    Readmission within the last 30 days:    Current discharge risk:    Barriers to Discharge:      Arlyss Repress, LCSW 02/25/2015, 2:15 PM

## 2015-02-25 NOTE — Care Management Note (Signed)
Case Management Note  Patient Details  Name: Lyndall Windt MRN: [id number] Date of Birth: 1966-04-08  Subjective/Objective:   CM/CSW along w/SNF rep went into patient's rm to discuss d/c plans.  Per patient request spouse called-speaker phone w/patient's mother.Discussed HHC services-limited, intermittent skilled services. Provided HHC agency list, private sitter list(out of pocket expense, independent decision), informed of currently no insurance since medicaid out state-MD, until medicaid for [place] has been approved he has no insurance.  Spouse/mother-loudly voiced understanding since they feel that patient should stay in the hospital to get rehab-informed them that rehab takes place in another level of care setting-either home or SNF. Will await MD recommendations, & HHC agency choice by spouse/mother.CSW also following for SNF options, insurance info to family.             Action/Plan:Monitor progress for d/c plans.   Expected Discharge Date:   (unknown)               Expected Discharge Plan:  Home w Home Health Services  In-House Referral:  Clinical Social Work  Discharge planning Services  CM Consult  Post Acute Care Choice:  NA Choice offered to:  Spouse  DME Arranged:    DME Agency:     HH Arranged:    HH Agency:     Status of Service:  In process, will continue to follow  Medicare Important Message Given:    Date Medicare IM Given:    Medicare IM give by:    Date Additional Medicare IM Given:    Additional Medicare Important Message give by:     If discussed at Long Length of Stay Meetings, dates discussed:    Additional Comments:  Lanier Clam, RN 02/25/2015, 2:26 PM

## 2015-02-26 DIAGNOSIS — I472 Ventricular tachycardia: Secondary | ICD-10-CM

## 2015-02-26 MED ORDER — FUROSEMIDE 40 MG PO TABS: 40.0000 mg | ORAL_TABLET | Freq: Every day | ORAL | Status: DC

## 2015-02-26 MED ORDER — MAGNESIUM OXIDE 400 (241.3 MG) MG PO TABS: 400.0000 mg | ORAL_TABLET | Freq: Two times a day (BID) | ORAL | Status: AC

## 2015-02-26 MED ORDER — POTASSIUM CHLORIDE CRYS ER 20 MEQ PO TBCR: 20.0000 meq | EXTENDED_RELEASE_TABLET | Freq: Three times a day (TID) | ORAL | Status: AC

## 2015-02-26 MED ORDER — ENSURE ENLIVE PO LIQD: 237.0000 mL | Freq: Two times a day (BID) | ORAL | Status: AC

## 2015-02-26 MED ORDER — LACTULOSE 10 GM/15ML PO SOLN: 30.0000 g | Freq: Three times a day (TID) | ORAL | Status: AC

## 2015-02-26 MED ORDER — OXYCODONE HCL 5 MG PO TABS: 5.0000 mg | ORAL_TABLET | ORAL | Status: DC | PRN

## 2015-02-26 NOTE — Progress Notes (Signed)
Patient discharged home via ambulance. Patient in no distress. Wife aware of transportation home.

## 2015-02-26 NOTE — Progress Notes (Signed)
Patient discharge instructions given and explained to patient/wife and they verbalized understanding, patient denies any distress. Patient waiting for EMS transporter to transport him home. MASD due to diarrhea/incontient at times. No other wound noted.

## 2015-02-26 NOTE — Care Management Note (Signed)
Case Management Note  Patient Details  Name: Francisco [name] MRN: [id number] Date of Birth: August 27, 1965  Subjective/Objective: Patient spoke in confidence while Dr. [name]/myself/& CSW was in rm-he said his wife does not know how to talk, so address all questions to him.Patient chose [facility] for HHC, [facility] for pcp,& [facility] service-Jessica liason-will call him in rm to explain services. [facility] rep aware of HHC orders, & rw order.Patient will stay w/his mother in [place], he says he has a cousin who can assist w/his needs. Received message from nurse to talk to patient about his insurance-called into rm-patient asked me to talk to his spouse-I explained that I do not verify insurance & directed her to go to the admitting dept for verification of insurance.She voiced understanding. Also explained that [facility] has their process for verifying insurance & they will let him know what his co pay is or if he qualifies for patient assistance services.She voiced understanding.                Action/Plan:d/c home w/HHC.   Expected Discharge Date:   (unknown)               Expected Discharge Plan:  Home w Home Health Services  In-House Referral:  Clinical Social Work  Discharge planning Services  CM Consult  Post Acute Care Choice:  NA Choice offered to:  Patient  DME Arranged:  Walker rolling DME Agency:  [facility]  HH Arranged:  RN, PT, OT, Nurse's Aide (Child psychotherapist) HH Agency:     Status of Service:  Completed, signed off  Medicare Important Message Given:    Date Medicare IM Given:    Medicare IM give by:    Date Additional Medicare IM Given:    Additional Medicare Important Message give by:     If discussed at Long Length of Stay Meetings, dates discussed:    Additional Comments:  Lanier Clam, RN 02/26/2015, 12:02 PM

## 2015-02-26 NOTE — Care Management (Signed)
This Case Manager received communication from Lanier Clam, RN CM that patient with [name]. Patient recently moved to [place], [place] and will be living with his mother; therefore, patient does not have a physician in [place], [place].  Hospital follow-up appointment obtained on 03/03/15 at 1115 with Dr. Venetia Night. Appointment placed on AVS.  Spoke with patient to inform of appointment and to discuss resources available at [facility] and [facility] including onsite Pharmacy. Patient verbalized understanding and appreciative of appointment.  Lanier Clam, RN CM also updated.

## 2015-02-26 NOTE — Progress Notes (Signed)
CSW consulted for transportation needs. Patient will need non-emergency ambulance transport home. CSW confirmed home address with patient's wife, Fleet Contras.  [facility] will be called for transport when ready.   No other CSW needs identified - CSW signing off.   Francisco Maxin, LCSW [facility] Clinical Social Worker cell #: [phone]

## 2015-02-26 NOTE — Progress Notes (Signed)
Physical Therapy Treatment Patient Details Name: Francisco [name] MRN: [id number] DOB: [birth year] Today's Date: 02/26/2015    History of Present Illness 49 yo male admitted with GI bleed, VDRF, acute encephalopathy, anemia. Intubated 8/6, extubated 8/16, fall during hospital stay. Hx of cirrhosis, liver failure, renal failure    PT Comments    Pt remains Min-Mod assist for bed mobility. Min guard assist for ambulation on today however distance limited due to discomfort in groin area. Pt reported to therapist that he will d/c home on today. Family present at end of session-explained level of assist pt is requiring. Continue to feel ST rehab would be beneficial however pt is set to d/c home on today. HHPT could be beneficial at home if this can be arranged.   Follow Up Recommendations  SNF (however, pt states he will d/c home on today. HHPT could be beneficial if this can be arranged)     Equipment Recommendations  Rolling walker with 5" wheels (wide if possible)    Recommendations for Other Services       Precautions / Restrictions Precautions Precautions: Fall Restrictions Weight Bearing Restrictions: No    Mobility  Bed Mobility Overal bed mobility: Needs Assistance Bed Mobility: Supine to Sit     Supine to sit: Min assist;HOB elevated Sit to supine: Mod assist   General bed mobility comments: Assist for trunk and LEs. Increased time. Moderate reliance on bedrail.   Transfers Overall transfer level: Needs assistance Equipment used: Rolling walker (2 wheeled) Transfers: Sit to/from Stand Sit to Stand: Min assist;From elevated surface         General transfer comment: Assist to rise, stabilize, control descent. VCs safety, technique, hand placement  Ambulation/Gait Ambulation/Gait assistance: Min guard Ambulation Distance (Feet): 15 Feet Assistive device: Rolling walker (2 wheeled) Gait Pattern/deviations: Wide base of support;Decreased stride length;Decreased step  length - right;Decreased step length - left     General Gait Details: Wide bos due to discomfort in groin area. dyspnea 2-3/4 with ambulation on today. Pt declined to ambulate any farther due to discomfort. Returned to room at pt's request.    Stairs            Wheelchair Mobility    Modified Rankin (Stroke Patients Only)       Balance           Standing balance support: During functional activity Standing balance-Leahy Scale: Fair Standing balance comment: needs RW for ambulation                    Cognition Arousal/Alertness: Awake/alert Behavior During Therapy: WFL for tasks assessed/performed Overall Cognitive Status: Within Functional Limits for tasks assessed                      Exercises      General Comments        Pertinent Vitals/Pain Pain Assessment: Faces Faces Pain Scale: Hurts even more Pain Location: groin area Pain Descriptors / Indicators: Sore Pain Intervention(s): Monitored during session;Limited activity within patient's tolerance;Repositioned    Home Living                      Prior Function            PT Goals (current goals can now be found in the care plan section) Progress towards PT goals: Progressing toward goals (slowly)    Frequency  Min 3X/week    PT Plan Current plan remains appropriate  Co-evaluation             End of Session Equipment Utilized During Treatment: Gait belt Activity Tolerance: Patient limited by fatigue;Patient limited by pain Patient left: in bed;with call bell/phone within reach;with family/visitor present     Time: 2956-2130 PT Time Calculation (min) (ACUTE ONLY): 22 min  Charges:  $Gait Training: 8-22 mins                    G Codes:      Rebeca Alert, MPT Pager: [phone]

## 2015-02-26 NOTE — Discharge Summary (Signed)
Physician Discharge Summary  Ontario Pettengill ZOX:[id number] DOB: [birth year] DOA: 02/06/2015  PCP: Pcp Not In System  Admit date: 02/06/2015 Discharge date: 02/26/2015   Recommendations for Outpatient Follow-Up:   1. Patient is from [place] and has been referred to [facility] for outpatient follow-up. 2. Home health nursing services have been set up including: RN, PT, OT, home health aide and SW. 3. Recommend outpatient referral to hepatologist. 4. Please schedule for an outpatient 2-D echo in 4 weeks per cardiology recommendations.   Discharge Diagnosis:   Principal Problem:    Severe sepsis with septic shock Active Problems:    Acute upper GI bleed    Cirrhosis    Acute blood loss anemia    Encephalopathy, hepatic    Ascites    Acute respiratory failure with hypoxia    Acute encephalopathy    Abnormal echocardiogram    NSVT (nonsustained ventricular tachycardia)    Esophageal varices    Palliative care encounter    Weakness generalized    DNR (do not resuscitate) discussion   Discharge disposition:  Home.    Discharge Condition: Improved.  Diet recommendation: Low sodium, heart healthy.    History of Present Illness:   Francisco [name] is an 49 y.o. male with a PMH of cirrhosis and esophageal varices status post banding, recently discharged from a SNF after a prolonged hospitalization at an OSH, was admitted on 02/06/15 with acute GI bleeding, hypotension/shock, and hepatic encephalopathy requiring ventilator support and ICU care from 02/06/15-02/22/15 under the care of the critical care team.  Hospital Course by Problem:   Principal Problem:  Severe sepsis with septic shock and acute respiratory failure/hypoxia secondary to spontaneous bacterial peritonitis with ascitic cultures positive for bacillus/lactic acidosis - Admitted to the critical care team with elective intubation and initiation of vasopressin support. - Pressors weaned 02/16/15, extubated 02/18/15. -  Source of sepsis likely from SBP with cultures growing bacillus from peritoneal cultures done 02/08/15. - Septic shock resolved with appropriate antibiotics including Rocephin followed by a course of vancomycin/Zosyn.  Active Problems:  Adynamic ileus - Appears to be resolved, eating.   Acute renal failure/chronic kidney disease, stage II - Creatinine normalized with restoration of blood pressure.   Hypokalemia/hypomagnesemia - Discharged home on supplementation.   Acute upper GI bleed / peptic ulcers / history of esophageal varices status post banding - EGD done 02/06/15, no varices but small peptic ulcers seen.  - Maintained on a PPI while in the hospital.   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  - Mental status clear.   Acute blood loss anemia / coagulopathy secondary to cirrhosis / thrombocytopenia secondary to cirrhosis - Secondary to acute GI bleeding. - Patient refuses blood transfusion despite drop in hemoglobin to 6.8. Hemoglobin stable at 8.1 at discharge.   Ascites - Status post 3.8 L paracentesis 02/07/15. - Status post repeat paracentesis of 2 L 02/12/15. - Refuses further paracentesis despite tense ascites.   Abnormal echocardiogram/grade 1 diastolic dysfunction/prolonged QTC/bradycardia - Evaluated by cardiology for evaluation of abnormality on echo concerning for vegetation. Findings consistent with artifact. - Follow-up 2-D echo in 4 weeks per cardiology recommendations.   NSVT (nonsustained ventricular tachycardia) - Monitor electrolytes. OK to D/C tele.   Obesity  - Body mass index is 34.1 kg/(m^2).  Medical Consultants:    Palliative Care: Canary Brim, NP  Cardiology: Wendall Stade, MD  Gastroenterology: Rachael Fee, MD  PCCM: Roslynn Amble, MD   Discharge  Exam:   Filed Vitals:   02/26/15 1313  BP: 106/77  Pulse: 122  Temp: 98.2 F (36.8 C)  Resp: 18   Filed Vitals:    02/25/15 1406 02/25/15 2042 02/26/15 0534 02/26/15 1313  BP: 108/68 112/78 105/77 106/77  Pulse: 18 119 118 122  Temp: 98.6 F (37 C) 98.4 F (36.9 C) 98.3 F (36.8 C) 98.2 F (36.8 C)  TempSrc: Oral Oral Oral Oral  Resp: 18 20 20 18   Height:      Weight:   106.3 kg (234 lb 5.6 oz)   SpO2: 100% 100% 100% 100%    Gen:  NAD Cardiovascular:  RRR, No M/R/G Respiratory: Lungs CTAB Gastrointestinal: Abdomen soft, NT/ND with normal active bowel sounds. Extremities: No C/E/C   The results of significant diagnostics from this hospitalization (including imaging, microbiology, ancillary and laboratory) are listed below for reference.     Procedures and Diagnostic Studies:      Labs:   Basic Metabolic Panel:  Recent Labs Lab 02/20/15 0415 02/21/15 0428 02/22/15 0500 02/23/15 0515 02/24/15 0450 02/25/15 0830  NA 134* 133*  --  133* 133* 134*  K 2.9* 3.4*  --  2.9* 3.1* 3.1*  CL 109 109  --  106 106 109  CO2 20* 19*  --  21* 19* 22  GLUCOSE 84 117*  --  118* 98 107*  BUN <5* 5*  --  6 <5* <5*  CREATININE 0.92 0.97  --  0.99 0.93 0.98  CALCIUM 7.3* 7.3*  --  7.4* 7.5* 7.5*  MG 1.5* 1.5* 1.8 1.5*  --  1.4*  PHOS 2.7 2.1* 3.0 2.8 3.1  --    GFR Estimated Creatinine Clearance: 108.9 mL/min (by C-G formula based on Cr of 0.98). Liver Function Tests:  Recent Labs Lab 02/21/15 0428 02/24/15 0450  AST 90* 68*  ALT 28 24  ALKPHOS 83 63  BILITOT 2.2* 2.9*  PROT 6.3* 6.4*  ALBUMIN 1.7* 1.7*   Coagulation profile  Recent Labs Lab 02/20/15 0415 02/21/15 0428 02/22/15 0500 02/23/15 0515 02/24/15 0450  INR 3.86* 3.37* 3.29* 3.20* 3.11*    CBC:  Recent Labs Lab 02/20/15 0415  02/20/15 1430 02/21/15 0428 02/23/15 0515 02/24/15 0450 02/25/15 0830  WBC 11.8*  --   --  12.0* 11.3* 9.6 9.3  HGB 6.8*  < > 7.8* 7.4* 6.8* 7.5* 8.1*  HCT 20.7*  < > 22.8* 21.7* 19.7* 22.5* 23.2*  MCV 84.5  --   --  83.8 83.1 82.4 82.6  PLT 57*  --   --  78* 93* 87* 105*  < > =  values in this interval not displayed.  CBG:  Recent Labs Lab 02/24/15 0001 02/24/15 0403 02/24/15 0810 02/24/15 1155 02/24/15 1711  GLUCAP 102* 91 121* 127* 107*   Microbiology Recent Results (from the past 240 hour(s))  Culture, blood (routine x 2)     Status: None   Collection Time: 02/19/15  1:25 PM  Result Value Ref Range Status   Specimen Description BLOOD LEFT ARM  Final   Special Requests IN PEDIATRIC BOTTLE 1CC  Final   Culture  Setup Time   Final    GRAM POSITIVE COCCI IN CLUSTERS AEROBIC BOTTLE ONLY CRITICAL RESULT CALLED TO, READ BACK BY AND VERIFIED WITH: C. LITTLE,RN AT 0819 ON [id number] BY Lucienne Capers    Culture   Final    STAPHYLOCOCCUS SPECIES (COAGULASE NEGATIVE) THE SIGNIFICANCE OF ISOLATING THIS ORGANISM FROM A SINGLE SET OF BLOOD CULTURES WHEN  MULTIPLE SETS ARE DRAWN IS UNCERTAIN. PLEASE NOTIFY THE MICROBIOLOGY DEPARTMENT WITHIN ONE WEEK IF SPECIATION AND SENSITIVITIES ARE REQUIRED. Performed at [facility]    Report Status 02/22/2015 FINAL  Final  Culture, blood (routine x 2)     Status: None   Collection Time: 02/19/15  1:29 PM  Result Value Ref Range Status   Specimen Description BLOOD LEFT HAND  Final   Special Requests BOTTLES DRAWN AEROBIC ONLY 7CC  Final   Culture   Final    NO GROWTH 5 DAYS Performed at [facility]    Report Status 02/24/2015 FINAL  Final     Discharge Instructions:       Discharge Instructions    Call MD for:  extreme fatigue    Complete by:  As directed      Call MD for:  persistant nausea and vomiting    Complete by:  As directed      Call MD for:  severe uncontrolled pain    Complete by:  As directed      Call MD for:  temperature >100.4    Complete by:  As directed      Diet - low sodium heart healthy    Complete by:  As directed      Face-to-face encounter (required for Medicare/Medicaid patients)    Complete by:  As directed   I Francisco [name] certify that this patient is under my care and  that I, or a nurse practitioner or physician's assistant working with me, had a face-to-face encounter that meets the physician face-to-face encounter requirements with this patient on 02/26/2015. The encounter with the patient was in whole, or in part for the following medical condition(s) which is the primary reason for home health care (List medical condition): Adv cirrhosis, prolonged hospitalization for tx of sepsis complicated by resp failure.  Very deconditioned.  Needs RN to assure disease management education, assessment of status/monitor for deterioration.  SW to assess safety, access to care, assist with medicaid.  The encounter with the patient was in whole, or in part, for the following medical condition, which is the primary reason for home health care:  Adv cirrhosis, debilitation  I certify that, based on my findings, the following services are medically necessary home health services:   Nursing Physical therapy    Reason for Medically Necessary Home Health Services:   Skilled Nursing- Change/Decline in Patient Status Skilled Nursing- Changes in Medication/Medication Management Skilled Nursing- Skilled Assessment/Observation Skilled Nursing- Teaching of Disease Process/Symptom Management Therapy- Investment banker, operational, Patent examiner Therapy- Instruction on Safe use of Assistive Devices for ADLs Therapy- Home Adaptation to Facilitate Safety Therapy- Therapeutic Exercises to Increase Strength and Endurance    My clinical findings support the need for the above services:  Unable to leave home safely without assistance and/or assistive device  Further, I certify that my clinical findings support that this patient is homebound due to:  Unable to leave home safely without assistance     For home use only DME Walker rolling    Complete by:  As directed      Home Health    Complete by:  As directed   To provide the following care/treatments:   PT OT RN Home Health  Aide Social work       Increase activity slowly    Complete by:  As directed      Walker     Complete by:  As directed  Medication List    STOP taking these medications        naproxen sodium 220 MG tablet  Commonly known as:  ANAPROX      TAKE these medications        albuterol 108 (90 BASE) MCG/ACT inhaler  Commonly known as:  PROVENTIL HFA;VENTOLIN HFA  Inhale 1-2 puffs into the lungs every 6 (six) hours as needed for wheezing or shortness of breath.     feeding supplement (ENSURE ENLIVE) Liqd  Take 237 mLs by mouth 2 (two) times daily between meals.     furosemide 40 MG tablet  Commonly known as:  LASIX  Take 1 tablet (40 mg total) by mouth daily.  Notes to Patient:  2times per day     lactulose 10 GM/15ML solution  Commonly known as:  CHRONULAC  Take 45 mLs (30 g total) by mouth every 8 (eight) hours.     magnesium oxide 400 (241.3 MG) MG tablet  Commonly known as:  MAG-OX  Take 1 tablet (400 mg total) by mouth 2 (two) times daily.     oxyCODONE 5 MG immediate release tablet  Commonly known as:  Oxy IR/ROXICODONE  Take 1 tablet (5 mg total) by mouth every 4 (four) hours as needed for moderate pain.     potassium chloride SA 20 MEQ tablet  Commonly known as:  K-DUR,KLOR-CON  Take 1 tablet (20 mEq total) by mouth 3 (three) times daily.     ROLAIDS 550-110 MG Chew  Generic drug:  Ca Carbonate-Mag Hydroxide  Chew 1 tablet by mouth as needed (for heartburn).       Follow-up Information    Follow up with [facility]     On 03/03/2015.   Why:  Hospital follow-up appointment on 03/03/15 at 11:15 am with Dr. Venetia Night.   Contact information:   [address] [place] [address] [phone]      Follow up with [facility].   Why:  HHRN/HHPT/HHOT/HH Nurse's aide/HH Librarian, academic information:   [address] [place] [address] [phone]       Follow up with  [facility].   Why:  rolling walker   Contact information:   [address] [place] [address] [phone]        Time coordinating discharge: 35 minutes.  Signed:  Rashel [name]  Pager [phone] [facility] 02/26/2015, 4:03 PM

## 2015-02-28 ENCOUNTER — Encounter (HOSPITAL_COMMUNITY): Payer: Self-pay | Admitting: Emergency Medicine

## 2015-02-28 ENCOUNTER — Emergency Department (HOSPITAL_COMMUNITY)
Admission: EM | Admit: 2015-02-28 | Discharge: 2015-02-28 | Disposition: A | Discharge status: Home / Self Care | Payer: Medicaid - Out of State | Attending: Emergency Medicine | Admitting: Emergency Medicine

## 2015-02-28 DIAGNOSIS — Z992 Dependence on renal dialysis: Secondary | ICD-10-CM | POA: Insufficient documentation

## 2015-02-28 DIAGNOSIS — R34 Anuria and oliguria: Secondary | ICD-10-CM | POA: Diagnosis not present

## 2015-02-28 DIAGNOSIS — R2243 Localized swelling, mass and lump, lower limb, bilateral: Secondary | ICD-10-CM | POA: Diagnosis not present

## 2015-02-28 DIAGNOSIS — K7031 Alcoholic cirrhosis of liver with ascites: Secondary | ICD-10-CM | POA: Diagnosis not present

## 2015-02-28 DIAGNOSIS — I509 Heart failure, unspecified: Secondary | ICD-10-CM | POA: Diagnosis not present

## 2015-02-28 DIAGNOSIS — R000 Tachycardia, unspecified: Secondary | ICD-10-CM | POA: Insufficient documentation

## 2015-02-28 DIAGNOSIS — Z79899 Other long term (current) drug therapy: Secondary | ICD-10-CM | POA: Diagnosis not present

## 2015-02-28 DIAGNOSIS — R600 Localized edema: Secondary | ICD-10-CM

## 2015-02-28 DIAGNOSIS — N189 Chronic kidney disease, unspecified: Secondary | ICD-10-CM | POA: Diagnosis not present

## 2015-02-28 DIAGNOSIS — Z87891 Personal history of nicotine dependence: Secondary | ICD-10-CM | POA: Diagnosis not present

## 2015-02-28 DIAGNOSIS — N508 Other specified disorders of male genital organs: Secondary | ICD-10-CM | POA: Insufficient documentation

## 2015-02-28 DIAGNOSIS — N5089 Other specified disorders of the male genital organs: Secondary | ICD-10-CM

## 2015-02-28 DIAGNOSIS — D649 Anemia, unspecified: Secondary | ICD-10-CM | POA: Insufficient documentation

## 2015-02-28 DIAGNOSIS — D638 Anemia in other chronic diseases classified elsewhere: Secondary | ICD-10-CM

## 2015-02-28 DIAGNOSIS — K625 Hemorrhage of anus and rectum: Secondary | ICD-10-CM | POA: Diagnosis present

## 2015-02-28 LAB — COMPREHENSIVE METABOLIC PANEL
ALBUMIN: 1.9 g/dL — AB (ref 3.5–5.0)
ALK PHOS: 79 U/L (ref 38–126)
ALT: 25 U/L (ref 17–63)
AST: 69 U/L — AB (ref 15–41)
Anion gap: 6 (ref 5–15)
BILIRUBIN TOTAL: 2.5 mg/dL — AB (ref 0.3–1.2)
BUN: 6 mg/dL (ref 6–20)
CALCIUM: 8 mg/dL — AB (ref 8.9–10.3)
CO2: 20 mmol/L — AB (ref 22–32)
CREATININE: 1.19 mg/dL (ref 0.61–1.24)
Chloride: 108 mmol/L (ref 101–111)
GFR calc Af Amer: >60 mL/min (ref >60)
GFR calc non Af Amer: >60 mL/min (ref >60)
GLUCOSE: 98 mg/dL (ref 65–99)
Potassium: 4.3 mmol/L (ref 3.5–5.1)
Sodium: 134 mmol/L — ABNORMAL LOW (ref 135–145)
TOTAL PROTEIN: 7.7 g/dL (ref 6.5–8.1)

## 2015-02-28 LAB — I-STAT CHEM 8, ED
BUN: 4 mg/dL — ABNORMAL LOW (ref 6–20)
CREATININE: 1.1 mg/dL (ref 0.61–1.24)
Calcium, Ion: 1.09 mmol/L — ABNORMAL LOW (ref 1.12–1.23)
Chloride: 105 mmol/L (ref 101–111)
GLUCOSE: 92 mg/dL (ref 65–99)
HCT: 31 % — ABNORMAL LOW (ref 39.0–52.0)
HEMOGLOBIN: 10.5 g/dL — AB (ref 13.0–17.0)
POTASSIUM: 4.3 mmol/L (ref 3.5–5.1)
Sodium: 137 mmol/L (ref 135–145)
TCO2: 18 mmol/L (ref 0–100)

## 2015-02-28 LAB — CBC WITH DIFFERENTIAL/PLATELET
BASOS ABS: 0.1 10*3/uL (ref 0.0–0.1)
Basophils Relative: 1 % (ref 0–1)
EOS PCT: 3 % (ref 0–5)
Eosinophils Absolute: 0.3 10*3/uL (ref 0.0–0.7)
HCT: 25.4 % — ABNORMAL LOW (ref 39.0–52.0)
Hemoglobin: 8.7 g/dL — ABNORMAL LOW (ref 13.0–17.0)
LYMPHS PCT: 27 % (ref 12–46)
Lymphs Abs: 2.5 10*3/uL (ref 0.7–4.0)
MCH: 28.6 pg (ref 26.0–34.0)
MCHC: 34.3 g/dL (ref 30.0–36.0)
MCV: 83.6 fL (ref 78.0–100.0)
MONO ABS: 1.9 10*3/uL — AB (ref 0.1–1.0)
MONOS PCT: 20 % — AB (ref 3–12)
Neutro Abs: 4.7 10*3/uL (ref 1.7–7.7)
Neutrophils Relative %: 49 % (ref 43–77)
PLATELETS: 124 10*3/uL — AB (ref 150–400)
RBC: 3.04 MIL/uL — ABNORMAL LOW (ref 4.22–5.81)
RDW: 16.8 % — AB (ref 11.5–15.5)
WBC: 9.5 10*3/uL (ref 4.0–10.5)

## 2015-02-28 LAB — POC OCCULT BLOOD, ED: Fecal Occult Bld: POSITIVE — AB

## 2015-02-28 LAB — TYPE AND SCREEN
ABO/RH(D): O POS
Antibody Screen: NEGATIVE

## 2015-02-28 LAB — PROTIME-INR
INR: 2.86 — ABNORMAL HIGH (ref 0.00–1.49)
Prothrombin Time: 29.5 seconds — ABNORMAL HIGH (ref 11.6–15.2)

## 2015-02-28 LAB — I-STAT CG4 LACTIC ACID, ED: Lactic Acid, Venous: 3.02 mmol/L (ref 0.5–2.0)

## 2015-02-28 MED ORDER — SODIUM CHLORIDE 0.9 % IV SOLN: 1000.0000 mL | INTRAVENOUS | Status: DC

## 2015-02-28 MED ORDER — SODIUM CHLORIDE 0.9 % IV SOLN
1000.0000 mL | Freq: Once | INTRAVENOUS | Status: AC
  Administered 2015-02-28: 1000 mL via INTRAVENOUS

## 2015-02-28 NOTE — ED Provider Notes (Addendum)
CSN: [id number]     Arrival date & time 02/28/15  0233 History  This chart was scribed for Francisco Severin, MD by [name], ED Scribe. This patient was seen in room WA15/WA15 and the patient's care was started at 3:20 AM.     Chief Complaint  Patient presents with  . Rectal Bleeding   The history is provided by the patient. No language interpreter was used.    HPI Comments: Francisco [name] is a 49 y.o. male with Hx of hepatic cirrhosis, esophageal varices, chronic renal failure, CHF who presents to the Emergency Department complaining of moderate testicular swelling onset 2 days ago with associated abdominal distention, decreased urine and blood in the stool. No Hx of similar symptoms. Pt states that he was discharged from the hospital for liver failure and a GI bleed 2 days ago. Pt states he has been eating and consuming fluids normally since discharge. Pt states [facility]. He denies fever, SOB, abdominal pain.   Past Medical History  Diagnosis Date  . Hepatic cirrhosis   . Esophageal varices     last banding in [place] June 2016  . Chronic renal failure     was on hemodialysis in June 2016  . Congestive heart failure     questionable hx & on lasix  . Ascites    Past Surgical History  Procedure Laterality Date  . Esophagogastroduodenoscopy w/ banding    . Esophagogastroduodenoscopy N/A 02/06/2015    Procedure: ESOPHAGOGASTRODUODENOSCOPY (EGD);  Surgeon: Rachael Fee, MD;  Location: [facility];  Service: Endoscopy;  Laterality: N/A;  at bedside in ICU   Family History  Problem Relation Age of Onset  . COPD Mother   . Asthma Mother   . Leukemia Maternal Grandmother   . Congestive Heart Failure Maternal Grandfather   . Cancer Other     2 aunts with known ca   Social History  Substance Use Topics  . Smoking status: Former Smoker    Quit date: 10/02/2014  . Smokeless tobacco: None     Comment: off and on for 20 years  .  Alcohol Use: No     Comment: Quit drinking EtOH prior to prolonged admission in April 2016    Review of Systems  Constitutional: Negative for fever.  Respiratory: Negative for shortness of breath.   Gastrointestinal: Positive for blood in stool and abdominal distention. Negative for abdominal pain.  Genitourinary: Positive for decreased urine volume and scrotal swelling.  All other systems reviewed and are negative.  Allergies  Review of patient's allergies indicates no known allergies.  Home Medications   Prior to Admission medications   Medication Sig Start Date End Date Taking? Authorizing Provider  albuterol (PROVENTIL HFA;VENTOLIN HFA) 108 (90 BASE) MCG/ACT inhaler Inhale 1-2 puffs into the lungs every 6 (six) hours as needed for wheezing or shortness of breath.    Historical Provider, MD  Ca Carbonate-Mag Hydroxide (ROLAIDS) 550-110 MG CHEW Chew 1 tablet by mouth as needed (for heartburn).    Historical Provider, MD  feeding supplement, ENSURE ENLIVE, (ENSURE ENLIVE) LIQD Take 237 mLs by mouth 2 (two) times daily between meals. 02/26/15   Maryruth Bun [name], MD  furosemide (LASIX) 40 MG tablet Take 1 tablet (40 mg total) by mouth daily. 02/26/15   Maryruth Bun [name], MD  lactulose (CHRONULAC) 10 GM/15ML solution Take 45 mLs (30 g total) by mouth every 8 (eight) hours. 02/26/15   Christina P  [name], MD  magnesium oxide (MAG-OX) 400 (241.3 MG) MG tablet Take 1 tablet (400 mg total) by mouth 2 (two) times daily. 02/26/15   Maryruth Bun [name], MD  naproxen sodium (ANAPROX) 220 MG tablet Take 440 mg by mouth every 12 (twelve) hours as needed (for pain).    Historical Provider, MD  oxyCODONE (OXY IR/ROXICODONE) 5 MG immediate release tablet Take 1 tablet (5 mg total) by mouth every 4 (four) hours as needed for moderate pain. 02/26/15   Maryruth Bun [name], MD  potassium chloride SA (K-DUR,KLOR-CON) 20 MEQ tablet Take 1 tablet (20 mEq total) by mouth 3 (three) times daily. 02/26/15   Christina P [name], MD    BP 100/70 mmHg  Pulse 120  Temp(Src) 98.7 F (37.1 C) (Oral)  Resp 18  Ht 5\' 10"  (1.778 m)  SpO2 100% Physical Exam  Constitutional: He is oriented to person, place, and time. He appears well-developed and well-nourished.  HENT:  Head: Normocephalic and atraumatic.  Nose: Nose normal.  Mouth/Throat: Oropharynx is clear and moist.  Eyes: Conjunctivae and EOM are normal. Pupils are equal, round, and reactive to light.  Neck: Normal range of motion. Neck supple. No JVD present. No tracheal deviation present. No thyromegaly present.  Cardiovascular: Regular rhythm, normal heart sounds and intact distal pulses.  Exam reveals no gallop and no friction rub.   No murmur heard. Tachycardia noted  Pulmonary/Chest: Effort normal and breath sounds normal. No stridor. No respiratory distress. He has no wheezes. He has no rales. He exhibits no tenderness.  Abdominal: Soft. Bowel sounds are normal. He exhibits distension (patient has tense ascites). He exhibits no mass. There is no tenderness. There is no rebound and no guarding.  Genitourinary:  Scrotum is edematous  Musculoskeletal: Normal range of motion. He exhibits edema (2+ pitting edema of legs bilaterally to groin). He exhibits no tenderness.  Lymphadenopathy:    He has no cervical adenopathy.  Neurological: He is alert and oriented to person, place, and time. He displays normal reflexes. He exhibits normal muscle tone. Coordination normal.  Skin: Skin is warm and dry. No rash noted. No erythema. No pallor.  Psychiatric: He has a normal mood and affect. His behavior is normal. Judgment and thought content normal.  Nursing note and vitals reviewed.   ED Course  Procedures (including critical care time) DIAGNOSTIC STUDIES: Oxygen Saturation is 100% on RA, normal by my interpretation.    COORDINATION OF CARE: 3:28 AM Discussed treatment plan with pt at bedside and pt agreed to plan.   Labs Review Labs Reviewed  CBC WITH  DIFFERENTIAL/PLATELET - Abnormal; Notable for the following:    RBC 3.04 (*)    Hemoglobin 8.7 (*)    HCT 25.4 (*)    RDW 16.8 (*)    Platelets 124 (*)    Monocytes Relative 20 (*)    Monocytes Absolute 1.9 (*)    All other components within normal limits  PROTIME-INR - Abnormal; Notable for the following:    Prothrombin Time 29.5 (*)    INR 2.86 (*)    All other components within normal limits  COMPREHENSIVE METABOLIC PANEL - Abnormal; Notable for the following:    Sodium 134 (*)    CO2 20 (*)    Calcium 8.0 (*)    Albumin 1.9 (*)    AST 69 (*)    Total Bilirubin 2.5 (*)    All other components within normal limits  I-STAT CHEM 8, ED - Abnormal; Notable for the following:  BUN 4 (*)    Calcium, Ion 1.09 (*)    Hemoglobin 10.5 (*)    HCT 31.0 (*)    All other components within normal limits  I-STAT CG4 LACTIC ACID, ED - Abnormal; Notable for the following:    Lactic Acid, Venous 3.02 (*)    All other components within normal limits  POC OCCULT BLOOD, ED - Abnormal; Notable for the following:    Fecal Occult Bld POSITIVE (*)    All other components within normal limits  URINALYSIS, ROUTINE W REFLEX MICROSCOPIC (NOT AT [facility])  TYPE AND SCREEN    Imaging Review No results found. I have personally reviewed and evaluated these images and lab results as part of my medical decision-making.   EKG Interpretation None      MDM   Final diagnoses:  Ascites due to alcoholic cirrhosis  Bilateral lower extremity edema  Scrotal edema  Anemia, chronic disease    49 year old male with cirrhosis of the liver, end-stage liver disease who presents to the emergency department from home with complaint of bright red blood per rectum and worsening scrotal edema.  Patient was discharged 2 days ago after prolonged stay complicated by SBP.  He denies any fevers.  He reports currently he is thirsty.  He has been eating and drinking well at home.  He reports he is taking his medications as  prescribed.  Patient noted be tachycardic, prior vitals reviewed, and patient has been persistently tachycardic over the last several days.  Patient refused blood transfusion and repeat paracentesis on last admission.  I suspect his scrotal edema is secondary to his large ascites.  I have explained to him that without removing fluid from his abdomen and increasing his protein state I cannot fix his edema.  Plan for labs, rectal temp and reevaluation.   I personally performed the services described in this documentation, which was scribed in my presence. The recorded information has been reviewed and is accurate.  Pt again refused IR paracentesis here as he "didn't want to wait around all day without eating".  I will try to send a message to f/u doctor at [facility] to see if an outpatient tap can be done.  Pt is not dyspneic, abd is tight but he denies severe pain.  6:35 AM Mother now at the bedside.  She reports they are having a difficult time caring for him at home due to the swelling in his legs, overall weakness.  She reports a nurse did visit the home yesterday, but is not due to be back until Tuesday.  Mother is afraid he will fall as currently it is taking 3 people to move him.  Pt not eligible for SNF due to recent move from [place].  Will hold d/c, have case management/SW evaluate.    Francisco Severin, MD 02/28/15 [phone]  6:58 AM Will check UA.  Unable to contact SW at this time.  By d/c summary, he is supposed to have home health aid, but no answer until 8 am at [facility], [phone]    Francisco Severin, MD 02/28/15 0700

## 2015-02-28 NOTE — ED Provider Notes (Addendum)
Pt with complicated medical history including cirrhosis, sepsis, gi bleeding.   Recently discharged from the hospital. Seen by Dr Norlene Campbell in the AM.  Plan was for discharge.  Awaiting to speak with case management regarding further assistance with home health needs.  Vitals remain stable.  Linwood Dibbles, MD 02/28/15 [phone]  Discussed with family at the bedside about home health follow up.  Social worker is here as well and will see about possibility of donated wheelchair and additional assistance at home.  One issue is his insurance and pt does plan on trying to return to [place].  He hasn't tried to switch his insurance for that reason.  Stable for discharge.  Ua was not obtained.  Pt did not want a cath performed.  I explained to the family that with his elevated INR i would not be surprised if his urine looked bloody intermittently.  Linwood Dibbles, MD 02/28/15 [phone]

## 2015-02-28 NOTE — Discharge Instructions (Signed)
The swelling in your belly from your ascites and liver disease has caused the swelling in your lower legs and scrotum.  You have been recommended to have a therapeutic paracentesis done.  This can be done as an outpatient.  Elevating your legs and scrotum may help with swelling slightly.  This can be done with pillows.  Continue taking your medications as prescribed.  Return to the emergency department for shortness of breath, worsening abdominal swelling, weakness, or other new concerning symptoms.   Ascites Ascites is a gathering of fluid in the belly (abdomen). This is most often caused by liver disease. It may also be caused by a number of other less common problems. It causes a ballooning out (distension) of the abdomen. CAUSES  Scarring of the liver (cirrhosis) is the most common cause of ascites. Other causes include:  Infection or inflammation in the abdomen.  Cancer in the abdomen.  Heart failure.  Certain forms of kidney failure (nephritic syndrome).  Inflammation of the pancreas.  Clots in the veins of the liver. SYMPTOMS  In the early stages of ascites, you may not have any symptoms. The main symptom of ascites is a sense of abdominal bloating. This is due to the presence of fluid. This may also cause an increase in abdominal or waist size. People with this condition can develop swelling in the legs, and men can develop a swollen scrotum. When there is a lot of fluid, it may be hard to breath. Stretching of the abdomen by fluid can be painful. DIAGNOSIS  Certain features of your medical history, such as a history of liver disease and of an enlarging abdomen, can suggest the presence of ascites. The diagnosis of ascites can be made on physical exam by your caregiver. An abdominal ultrasound examination can confirm that ascites is present, and estimate the amount of fluid. Once ascites is confirmed, it is important to determine its cause. Again, a history of one of the conditions  listed in "CAUSES" provides a strong clue. A physical exam is important, and blood and X-ray tests may be needed. During a procedure called paracentesis, a sample of fluid is removed from the abdomen. This can determine certain key features about the fluid, such as whether or not infection or cancer is present. Your caregiver will determine if a paracentesis is necessary. They will describe the procedure to you. PREVENTION  Ascites is a complication of other conditions. Therefore to prevent ascites, you must seek treatment for any significant health conditions you have. Once ascites is present, careful attention to fluid and salt intake may help prevent it from getting worse. If you have ascites, you should not drink alcohol. PROGNOSIS  The prognosis of ascites depends on the underlying disease. If the disease is reversible, such as with certain infections or with heart failure, then ascites may improve or disappear. When ascites is caused by cirrhosis, then it indicates that the liver disease has worsened, and further evaluation and treatment of the liver disease is needed. If your ascites is caused by cancer, then the success or failure of the cancer treatment will determine whether your ascites will improve or worsen. RISKS AND COMPLICATIONS  Ascites is likely to worsen if it is not properly diagnosed and treated. A large amount of ascites can cause pain and difficulty breathing. The main complication, besides worsening, is infection (called spontaneous bacterial peritonitis). This requires prompt treatment. TREATMENT  The treatment of ascites depends on its cause. When liver disease is your cause, medical  management using water pills (diuretics) and decreasing salt intake is often effective. Ascites due to peritoneal inflammation or malignancy (cancer) alone does not respond to salt restriction and diuretics. Hospitalization is sometimes required. If the treatment of ascites cannot be managed with  medications, a number of other treatments are available. Your caregivers will help you decide which will work best for you. Some of these are:  Removal of fluid from the abdomen (paracentesis).  Fluid from the abdomen is passed into a vein (peritoneovenous shunting).  Liver transplantation.  Transjugular intrahepatic portosystemic stent shunt. HOME CARE INSTRUCTIONS  It is important to monitor body weight and the intake and output of fluids. Weigh yourself at the same time every day. Record your weights. Fluid restriction may be necessary. It is also important to know your salt intake. The more salt you take in, the more fluid you will retain. Ninety percent of people with ascites respond to this approach.  Follow any directions for medicines carefully.  Follow up with your caregiver, as directed.  Report any changes in your health, especially any new or worsening symptoms.  If your ascites is from liver disease, avoid alcohol and other substances toxic to the liver. SEEK MEDICAL CARE IF:   Your weight increases more than a few pounds in a few days.  Your abdominal or waist size increases.  You develop swelling in your legs.  You had swelling and it worsens. SEEK IMMEDIATE MEDICAL CARE IF:   You develop a fever.  You develop new abdominal pain.  You develop difficulty breathing.  You develop confusion.  You have bleeding from the mouth, stomach, or rectum. MAKE SURE YOU:   Understand these instructions.  Will watch your condition.  Will get help right away if you are not doing well or get worse. Document Released: 06/19/2005 Document Revised: 09/11/2011 Document Reviewed: 01/18/2007 [employer] Patient Information 2015 [place], [place]. This information is not intended to replace advice given to you by your health care provider. Make sure you discuss any questions you have with your health care provider.  Alcoholic Liver Disease Alcoholic liver disease means you have a  damaged liver that does not work properly. This disease is caused by drinking too much alcohol. Some people do not have any problems until the disease has become very bad. Problems can be worse after a period of heavy drinking. HOME CARE  Stop drinking alcohol.  Get expert advice or help (counseling) to quit drinking. Join an alchohol support group.  You may need to eat foods that give you energy (carbohydrates), such as:  Milk and yogurt.  Navy, pinto, and white beans.  Applesauce, grapes, dried dates, prunes, and raisins.  Potatoes and rice.  Eat foods that are high in vitamins, especially thiamine and folic acid. These foods include:  Whole-wheat or whole-grain breads and cereals. Look on the package for "added thiamine" or "added folic acid."  Meat, especially pork.  Fresh, raw vegetables.  Fresh fruits and vegetables, such as oranges, orange juice, avocados, beets, and cantaloupe.  Dark green, leafy vegetables, such as romaine lettuce, spinach, and broccoli.  Beans and nuts.  Dairy foods, such as milk, butter, yogurt, cheese, and ice cream. GET HELP RIGHT AWAY IF:   You have bright red blood in your poop (stool).  You cough or throw up (vomit) blood.  Your skin and eyes turn more yellow.  You have a bad headache or problems thinking.  You have trouble walking. MAKE SURE YOU:   Understand these instructions.  Will watch your condition.  Will get help right away if you are not doing well or get worse. Document Released: 04/16/2009 Document Revised: 09/11/2011 Document Reviewed: 04/16/2009 [facility] Patient Information 2015 [place], [place]. This information is not intended to replace advice given to you by your health care provider. Make sure you discuss any questions you have with your health care provider.  Cirrhosis Cirrhosis is a condition of scarring of the liver which is caused when the liver has tried repairing itself following damage. This damage may  come from a previous infection such as one of the forms of hepatitis (usually hepatitis C), or the damage may come from being injured by toxins. The main toxin that causes this damage is alcohol. The scarring of the liver from use of alcohol is irreversible. That means the liver cannot return to normal even though alcohol is not used any more. The main danger of hepatitis C infection is that it may cause long-lasting (chronic) liver disease, and this also may lead to cirrhosis. This complication is progressive and irreversible. CAUSES  Prior to available blood tests, hepatitis C could be contracted by blood transfusions. Since testing of blood has improved, this is now unlikely. This infection can also be contracted through intravenous drug use and the sharing of needles. It can also be contracted through sexual relationships. The injury caused by alcohol comes from too much use. It is not a few drinks that poison the liver, but years of misuse. Usually there will be some signs and symptoms early with scarring of the liver that suggest the development of better habits. Alcohol should never be used while using acetaminophen. A small dose of both taken together may cause irreversible damage to the liver. HOME CARE INSTRUCTIONS  There is no specific treatment for cirrhosis. However, there are things you can do to avoid making the condition worse.  Rest as needed.  Eat a well-balanced diet. Your caregiver can help you with suggestions.  Vitamin supplements including vitamins A, K, D, and thiamine can help.  A low-salt diet, water restriction, or diuretic medicine may be needed to reduce fluid retention.  Avoid alcohol. This can be extremely toxic if combined with acetaminophen.  Avoid drugs which are toxic to the liver. Some of these include isoniazid, methyldopa, acetaminophen, anabolic steroids (muscle-building drugs), erythromycin, and oral contraceptives (birth control pills). Check with your  caregiver to make sure medicines you are presently taking will not be harmful.  Periodic blood tests may be required. Follow your caregiver's advice regarding the timing of these.  Milk thistle is an herbal remedy which does protect the liver against toxins. However, it will not help once the liver has been scarred. SEEK MEDICAL CARE IF:  You have increasing fatigue or weakness.  You develop swelling of the hands, feet, legs, or face.  You vomit bright red blood, or a coffee ground appearing material.  You have blood in your stools, or the stools turn black and tarry.  You have a fever.  You develop loss of appetite, or have nausea and vomiting.  You develop jaundice.  You develop easy bruising or bleeding.  You have worsening of any of the problems you are concerned about. Document Released: 06/19/2005 Document Revised: 09/11/2011 Document Reviewed: 02/05/2008 [facility] Patient Information 2015 [place], [place]. This information is not intended to replace advice given to you by your health care provider. Make sure you discuss any questions you have with your health care provider.  Scrotal Swelling Scrotal swelling may occur  on one or both sides of the scrotum. Pain may also occur with swelling. Possible causes of scrotal swelling include:   Injury.  Infection.  An ingrown hair or abrasion in the area.  Repeated rubbing from tight-fitting underwear.  Poor hygiene.  A weakened area in the muscles around the groin (hernia). A hernia can allow abdominal contents to push into the scrotum.  Fluid around the testicle (hydrocele).  Enlarged vein around the testicle (varicocele).  Certain medical treatments or existing conditions.  A recent genital surgery or procedure.  The spermatic cord becomes twisted in the scrotum, which cuts off blood supply (testicular torsion).  Testicular cancer. HOME CARE INSTRUCTIONS Once the cause of your scrotal swelling has been determined,  you may be asked to monitor your scrotum for any changes. The following actions may help to alleviate any discomfort you are experiencing:  Rest and limit activity until the swelling goes away. Lying down is the preferred position.  Put ice on the scrotum:  Put ice in a plastic bag.  Place a towel between your skin and the bag.  Leave the ice on for 20 minutes, 2-3 times a day for 1-2 days.  Place a rolled towel under the testicles for support.  Wear loose-fitting clothing or an athletic support cup for comfort.  Take all medicines as directed by your health care provider.  Perform a monthly self-exam of the scrotum and penis. Feel for changes. Ask your health care provider how to perform a monthly self-exam if you are unsure. SEEK MEDICAL CARE IF:  You have a sudden (acute) onset of pain that is persistent and not improving.  You notice a heavy feeling or fluid in the scrotum.  You have pain or burning while urinating.  You have blood in the urine or semen.  You feel a lump around the testicle.  You notice that one testicle is larger than the other (slight variation is normal).  You have a persistent dull ache or pain in the groin or scrotum. SEEK IMMEDIATE MEDICAL CARE IF:  The pain does not go away or becomes severe.  You have a fever or shaking chills.  You have pain or vomiting that cannot be controlled.  You notice significant redness or swelling of one or both sides of the scrotum.  You experience redness spreading upward from your scrotum to your abdomen or downward from your scrotum to your thighs. MAKE SURE YOU:  Understand these instructions.  Will watch your condition.  Will get help right away if you are not doing well or get worse. Document Released: 07/22/2010 Document Revised: 02/19/2013 Document Reviewed: 11/21/2012 [facility] Patient Information 2015 [place], [place]. This information is not intended to replace advice given to you by your health  care provider. Make sure you discuss any questions you have with your health care provider.

## 2015-02-28 NOTE — Progress Notes (Signed)
8:57am. CSW met with pt and family along with MD. Pt is not eligible for SNF placement in [place] because he has out of state medicaid.Pt currently has [employer].  Pt and family expressed understanding of this and that they have been told this before. Pt expressed that he is hoping to move back to [place]. CSW advised that if pt is NOT moving back, they should start working on switching Medicaid over to [place] since many more services (including placement) would open up to them. CSW contacted CM to see if pt has been ordered SW with [facility] and if he is eligible for DME.   CSW to sign off but remains available for further consult if needed.  [place] Worker [facility] phone: [phone]

## 2015-02-28 NOTE — ED Notes (Signed)
Per EMS pt complains of rectal bleeding x1 day. Per EMS pt also states blood in urine.

## 2015-02-28 NOTE — Care Management (Signed)
CM spoke with patient at the bedside. Patient made aware of [facility] having to process the request for a wheelchair (charity care). Patient states he has seen the CSW from [facility] prior to the emergency room visit. Patient is currently active with [facility] (charity care). Physicist, medical BSN CCM

## 2015-02-28 NOTE — ED Notes (Signed)
Bed: ZO10 Expected date:  Expected time:  Means of arrival:  Comments: Rectal bleed/cirrhosis

## 2015-03-01 ENCOUNTER — Emergency Department (HOSPITAL_COMMUNITY): Payer: Medicaid - Out of State

## 2015-03-01 ENCOUNTER — Emergency Department (HOSPITAL_COMMUNITY)
Admission: EM | Admit: 2015-03-01 | Discharge: 2015-03-02 | Disposition: A | Discharge status: Home / Self Care | Payer: Medicaid - Out of State | Attending: Emergency Medicine | Admitting: Emergency Medicine

## 2015-03-01 ENCOUNTER — Encounter (HOSPITAL_COMMUNITY): Payer: Self-pay | Admitting: *Deleted

## 2015-03-01 DIAGNOSIS — I509 Heart failure, unspecified: Secondary | ICD-10-CM | POA: Insufficient documentation

## 2015-03-01 DIAGNOSIS — R188 Other ascites: Secondary | ICD-10-CM

## 2015-03-01 DIAGNOSIS — Z79899 Other long term (current) drug therapy: Secondary | ICD-10-CM | POA: Insufficient documentation

## 2015-03-01 DIAGNOSIS — R000 Tachycardia, unspecified: Secondary | ICD-10-CM | POA: Diagnosis not present

## 2015-03-01 DIAGNOSIS — R1907 Generalized intra-abdominal and pelvic swelling, mass and lump: Secondary | ICD-10-CM | POA: Diagnosis present

## 2015-03-01 DIAGNOSIS — Z87891 Personal history of nicotine dependence: Secondary | ICD-10-CM | POA: Insufficient documentation

## 2015-03-01 DIAGNOSIS — K7469 Other cirrhosis of liver: Secondary | ICD-10-CM | POA: Diagnosis not present

## 2015-03-01 LAB — COMPREHENSIVE METABOLIC PANEL
ALK PHOS: 97 U/L (ref 38–126)
ALT: 27 U/L (ref 17–63)
AST: 77 U/L — ABNORMAL HIGH (ref 15–41)
Albumin: 1.8 g/dL — ABNORMAL LOW (ref 3.5–5.0)
Anion gap: 4 — ABNORMAL LOW (ref 5–15)
BILIRUBIN TOTAL: 2.8 mg/dL — AB (ref 0.3–1.2)
BUN: <5 mg/dL — ABNORMAL LOW (ref 6–20)
CALCIUM: 8.1 mg/dL — AB (ref 8.9–10.3)
CO2: 21 mmol/L — ABNORMAL LOW (ref 22–32)
Chloride: 107 mmol/L (ref 101–111)
Creatinine, Ser: 1.27 mg/dL — ABNORMAL HIGH (ref 0.61–1.24)
GLUCOSE: 100 mg/dL — AB (ref 65–99)
POTASSIUM: 4.6 mmol/L (ref 3.5–5.1)
Sodium: 132 mmol/L — ABNORMAL LOW (ref 135–145)
TOTAL PROTEIN: 8.4 g/dL — AB (ref 6.5–8.1)

## 2015-03-01 LAB — CBC WITH DIFFERENTIAL/PLATELET
BASOS ABS: 0.2 10*3/uL — AB (ref 0.0–0.1)
BASOS PCT: 2 % — AB (ref 0–1)
Eosinophils Absolute: 0.2 10*3/uL (ref 0.0–0.7)
Eosinophils Relative: 2 % (ref 0–5)
HEMATOCRIT: 27.4 % — AB (ref 39.0–52.0)
HEMOGLOBIN: 9.4 g/dL — AB (ref 13.0–17.0)
LYMPHS ABS: 3.3 10*3/uL (ref 0.7–4.0)
LYMPHS PCT: 33 % (ref 12–46)
MCH: 28.5 pg (ref 26.0–34.0)
MCHC: 34.3 g/dL (ref 30.0–36.0)
MCV: 83 fL (ref 78.0–100.0)
MONOS PCT: 16 % — AB (ref 3–12)
Monocytes Absolute: 1.6 10*3/uL — ABNORMAL HIGH (ref 0.1–1.0)
NEUTROS ABS: 4.7 10*3/uL (ref 1.7–7.7)
Neutrophils Relative %: 47 % (ref 43–77)
Platelets: 125 10*3/uL — ABNORMAL LOW (ref 150–400)
RBC: 3.3 MIL/uL — ABNORMAL LOW (ref 4.22–5.81)
RDW: 16.5 % — AB (ref 11.5–15.5)
WBC: 10 10*3/uL (ref 4.0–10.5)

## 2015-03-01 LAB — AMMONIA: AMMONIA: 49 umol/L — AB (ref 9–35)

## 2015-03-01 LAB — PROTIME-INR
INR: 2.61 — ABNORMAL HIGH (ref 0.00–1.49)
PROTHROMBIN TIME: 27.5 s — AB (ref 11.6–15.2)

## 2015-03-01 LAB — I-STAT CG4 LACTIC ACID, ED: LACTIC ACID, VENOUS: 2.56 mmol/L — AB (ref 0.5–2.0)

## 2015-03-01 MED ORDER — MORPHINE SULFATE (PF) 4 MG/ML IV SOLN
4.0000 mg | Freq: Once | INTRAVENOUS | Status: AC
  Administered 2015-03-01: 4 mg via INTRAVENOUS
  Filled 2015-03-01 (×2): qty 1

## 2015-03-01 MED ORDER — LIDOCAINE HCL 2 % IJ SOLN
10.0000 mL | Freq: Once | INTRAMUSCULAR | Status: AC
  Administered 2015-03-01: 200 mg
  Filled 2015-03-01: qty 20

## 2015-03-01 MED ORDER — SODIUM CHLORIDE 0.9 % IV BOLUS (SEPSIS)
2000.0000 mL | Freq: Once | INTRAVENOUS | Status: AC
  Administered 2015-03-01: 2000 mL via INTRAVENOUS

## 2015-03-01 NOTE — ED Notes (Signed)
Pt. Was seen a few days ago at [facility] for the same scrotal edema. Pt states he hasnt seen any improvement in the sweeling and pt states he has been compliant with medications. Pt also has ascities and gets tapped frequently for it.

## 2015-03-02 LAB — I-STAT CG4 LACTIC ACID, ED: Lactic Acid, Venous: 2.26 mmol/L (ref 0.5–2.0)

## 2015-03-02 NOTE — ED Provider Notes (Signed)
CSN: [id number]     Arrival date & time 03/01/15  2026 History   First MD Initiated Contact with Patient 03/01/15 2114     Chief Complaint  Patient presents with  . Groin Swelling     (Consider location/radiation/quality/duration/timing/severity/associated sxs/prior Treatment) The history is provided by the patient.  Francisco [name] is a 49 y.o. male hx of cirrhosis, esophageal varices, here with abdominal swelling. Patient was admitted several weeks ago and discharged on 8/26. At that time, he was intubated, on pressors briefly and was thought to be septic from SBP and had GI bleeding. Since discharge, he noticed that abdomen swelled back up. Denies vomiting. Denies fevers. Denies chest pain or shortness of breath. Came to the ED yesterday and refused IR paracentesis because he doesn't want to wait for it. States that he has persistent groin swelling and abdominal swelling. Patient is actually visiting from [place] and has no doctors here.     Past Medical History  Diagnosis Date  . Hepatic cirrhosis   . Esophageal varices     last banding in [place] June 2016  . Chronic renal failure     was on hemodialysis in June 2016  . Congestive heart failure     questionable hx & on lasix  . Ascites    Past Surgical History  Procedure Laterality Date  . Esophagogastroduodenoscopy w/ banding    . Esophagogastroduodenoscopy N/A 02/06/2015    Procedure: ESOPHAGOGASTRODUODENOSCOPY (EGD);  Surgeon: Rachael Fee, MD;  Location: [facility];  Service: Endoscopy;  Laterality: N/A;  at bedside in ICU   Family History  Problem Relation Age of Onset  . COPD Mother   . Asthma Mother   . Leukemia Maternal Grandmother   . Congestive Heart Failure Maternal Grandfather   . Cancer Other     2 aunts with known ca   Social History  Substance Use Topics  . Smoking status: Former Smoker    Quit date: 10/02/2014  . Smokeless tobacco: Never Used     Comment: off and on for 20 years  . Alcohol Use: No      Comment: Quit drinking EtOH prior to prolonged admission in April 2016    Review of Systems  Gastrointestinal: Positive for abdominal pain and abdominal distention.  All other systems reviewed and are negative.     Allergies  Review of patient's allergies indicates no known allergies.  Home Medications   Prior to Admission medications   Medication Sig Start Date End Date Taking? Authorizing Provider  albuterol (PROVENTIL HFA;VENTOLIN HFA) 108 (90 BASE) MCG/ACT inhaler Inhale 1-2 puffs into the lungs every 6 (six) hours as needed for wheezing or shortness of breath.    Historical Provider, MD  Ca Carbonate-Mag Hydroxide (ROLAIDS) 550-110 MG CHEW Chew 1 tablet by mouth 3 (three) times daily as needed (for heartburn).     Historical Provider, MD  feeding supplement, ENSURE ENLIVE, (ENSURE ENLIVE) LIQD Take 237 mLs by mouth 2 (two) times daily between meals. 02/26/15   Maryruth Bun [name], MD  furosemide (LASIX) 40 MG tablet Take 1 tablet (40 mg total) by mouth daily. 02/26/15   Maryruth Bun [name], MD  lactulose (CHRONULAC) 10 GM/15ML solution Take 45 mLs (30 g total) by mouth every 8 (eight) hours. 02/26/15   Maryruth Bun [name], MD  magnesium oxide (MAG-OX) 400 (241.3 MG) MG tablet Take 1 tablet (400 mg total) by mouth 2 (two) times daily. 02/26/15   Maryruth Bun [name], MD  naproxen sodium (ANAPROX) 220 MG  tablet Take 220 mg by mouth 2 (two) times daily as needed (pain).    Historical Provider, MD  oxyCODONE (OXY IR/ROXICODONE) 5 MG immediate release tablet Take 1 tablet (5 mg total) by mouth every 4 (four) hours as needed for moderate pain. 02/26/15   Maryruth Bun [name], MD  potassium chloride SA (K-DUR,KLOR-CON) 20 MEQ tablet Take 1 tablet (20 mEq total) by mouth 3 (three) times daily. 02/26/15   Maryruth Bun [name], MD   BP 93/63 mmHg  Pulse 121  Temp(Src) 98.7 F (37.1 C) (Oral)  Resp 15  SpO2 100% Physical Exam  Constitutional: He is oriented to person, place, and time.  Chronically ill    HENT:  Head: Normocephalic.  Mouth/Throat: Oropharynx is clear and moist.  Eyes: Conjunctivae are normal. Pupils are equal, round, and reactive to light.  Neck: Normal range of motion. Neck supple.  Cardiovascular: Regular rhythm and normal heart sounds.   tachy  Pulmonary/Chest: Effort normal and breath sounds normal. No respiratory distress. He has no wheezes. He has no rales.  Abdominal:  Firm, difficult to appreciate bowel sounds. Has fluid wave. Mild diffuse tenderness.   Genitourinary:  Scrotal edema, testicles nontender   Musculoskeletal:  2+ edema bilaterally  Neurological: He is alert and oriented to person, place, and time. No cranial nerve deficit.  Skin: Skin is warm and dry.  Psychiatric: He has a normal mood and affect. His behavior is normal. Judgment normal.  Nursing note and vitals reviewed.   ED Course  PARACENTESIS Date/Time: 03/02/2015 11:15 AM Performed by: Richardean Canal Authorized by: Richardean Canal Consent: Verbal consent obtained. Risks and benefits: risks, benefits and alternatives were discussed Consent given by: patient Patient understanding: patient states understanding of the procedure being performed Patient consent: the patient's understanding of the procedure matches consent given Procedure consent: procedure consent matches procedure scheduled Relevant documents: relevant documents present and verified Test results: test results available and properly labeled Required items: required blood products, implants, devices, and special equipment available Patient identity confirmed: verbally with patient Initial or subsequent exam: initial Procedure purpose: therapeutic Indications: abdominal discomfort secondary to ascites Anesthesia: local infiltration Local anesthetic: lidocaine 2% without epinephrine Anesthetic total: 5 ml Patient sedated: no Preparation: Patient was prepped and draped in the usual sterile fashion. Needle gauge: 18 Ultrasound  guidance: no Puncture site: right lower quadrant Fluid removed: 5000(ml) Fluid appearance: clear Dressing: 4x4 sterile gauze Patient tolerance: Patient tolerated the procedure well with no immediate complications   (including critical care time)  EMERGENCY DEPARTMENT [place] FAST EXAM  INDICATIONS:Tachycardia  PERFORMED BY: Myself  IMAGES ARCHIVED?: No  FINDINGS: All views positive  LIMITATIONS:  Body habitus  INTERPRETATION:  Abdominal free fluid present  COMMENT:  Known ascites, + free fluid    Labs Review Labs Reviewed  CBC WITH DIFFERENTIAL/PLATELET - Abnormal; Notable for the following:    RBC 3.30 (*)    Hemoglobin 9.4 (*)    HCT 27.4 (*)    RDW 16.5 (*)    Platelets 125 (*)    Monocytes Relative 16 (*)    Basophils Relative 2 (*)    Monocytes Absolute 1.6 (*)    Basophils Absolute 0.2 (*)    All other components within normal limits  COMPREHENSIVE METABOLIC PANEL - Abnormal; Notable for the following:    Sodium 132 (*)    CO2 21 (*)    Glucose, Bld 100 (*)    BUN <5 (*)    Creatinine, Ser 1.27 (*)  Calcium 8.1 (*)    Total Protein 8.4 (*)    Albumin 1.8 (*)    AST 77 (*)    Total Bilirubin 2.8 (*)    Anion gap 4 (*)    All other components within normal limits  AMMONIA - Abnormal; Notable for the following:    Ammonia 49 (*)    All other components within normal limits  PROTIME-INR - Abnormal; Notable for the following:    Prothrombin Time 27.5 (*)    INR 2.61 (*)    All other components within normal limits  I-STAT CG4 LACTIC ACID, ED - Abnormal; Notable for the following:    Lactic Acid, Venous 2.56 (*)    All other components within normal limits  CULTURE, BLOOD (ROUTINE X 2)  CULTURE, BLOOD (ROUTINE X 2)  I-STAT CG4 LACTIC ACID, ED    Imaging Review Dg Chest Port 1 View  03/01/2015   CLINICAL DATA:  Acute onset of scrotal edema, shortness of breath and chest pain. Initial encounter.  EXAM: PORTABLE CHEST - 1 VIEW  COMPARISON:  Chest  radiograph performed 02/20/2015  FINDINGS: The lungs remain hypoexpanded. Vascular congestion and vascular crowding are noted. Patchy opacities within the right lung have improved. No pleural effusion or pneumothorax is seen.  The cardiomediastinal silhouette is borderline normal in size. No acute osseous abnormalities are identified.  IMPRESSION: Lungs hypoexpanded. Vascular congestion noted. Patchy opacities within the right lung have improved, which may reflect residual edema or pneumonia.   Electronically Signed   By: Roanna Raider M.D.   On: 03/01/2015 22:51   I have personally reviewed and evaluated these images and lab results as part of my medical decision-making.   EKG Interpretation None      MDM   Final diagnoses:  None    Francisco [name] is a 50 y.o. male here with ab pain and swelling. Was admitted recently for SBP. But denies fever. He was persistently tachy in 120s on discharge from hospital several days ago. He appears to have significant abdominal swelling likely from ascites. Will repeat labs, culture. Will do bedside US for ascites.  11: 30pm Bedside US confirmed large amount of ascites. I performed paracentesis and took out 5 L clear fluid.   12: 15 AM Repeat abdominal exam now soft, nontender. No signs of SBP. WBC nl. Lactate went from 2.6 to 2.5 after IVF. LFTs, INR, and ammonia at baseline. Tachycardia stable. BP slightly low 90s after paracentesis, which is expected, and improved to low 100s on discahrge. Not hypoxic, CXR clear. He is chronically ill from end stage cirrhosis but doesn't meet criteria for admission. Stable for discharge. Gave him f/u with wellness clinic.     Richardean Canal, MD 03/02/15 [phone]

## 2015-03-02 NOTE — ED Notes (Signed)
Pt. Left with all belongings. Discharge instructions were reviewed and all questions were answered. Pt left with [facility]

## 2015-03-02 NOTE — Discharge Instructions (Signed)
Continue current medications.   See Wellness center for follow up.  Return to ER if you have worse abdominal swelling, chest pain, trouble breathing, fevers, vomiting.

## 2015-03-02 NOTE — ED Notes (Signed)
[facility] CALLED .

## 2015-03-03 ENCOUNTER — Ambulatory Visit (HOSPITAL_COMMUNITY)
Admission: RE | Admit: 2015-03-03 | Discharge: 2015-03-03 | Disposition: A | Discharge status: Home / Self Care | Payer: Medicaid - Out of State | Source: Ambulatory Visit | Attending: Family Medicine | Admitting: Family Medicine

## 2015-03-03 ENCOUNTER — Ambulatory Visit (HOSPITAL_BASED_OUTPATIENT_CLINIC_OR_DEPARTMENT_OTHER): Payer: Medicaid - Out of State | Admitting: Family Medicine

## 2015-03-03 ENCOUNTER — Ambulatory Visit: Payer: PRIVATE HEALTH INSURANCE | Admitting: Family Medicine

## 2015-03-03 ENCOUNTER — Encounter: Payer: Self-pay | Admitting: Family Medicine

## 2015-03-03 VITALS — BP 96/68 | HR 80 | Temp 98.1°F

## 2015-03-03 DIAGNOSIS — R601 Generalized edema: Secondary | ICD-10-CM

## 2015-03-03 DIAGNOSIS — B3789 Other sites of candidiasis: Secondary | ICD-10-CM | POA: Diagnosis not present

## 2015-03-03 DIAGNOSIS — K7031 Alcoholic cirrhosis of liver with ascites: Secondary | ICD-10-CM | POA: Diagnosis present

## 2015-03-03 DIAGNOSIS — I950 Idiopathic hypotension: Secondary | ICD-10-CM | POA: Diagnosis not present

## 2015-03-03 MED ORDER — LIDOCAINE HCL (PF) 1 % IJ SOLN
INTRAMUSCULAR | Status: AC
  Filled 2015-03-03: qty 10

## 2015-03-03 MED ORDER — SPIRONOLACTONE 25 MG PO TABS: 25.0000 mg | ORAL_TABLET | Freq: Every day | ORAL | Status: DC

## 2015-03-03 MED ORDER — FUROSEMIDE 40 MG PO TABS: 20.0000 mg | ORAL_TABLET | Freq: Two times a day (BID) | ORAL | Status: AC

## 2015-03-03 MED ORDER — ALBUTEROL SULFATE HFA 108 (90 BASE) MCG/ACT IN AERS: 1.0000 | INHALATION_SPRAY | Freq: Four times a day (QID) | RESPIRATORY_TRACT | Status: AC | PRN

## 2015-03-03 MED ORDER — NYSTATIN 100000 UNIT/GM EX POWD: 1.0000 g | Freq: Three times a day (TID) | CUTANEOUS | Status: DC

## 2015-03-03 MED ORDER — TRAMADOL HCL 50 MG PO TABS: 50.0000 mg | ORAL_TABLET | Freq: Two times a day (BID) | ORAL | Status: DC | PRN

## 2015-03-03 NOTE — Procedures (Signed)
Successful US guided paracentesis from left lower quadrant.  Yielded 4 liters of clear yellow fluid.  No immediate complications.  Pt tolerated well.   Specimen was not sent for labs.  Arlander Gillen S Carie Kapuscinski PA-C 03/03/2015 2:21 PM

## 2015-03-03 NOTE — Progress Notes (Signed)
Patient is from [place] and came to visit family and has been unable to return since coming due to illness Patient arrived via EMS on a stretcher from home and states he is unable to ambulate He was diagnosed with cirrhosis in December 2015 according to patient and had 3 paracentesis procedures since He is complaining of bilateral groin pain 8/10 that began once the ascites began He reports no drinking since April and is not smoking or using drugs currently

## 2015-03-03 NOTE — Patient Instructions (Signed)
Cirrhosis  Cirrhosis is a condition of scarring of the liver which is caused when the liver has tried repairing itself following damage. This damage may come from a previous infection such as one of the forms of hepatitis (usually hepatitis C), or the damage may come from being injured by toxins. The main toxin that causes this damage is alcohol. The scarring of the liver from use of alcohol is irreversible. That means the liver cannot return to normal even though alcohol is not used any more. The main danger of hepatitis C infection is that it may cause long-lasting (chronic) liver disease, and this also may lead to cirrhosis. This complication is progressive and irreversible.  CAUSES   Prior to available blood tests, hepatitis C could be contracted by blood transfusions. Since testing of blood has improved, this is now unlikely. This infection can also be contracted through intravenous drug use and the sharing of needles. It can also be contracted through sexual relationships. The injury caused by alcohol comes from too much use. It is not a few drinks that poison the liver, but years of misuse. Usually there will be some signs and symptoms early with scarring of the liver that suggest the development of better habits. Alcohol should never be used while using acetaminophen. A small dose of both taken together may cause irreversible damage to the liver.  HOME CARE INSTRUCTIONS   There is no specific treatment for cirrhosis. However, there are things you can do to avoid making the condition worse.  · Rest as needed.  · Eat a well-balanced diet. Your caregiver can help you with suggestions.  · Vitamin supplements including vitamins A, K, D, and thiamine can help.  · A low-salt diet, water restriction, or diuretic medicine may be needed to reduce fluid retention.  · Avoid alcohol. This can be extremely toxic if combined with acetaminophen.  · Avoid drugs which are toxic to the liver. Some of these include isoniazid,  methyldopa, acetaminophen, anabolic steroids (muscle-building drugs), erythromycin, and oral contraceptives (birth control pills). Check with your caregiver to make sure medicines you are presently taking will not be harmful.  · Periodic blood tests may be required. Follow your caregiver's advice regarding the timing of these.  · Milk thistle is an herbal remedy which does protect the liver against toxins. However, it will not help once the liver has been scarred.  SEEK MEDICAL CARE IF:  · You have increasing fatigue or weakness.  · You develop swelling of the hands, feet, legs, or face.  · You vomit bright red blood, or a coffee ground appearing material.  · You have blood in your stools, or the stools turn black and tarry.  · You have a fever.  · You develop loss of appetite, or have nausea and vomiting.  · You develop jaundice.  · You develop easy bruising or bleeding.  · You have worsening of any of the problems you are concerned about.  Document Released: 06/19/2005 Document Revised: 09/11/2011 Document Reviewed: 02/05/2008  ExitCare® Patient Information ©2015 [employer]. This information is not intended to replace advice given to you by your health care provider. Make sure you discuss any questions you have with your health care provider.

## 2015-03-04 NOTE — Progress Notes (Signed)
Francisco [name], is a 49 y.o. male  ZOX:[id number]  [id number]:[id number]  DOB - Mar 23, 1966  CC:  Chief Complaint  Patient presents with  . Establish Care       HPI: Francisco [name] is a 49 y.o. male resident of [place] diagnosed with liver cirrhosis (diagnosed in 06/2014) and esophageal varices (s/p banding) who was recently seen at the ED on 03/02/15 for abdominal and groin swelling and shortness of breath; he underwent an ultrasound-guided paracentesis with removal of 5 L of clear fluid. Prior to this presentation he was admitted at [facility] from 02/06/15-02/26/15 where he was managed for sepsis, hypotension/shock secondary to spontaneous bacterial peritonitis and GI bleed, and had to be on pressors, encephalopathy requiring ventilator support and ICU care. He underwent 3.8 L paracentesis on 02/07/15 and again 2L on 02/12/15 and subsequently refused further paracentesis. He had a 2-D echo finding which was concerning for vegetation on the aortic valve but a repeat 2 weeks later was normal.  He [facility] and reports that his blood pressures have been on the low side with systolic as low as 70. Complains of the scrotal rash which is burning and painful and also itchy and is unsure of the duration. Accompanied by his spouse today and was brought in by EMS lying on a stretcher.  Spouse requests prescription for a manual wheelchair.  No Known Allergies Past Medical History  Diagnosis Date  . Hepatic cirrhosis   . Esophageal varices     last banding in [place] June 2016  . Chronic renal failure     was on hemodialysis in June 2016  . Congestive heart failure     questionable hx & on lasix  . Ascites    Current Outpatient Prescriptions on File Prior to Visit  Medication Sig Dispense Refill  . Ca Carbonate-Mag Hydroxide (ROLAIDS) 550-110 MG CHEW Chew 1 tablet by mouth 3 (three) times daily as needed (for heartburn).     . feeding supplement, ENSURE  ENLIVE, (ENSURE ENLIVE) LIQD Take 237 mLs by mouth 2 (two) times daily between meals. 237 mL 12  . lactulose (CHRONULAC) 10 GM/15ML solution Take 45 mLs (30 g total) by mouth every 8 (eight) hours. 240 mL 3  . magnesium oxide (MAG-OX) 400 (241.3 MG) MG tablet Take 1 tablet (400 mg total) by mouth 2 (two) times daily. 60 tablet 3  . oxyCODONE (OXY IR/ROXICODONE) 5 MG immediate release tablet Take 1 tablet (5 mg total) by mouth every 4 (four) hours as needed for moderate pain. 30 tablet 0  . potassium chloride SA (K-DUR,KLOR-CON) 20 MEQ tablet Take 1 tablet (20 mEq total) by mouth 3 (three) times daily. 90 tablet 3   No current facility-administered medications on file prior to visit.   Family History  Problem Relation Age of Onset  . COPD Mother   . Asthma Mother   . Leukemia Maternal Grandmother   . Congestive Heart Failure Maternal Grandfather   . Cancer Other     2 aunts with known ca   Social History   Social History  . Marital Status: Married    Spouse Name: N/A  . Number of Children: N/A  . Years of Education: N/A   Occupational History  . Not on file.   Social History Main Topics  . Smoking status: Former Smoker    Quit date: 10/02/2014  . Smokeless tobacco: Never Used     Comment: off and on for 20 years  .  Alcohol Use: No     Comment: Quit drinking EtOH prior to prolonged admission in April 2016  . Drug Use: No  . Sexual Activity: Not on file   Other Topics Concern  . Not on file   Social History Narrative   Patient is from [place]. Hospitalized with variceal bleed and banding this Spring and discharged to [facility] June 2016. Discharged from SNF 1 week ago. Previously worked as a Paediatric nurse. Married.    Review of Systems: Constitutional: Negative for fever, chills, diaphoresis,increased weight due to edema HENT: Negative for ear pain, nosebleeds, congestion, facial swelling, rhinorrhea, neck pain, neck stiffness and ear discharge.  Eyes: Negative for pain, discharge,  redness, itching and visual disturbance. Respiratory: Negative for cough, choking, chest tightness, positive for shortness of breath.  Cardiovascular: Negative for chest pain, palpitations, positive for leg swelling. Gastrointestinal:  Positive for abdominal distention. Genitourinary: Negative for dysuria, urgency, frequency, hematuria, flank pain, decreased urine volume, difficulty urinating and dyspareunia.  Musculoskeletal: Negative for joint pain. Neurological: Negative for dizziness, tremors, seizures  Hematological: Negative for adenopathy. Does not bruise/bleed easily. Skin: Positive for rash. Psychiatric/Behavioral: Negative for hallucinations, behavioral problems, confusion, dysphoric mood, decreased concentration and agitation.    Objective:   Filed Vitals:   03/03/15 1126  BP: 96/68  Pulse: 80  Temp: 98.1 F (36.7 C)    Physical Exam: Constitutional: Patient appears chronically ill looking, mildly dyspneic lying on a stretcher HENT: Normocephalic, atraumatic, External right and left ear normal. Oropharynx is clear and moist.  Eyes: Conjunctivae and EOM are normal. PERRLA,  scleral icterus present Neck: Normal ROM, No JVD. No tracheal deviation. No thyromegaly. CVS: RRR, S1/S2 +, no murmurs, no gallops, no carotid bruit.  Pulmonary: Effort and breath sounds normal, no stridor, rhonchi, wheezes, rales.  Abdominal: massive ascites with fluid thrill Musculoskeletal: Significant edema   Genito-Urinary: scrotal edema, presence of candida rash on scrotum Lymphadenopathy: No lymphadenopathy noted, cervical, inguinal or axillary Neuro: Alert. Normal reflexes, Skin: Skin is warm and dry. Psychiatric: Normal mood and affect. Behavior, judgment, thought content normal.  Lab Results  Component Value Date   WBC 10.0 03/01/2015   HGB 9.4* 03/01/2015   HCT 27.4* 03/01/2015   MCV 83.0 03/01/2015   PLT 125* 03/01/2015   Lab Results  Component Value Date   CREATININE 1.27*  03/01/2015   BUN <5* 03/01/2015   NA 132* 03/01/2015   K 4.6 03/01/2015   CL 107 03/01/2015   CO2 21* 03/01/2015    No results found for: HGBA1C Lipid Panel     Component Value Date/Time   TRIG 75 02/08/2015 0432       Assessment and plan:  49 year old male with a history of esophageal varices s/p banding, decompensated liver cirrhosis here for to establish care.  Decompensated Liver Cirrhosis with Ascites: MELD score 23   S/p paracentesis 2 days ago with rapid re accumulation Referred for ultrasound guided paracentesis; radiology department called to schedule this Low dose spironolactone added Referred to [facility] contacted after discussion with the patient and his family; I have also explained to him that he is not in a state to travel back to [place].  Anasarca:  Secondary to liver cirrhosis  Hypotension: Dose of Lasix reduced BP will be monitored by home health nurse from [facility]. Will see back in one week  Candida rash of groin: Placed on topical antifungal Advised to allow proper aeration    Greater than half of the time spent  in coordinating his care.  The patient was given clear instructions to go to ER or return to medical center if symptoms don't improve, worsen or new problems develop. The patient verbalized understanding. The patient was told to call to get lab results if they haven't heard anything in the next week.     Jaclyn Shaggy, MD. [facility] [phone] 03/04/2015, 1:20 PM

## 2015-03-06 LAB — CULTURE, BLOOD (ROUTINE X 2): CULTURE: NO GROWTH

## 2015-03-10 ENCOUNTER — Encounter (HOSPITAL_BASED_OUTPATIENT_CLINIC_OR_DEPARTMENT_OTHER): Payer: PRIVATE HEALTH INSURANCE | Admitting: Clinical

## 2015-03-10 ENCOUNTER — Encounter: Payer: Self-pay | Admitting: Family Medicine

## 2015-03-10 ENCOUNTER — Ambulatory Visit: Payer: PRIVATE HEALTH INSURANCE | Attending: Family Medicine | Admitting: Family Medicine

## 2015-03-10 VITALS — BP 102/78 | HR 112 | Temp 97.9°F

## 2015-03-10 DIAGNOSIS — R601 Generalized edema: Secondary | ICD-10-CM

## 2015-03-10 DIAGNOSIS — Z598 Other problems related to housing and economic circumstances: Secondary | ICD-10-CM

## 2015-03-10 DIAGNOSIS — I959 Hypotension, unspecified: Secondary | ICD-10-CM | POA: Insufficient documentation

## 2015-03-10 DIAGNOSIS — I950 Idiopathic hypotension: Secondary | ICD-10-CM | POA: Diagnosis not present

## 2015-03-10 DIAGNOSIS — B3749 Other urogenital candidiasis: Secondary | ICD-10-CM

## 2015-03-10 DIAGNOSIS — K7031 Alcoholic cirrhosis of liver with ascites: Secondary | ICD-10-CM | POA: Diagnosis present

## 2015-03-10 DIAGNOSIS — Z5989 Other problems related to housing and economic circumstances: Secondary | ICD-10-CM

## 2015-03-10 MED ORDER — NYSTATIN 100000 UNIT/GM EX POWD: 1.0000 | Freq: Three times a day (TID) | CUTANEOUS | Status: DC

## 2015-03-10 MED ORDER — SPIRONOLACTONE 25 MG PO TABS: 25.0000 mg | ORAL_TABLET | Freq: Every day | ORAL | Status: DC

## 2015-03-10 MED ORDER — NYSTATIN 100000 UNIT/GM EX POWD: 1.0000 g | Freq: Three times a day (TID) | CUTANEOUS | Status: DC

## 2015-03-10 NOTE — Progress Notes (Signed)
ASSESSMENT: Pt currently uninsured in [place] and would benefit from explanation of insurance options to obtain necessary health care in [place].  Stage of Change: precontemplative  PLAN: 1. F/U with behavioral health consultant in as needed 2. Psychiatric Medications: none. 3. Behavioral recommendation(s):   -Consider going back to [place], where he has full Medicaid coverage -Consider that [place] may not approve Medicaid  SUBJECTIVE: Pt. referred by Dr Venetia Night for explanation of [place] insurance:  Pt. reports the following symptoms/concerns: Pt states that he wants to have physical therapy to walk again, and that he has [place] that he is having transferred to [facility]. Pt does not want to go back to [place], and says he wants to transfer his insurance to [place], and says he is aware that it may take a long time and/or may not be approved for [place].  Duration of problem: week Severity: severe  OBJECTIVE: Orientation & Cognition: Oriented x3. Thought processes normal and appropriate to situation. Mood: appropriate. Affect: appropriate Appearance: appropriate Risk of harm to self or others: no risk of harm to self or others Substance use: none assessed Assessments administered: none  Diagnosis: Uninsured CPT Code: Z65.8 -------------------------------------------- Other(s) present in the room: wife, sister  Time spent with patient in exam room: 20 minutes

## 2015-03-10 NOTE — Patient Instructions (Signed)
Cirrhosis  Cirrhosis is a condition of scarring of the liver which is caused when the liver has tried repairing itself following damage. This damage may come from a previous infection such as one of the forms of hepatitis (usually hepatitis C), or the damage may come from being injured by toxins. The main toxin that causes this damage is alcohol. The scarring of the liver from use of alcohol is irreversible. That means the liver cannot return to normal even though alcohol is not used any more. The main danger of hepatitis C infection is that it may cause long-lasting (chronic) liver disease, and this also may lead to cirrhosis. This complication is progressive and irreversible.  CAUSES   Prior to available blood tests, hepatitis C could be contracted by blood transfusions. Since testing of blood has improved, this is now unlikely. This infection can also be contracted through intravenous drug use and the sharing of needles. It can also be contracted through sexual relationships. The injury caused by alcohol comes from too much use. It is not a few drinks that poison the liver, but years of misuse. Usually there will be some signs and symptoms early with scarring of the liver that suggest the development of better habits. Alcohol should never be used while using acetaminophen. A small dose of both taken together may cause irreversible damage to the liver.  HOME CARE INSTRUCTIONS   There is no specific treatment for cirrhosis. However, there are things you can do to avoid making the condition worse.  · Rest as needed.  · Eat a well-balanced diet. Your caregiver can help you with suggestions.  · Vitamin supplements including vitamins A, K, D, and thiamine can help.  · A low-salt diet, water restriction, or diuretic medicine may be needed to reduce fluid retention.  · Avoid alcohol. This can be extremely toxic if combined with acetaminophen.  · Avoid drugs which are toxic to the liver. Some of these include isoniazid,  methyldopa, acetaminophen, anabolic steroids (muscle-building drugs), erythromycin, and oral contraceptives (birth control pills). Check with your caregiver to make sure medicines you are presently taking will not be harmful.  · Periodic blood tests may be required. Follow your caregiver's advice regarding the timing of these.  · Milk thistle is an herbal remedy which does protect the liver against toxins. However, it will not help once the liver has been scarred.  SEEK MEDICAL CARE IF:  · You have increasing fatigue or weakness.  · You develop swelling of the hands, feet, legs, or face.  · You vomit bright red blood, or a coffee ground appearing material.  · You have blood in your stools, or the stools turn black and tarry.  · You have a fever.  · You develop loss of appetite, or have nausea and vomiting.  · You develop jaundice.  · You develop easy bruising or bleeding.  · You have worsening of any of the problems you are concerned about.  Document Released: 06/19/2005 Document Revised: 09/11/2011 Document Reviewed: 02/05/2008  ExitCare® Patient Information ©2015 [employer]. This information is not intended to replace advice given to you by your health care provider. Make sure you discuss any questions you have with your health care provider.

## 2015-03-10 NOTE — Progress Notes (Signed)
Patient is here for follow up  Hospice came out for consult Patient is wanting to participate in physical therapy so that "I can walk better." Hospice explained that they would have a PT see him once for assessment but that is it They explained that he could go back with [facility] if he wants to go that route RN asked patient what he wanted to do and he said he wanted the MD opinion today

## 2015-03-10 NOTE — Progress Notes (Signed)
Subjective:    Patient ID: Francisco [name], male    DOB: August 16, 1965, 49 y.o.   MRN: [id number]  HPI Francisco [name] is a 49 year old male resident of [place] diagnosed with liver cirrhosis (diagnosed in 06/2014) and esophageal varices (s/p banding) who was recently seen at the ED on 03/02/15 for abdominal and groin swelling and shortness of breath; he underwent an ultrasound-guided paracentesis with removal of 5 L of clear fluid. Prior to this presentation he was admitted at [facility] from 02/06/15-02/26/15 where he was managed for sepsis, hypotension/shock secondary to spontaneous bacterial peritonitis and GI bleed, and had to be on pressors, encephalopathy requiring ventilator support and ICU care. He underwent 3.8 L paracentesis on 02/07/15 and again 2L on 02/12/15 and subsequently refused further paracentesis. He had a 2-D echo finding which was concerning for vegetation on the aortic valve but a repeat 2 weeks later was normal. Last week i had referred him from the clinic to have 4L ultrasound guided paracentesis and he states the fluid has re accumulated again.  [facility];  His blood pressures have improved since his Lasix dose was decreased at his last office visit however he is yet to pick up the spironolactone prescription which was prescribed last week.  He does have a scrotal rash which is burning and painful and also itchy  For which he is using a topical antifungal prescribed at his last visit [facility] was called to see the patient who declined services because he would like to undergo PT "to be able to walk again". He last walked a month ago.  Past Medical History  Diagnosis Date  . Hepatic cirrhosis   . Esophageal varices     last banding in [place] June 2016  . Chronic renal failure     was on hemodialysis in June 2016  . Congestive heart failure     questionable hx & on lasix  . Ascites     Past Surgical History    Procedure Laterality Date  . Esophagogastroduodenoscopy w/ banding    . Esophagogastroduodenoscopy N/A 02/06/2015    Procedure: ESOPHAGOGASTRODUODENOSCOPY (EGD);  Surgeon: Rachael Fee, MD;  Location: [facility];  Service: Endoscopy;  Laterality: N/A;  at bedside in ICU    Social History   Social History  . Marital Status: Married    Spouse Name: N/A  . Number of Children: N/A  . Years of Education: N/A   Occupational History  . Not on file.   Social History Main Topics  . Smoking status: Former Smoker    Quit date: 10/02/2014  . Smokeless tobacco: Never Used     Comment: off and on for 20 years  . Alcohol Use: No     Comment: Quit drinking EtOH prior to prolonged admission in April 2016  . Drug Use: No  . Sexual Activity: Not on file   Other Topics Concern  . Not on file   Social History Narrative   [place] is from [place] Hospitalized with variceal bleed and banding this Spring and discharged to [facility] June 2016. Discharged from SNF 1 week ago. Previously worked as a Paediatric nurse. Married.    No Known Allergies  Current Outpatient Prescriptions on File Prior to Visit  Medication Sig Dispense Refill  . albuterol (PROVENTIL HFA;VENTOLIN HFA) 108 (90 BASE) MCG/ACT inhaler Inhale 1-2 puffs into the lungs every 6 (six) hours as needed for wheezing or shortness of breath. 1 each  2  . Ca Carbonate-Mag Hydroxide (ROLAIDS) 550-110 MG CHEW Chew 1 tablet by mouth 3 (three) times daily as needed (for heartburn).     . feeding supplement, ENSURE ENLIVE, (ENSURE ENLIVE) LIQD Take 237 mLs by mouth 2 (two) times daily between meals. 237 mL 12  . furosemide (LASIX) 40 MG tablet Take 0.5 tablets (20 mg total) by mouth 2 (two) times daily. 60 tablet 2  . lactulose (CHRONULAC) 10 GM/15ML solution Take 45 mLs (30 g total) by mouth every 8 (eight) hours. 240 mL 3  . magnesium oxide (MAG-OX) 400 (241.3 MG) MG tablet Take 1 tablet (400 mg total) by mouth 2 (two) times daily. 60 tablet 3  . oxyCODONE  (OXY IR/ROXICODONE) 5 MG immediate release tablet Take 1 tablet (5 mg total) by mouth every 4 (four) hours as needed for moderate pain. 30 tablet 0  . potassium chloride SA (K-DUR,KLOR-CON) 20 MEQ tablet Take 1 tablet (20 mEq total) by mouth 3 (three) times daily. 90 tablet 3  . traMADol (ULTRAM) 50 MG tablet Take 1 tablet (50 mg total) by mouth every 12 (twelve) hours as needed. 30 tablet 0   No current facility-administered medications on file prior to visit.     Review of Systems   Constitutional: Negative for fever, chills, diaphoresis,increased weight due to edema HENT: Negative for ear pain, nosebleeds, congestion, facial swelling, rhinorrhea, neck pain, neck stiffness and ear discharge.  Eyes: Negative for pain, discharge, redness, itching and visual disturbance. Respiratory: Negative for cough, choking, chest tightness, positive for shortness of breath.  Cardiovascular: Negative for chest pain, palpitations, positive for leg swelling. Gastrointestinal:  Positive for abdominal distention. Genitourinary: Negative for dysuria, urgency, frequency, hematuria, flank pain, decreased urine volume, difficulty urinating and dyspareunia.  Musculoskeletal: Negative for joint pain. Neurological: Negative for dizziness, tremors, seizures  Hematological: Negative for adenopathy. Does not bruise/bleed easily. Skin: Positive for rash. Psychiatric/Behavioral: Negative for hallucinations, behavioral problems, confusion, dysphoric mood, decreased concentration and agitation.   Objective:  Filed Vitals:   03/10/15 0958  BP: 102/78  Pulse: 112  Temp: 97.9 F (36.6 C)  SpO2: 100%      Physical Exam  Constitutional: Patient appears chronically ill looking, mildly dyspneic, sitting in a wheelchair HENT: Normocephalic, atraumatic, External right and left ear normal. Oropharynx is clear and moist.  Eyes: Conjunctivae and EOM are normal. PERRLA,  scleral icterus present Neck: Normal ROM, No JVD.  No tracheal deviation. No thyromegaly. CVS: tachycardic rate. Regular rhythm, S1/S2 +, no murmurs, no gallops, no carotid bruit.  Pulmonary: Effort and breath sounds normal, no stridor, rhonchi, wheezes, rales.  Abdominal: massive ascites with fluid thrill Musculoskeletal: Significant edema   Genito-Urinary: scrotal edema, presence of candida rash on scrotum Lymphadenopathy: No lymphadenopathy noted, cervical, inguinal or axillary Neuro: Alert. Normal reflexes, Skin: Skin is warm and dry, scratch marks on anterior abdominal wall Psychiatric: Normal mood and affect. Behavior, judgment, thought content normal.       Assessment & Plan:  49 year old male with a history of esophageal varices s/p banding, decompensated liver cirrhosis here for to establish care.  Decompensated Liver Cirrhosis with Ascites: MELD score 23   S/p paracentesis 1 week ago with rapid re accumulation Advised to pick up Low dose spironolactone added Referred to GI but unable to see GI due to lack of insurance and no [place] card or [facility]	[place]  [place]. [facility] contacted  Which patient declines at this time even though his spouse  is agreeable. He would rather be transferred to a skilled nursing facility for PT and I have explained to him that this will be difficult given he has no insurance. The patient does not seem to grasp the gravity of his condition and poor prognosis. Will need paracentesis at next office visit Labs: AFP, INR, CMET, NH4 level at next vist.  Anasarca:  Secondary to liver cirrhosis  Hypotension: Improved after dose of Lasix reduced BP will be monitored by [facility] from [facility]. Will see back in one week  Candida rash of groin:  currently on on topical antifungal Advised to allow proper aeration    Greater than half of the time spent in coordinating his care. LCSW called in to see the patient as well and we have  Spent ample amount of time  discussing his available options given he is not qualified for Medicaid in [place] but the patient seems to have the impression that he can transfer [place] to [place] to cover his medical needs. I have explained to them that undergoing paracentesis in the next few days would relieve his symptoms and he would be able to travel back to [place] way he can have access to a GI specialist and skilled nursing if he so desires but he and his spouse have decided to stay put and were of the limited services still be getting due to the insurance issues.

## 2015-03-18 ENCOUNTER — Ambulatory Visit: Payer: PRIVATE HEALTH INSURANCE | Attending: Family Medicine | Admitting: Family Medicine

## 2015-03-18 ENCOUNTER — Encounter: Payer: Self-pay | Admitting: Family Medicine

## 2015-03-18 VITALS — BP 110/76 | HR 115 | Temp 97.5°F

## 2015-03-18 DIAGNOSIS — K7031 Alcoholic cirrhosis of liver with ascites: Secondary | ICD-10-CM

## 2015-03-18 DIAGNOSIS — L309 Dermatitis, unspecified: Secondary | ICD-10-CM

## 2015-03-18 DIAGNOSIS — R188 Other ascites: Secondary | ICD-10-CM

## 2015-03-18 DIAGNOSIS — B3749 Other urogenital candidiasis: Secondary | ICD-10-CM

## 2015-03-18 DIAGNOSIS — I950 Idiopathic hypotension: Secondary | ICD-10-CM | POA: Diagnosis not present

## 2015-03-18 LAB — COMPREHENSIVE METABOLIC PANEL
ALBUMIN: 1.6 g/dL — AB (ref 3.6–5.1)
ALK PHOS: 77 U/L (ref 40–115)
ALT: 22 U/L (ref 9–46)
AST: 56 U/L — ABNORMAL HIGH (ref 10–35)
BUN: 5 mg/dL — AB (ref 7–25)
CHLORIDE: 104 mmol/L (ref 98–110)
CO2: 23 mmol/L (ref 20–31)
CREATININE: 1.18 mg/dL (ref 0.70–1.33)
Calcium: 8.3 mg/dL — ABNORMAL LOW (ref 8.6–10.3)
Glucose, Bld: 109 mg/dL — ABNORMAL HIGH (ref 65–99)
Potassium: 4.4 mmol/L (ref 3.5–5.3)
SODIUM: 136 mmol/L (ref 135–146)
TOTAL PROTEIN: 7.7 g/dL (ref 6.1–8.1)
Total Bilirubin: 2.5 mg/dL — ABNORMAL HIGH (ref 0.2–1.2)

## 2015-03-18 LAB — CBC WITH DIFFERENTIAL/PLATELET
BASOS PCT: 1 % (ref 0–1)
Basophils Absolute: 0.1 10*3/uL (ref 0.0–0.1)
EOS ABS: 0.1 10*3/uL (ref 0.0–0.7)
EOS PCT: 2 % (ref 0–5)
HCT: 27.1 % — ABNORMAL LOW (ref 39.0–52.0)
Hemoglobin: 9.1 g/dL — ABNORMAL LOW (ref 13.0–17.0)
Lymphocytes Relative: 38 % (ref 12–46)
Lymphs Abs: 2.8 10*3/uL (ref 0.7–4.0)
MCH: 27 pg (ref 26.0–34.0)
MCHC: 33.6 g/dL (ref 30.0–36.0)
MCV: 80.4 fL (ref 78.0–100.0)
MONO ABS: 0.9 10*3/uL (ref 0.1–1.0)
MONOS PCT: 13 % — AB (ref 3–12)
MPV: 10.2 fL (ref 8.6–12.4)
NEUTROS PCT: 46 % (ref 43–77)
Neutro Abs: 3.4 10*3/uL (ref 1.7–7.7)
PLATELETS: 123 10*3/uL — AB (ref 150–400)
RBC: 3.37 MIL/uL — ABNORMAL LOW (ref 4.22–5.81)
RDW: 15.2 % (ref 11.5–15.5)
WBC: 7.3 10*3/uL (ref 4.0–10.5)

## 2015-03-18 LAB — AMMONIA: Ammonia: 158 umol/L — ABNORMAL HIGH (ref 16–53)

## 2015-03-18 MED ORDER — TRAMADOL HCL 50 MG PO TABS: 50.0000 mg | ORAL_TABLET | Freq: Two times a day (BID) | ORAL | Status: DC | PRN

## 2015-03-18 MED ORDER — HYDROCORTISONE 1 % EX LOTN: 1.0000 "application " | TOPICAL_LOTION | Freq: Two times a day (BID) | CUTANEOUS | Status: AC

## 2015-03-18 NOTE — Patient Instructions (Signed)
Cirrhosis  Cirrhosis is a condition of scarring of the liver which is caused when the liver has tried repairing itself following damage. This damage may come from a previous infection such as one of the forms of hepatitis (usually hepatitis C), or the damage may come from being injured by toxins. The main toxin that causes this damage is alcohol. The scarring of the liver from use of alcohol is irreversible. That means the liver cannot return to normal even though alcohol is not used any more. The main danger of hepatitis C infection is that it may cause long-lasting (chronic) liver disease, and this also may lead to cirrhosis. This complication is progressive and irreversible.  CAUSES   Prior to available blood tests, hepatitis C could be contracted by blood transfusions. Since testing of blood has improved, this is now unlikely. This infection can also be contracted through intravenous drug use and the sharing of needles. It can also be contracted through sexual relationships. The injury caused by alcohol comes from too much use. It is not a few drinks that poison the liver, but years of misuse. Usually there will be some signs and symptoms early with scarring of the liver that suggest the development of better habits. Alcohol should never be used while using acetaminophen. A small dose of both taken together may cause irreversible damage to the liver.  HOME CARE INSTRUCTIONS   There is no specific treatment for cirrhosis. However, there are things you can do to avoid making the condition worse.  · Rest as needed.  · Eat a well-balanced diet. Your caregiver can help you with suggestions.  · Vitamin supplements including vitamins A, K, D, and thiamine can help.  · A low-salt diet, water restriction, or diuretic medicine may be needed to reduce fluid retention.  · Avoid alcohol. This can be extremely toxic if combined with acetaminophen.  · Avoid drugs which are toxic to the liver. Some of these include isoniazid,  methyldopa, acetaminophen, anabolic steroids (muscle-building drugs), erythromycin, and oral contraceptives (birth control pills). Check with your caregiver to make sure medicines you are presently taking will not be harmful.  · Periodic blood tests may be required. Follow your caregiver's advice regarding the timing of these.  · Milk thistle is an herbal remedy which does protect the liver against toxins. However, it will not help once the liver has been scarred.  SEEK MEDICAL CARE IF:  · You have increasing fatigue or weakness.  · You develop swelling of the hands, feet, legs, or face.  · You vomit bright red blood, or a coffee ground appearing material.  · You have blood in your stools, or the stools turn black and tarry.  · You have a fever.  · You develop loss of appetite, or have nausea and vomiting.  · You develop jaundice.  · You develop easy bruising or bleeding.  · You have worsening of any of the problems you are concerned about.  Document Released: 06/19/2005 Document Revised: 09/11/2011 Document Reviewed: 02/05/2008  ExitCare® Patient Information ©2015 [employer]. This information is not intended to replace advice given to you by your health care provider. Make sure you discuss any questions you have with your health care provider.

## 2015-03-18 NOTE — Progress Notes (Signed)
Subjective:    Patient ID: Francisco [name], male    DOB: [birth year], 49 y.o.   MRN: [id number]  HPI  Francisco [name] is a 49 year old male resident of [place] visiting [address] with a diagnosis of liver cirrhosis (diagnosed in 06/2014) and esophageal varices (s/p banding) who was has had two hospitalizations at [facility] (in the last 6 weeks) for decompensated liver cirrhosis and is status post several ultrasound-guided paracentesis the last of which was 2 weeks ago with removal of 4 L of clear fluid. Gastroenterology referral was placed but he has not been able to see them due to the fact that he had Medicaid from [place].  He had significant scrotal and pedal edema which have improved over time especially since spironolactone was added to his regimen. He had a scrotal rash which has improved with the use of topical antifungals but he complains of worsening pruritic rash in his abdominal wall. Endorses compliance with medications and is requesting a refill of his tramadol which she takes occasionally for abdominal pain. [facility] have been providing nursing services for the patient; hospice and palliative care was called to see the patient but he declined these services.  Accompanied by his cousin and sister today  Past Medical History  Diagnosis Date  . Hepatic cirrhosis   . Esophageal varices     last banding in [place] June 2016  . Chronic renal failure     was on hemodialysis in June 2016  . Congestive heart failure     questionable hx & on lasix  . Ascites     Past Surgical History  Procedure Laterality Date  . Esophagogastroduodenoscopy w/ banding    . Esophagogastroduodenoscopy N/A 02/06/2015    Procedure: ESOPHAGOGASTRODUODENOSCOPY (EGD);  Surgeon: Rachael Fee, MD;  Location: [facility];  Service: Endoscopy;  Laterality: N/A;  at bedside in ICU    Social History   Social History  . Marital Status: Married    Spouse Name: N/A  . Number of Children: N/A    . Years of Education: N/A   Occupational History  . Not on file.   Social History Main Topics  . Smoking status: Former Smoker    Quit date: 10/02/2014  . Smokeless tobacco: Never Used     Comment: off and on for 20 years  . Alcohol Use: No     Comment: Quit drinking EtOH prior to prolonged admission in April 2016  . Drug Use: No  . Sexual Activity: Not on file   Other Topics Concern  . Not on file   Social History Narrative   [place] is from [place]. Hospitalized with variceal bleed and banding this Spring and discharged to [facility] June 2016. Discharged from SNF 1 week ago. Previously worked as a Paediatric nurse. Married.    No Known Allergies  Current Outpatient Prescriptions on File Prior to Visit  Medication Sig Dispense Refill  . albuterol (PROVENTIL HFA;VENTOLIN HFA) 108 (90 BASE) MCG/ACT inhaler Inhale 1-2 puffs into the lungs every 6 (six) hours as needed for wheezing or shortness of breath. 1 each 2  . Ca Carbonate-Mag Hydroxide (ROLAIDS) 550-110 MG CHEW Chew 1 tablet by mouth 3 (three) times daily as needed (for heartburn).     . feeding supplement, ENSURE ENLIVE, (ENSURE ENLIVE) LIQD Take 237 mLs by mouth 2 (two) times daily between meals. 237 mL 12  . furosemide (LASIX) 40 MG tablet Take 0.5 tablets (20 mg total) by mouth 2 (two) times daily. 60 tablet 2  .  lactulose (CHRONULAC) 10 GM/15ML solution Take 45 mLs (30 g total) by mouth every 8 (eight) hours. 240 mL 3  . magnesium oxide (MAG-OX) 400 (241.3 MG) MG tablet Take 1 tablet (400 mg total) by mouth 2 (two) times daily. 60 tablet 3  . nystatin (MYCOSTATIN/NYSTOP) 100000 UNIT/GM POWD Apply 1 Bottle topically 3 (three) times daily. 120 g 2  . potassium chloride SA (K-DUR,KLOR-CON) 20 MEQ tablet Take 1 tablet (20 mEq total) by mouth 3 (three) times daily. 90 tablet 3  . spironolactone (ALDACTONE) 25 MG tablet Take 1 tablet (25 mg total) by mouth daily. 30 tablet 2   No current facility-administered medications on file prior to visit.       Review of Systems Constitutional: Negative for fever, chills, diaphoresis,increased weight due to edema HENT: Negative for ear pain, nosebleeds, congestion, facial swelling, rhinorrhea, neck pain, neck stiffness and ear discharge.  Eyes: Negative for pain, discharge, redness, itching and visual disturbance. Respiratory: Negative for cough, choking, chest tightness, positive for shortness of breath.  Cardiovascular: Negative for chest pain, palpitations, positive for leg swelling. Gastrointestinal:  Positive for abdominal distention. Genitourinary: Negative for dysuria, urgency, frequency, hematuria, flank pain, decreased urine volume, difficulty urinating and dyspareunia.  Musculoskeletal: Negative for joint pain. Neurological: Negative for dizziness, tremors, seizures  Hematological: Negative for adenopathy. Does not bruise/bleed easily. Skin: Positive for rash which is pruritic. Psychiatric/Behavioral: Negative for hallucinations, behavioral problems, confusion, dysphoric mood, decreased concentration and agitation.      Objective: Filed Vitals:   03/18/15 0956  BP: 110/76  Pulse: 115  Temp: 97.5 F (36.4 C)  SpO2: 100%      Physical Exam  Constitutional: Patient appears chronically ill looking, mildly dyspneic, sitting in a wheelchair HENT: Normocephalic, atraumatic, External right and left ear normal. Oropharynx is clear and moist.  Eyes: Conjunctivae and EOM are normal. PERRLA,  scleral icterus present Neck: Normal ROM, No JVD. No tracheal deviation. No thyromegaly. CVS: tachycardic rate. Regular rhythm, S1/S2 +, no murmurs, no gallops, no carotid bruit.  Pulmonary: Effort and breath sounds normal, no stridor, rhonchi, wheezes, rales.  Abdominal: massive ascites with fluid thrill Musculoskeletal: 2+ pedal edema (improved compared to last visit)   Genito-Urinary: no scrotal edema, presence of candida rash on scrotum which has improved Lymphadenopathy: No  lymphadenopathy noted, cervical, inguinal or axillary Neuro: Alert. Normal reflexes, Skin: Skin is warm and dry, extensive anterior abdominal wall covered with hyperpigmented rash which also appears in a patchy distribution on arms  Psychiatric: Normal mood and affect. Behavior, judgment, thought content normal.       Assessment & Plan:  49 year old male with a history of esophageal varices s/p banding, decompensated liver cirrhosis here for a follow up visit.  Decompensated Liver Cirrhosis with Ascites: MELD score 23   S/p paracentesis 2 week ago with rapid re accumulation Greater than half of the duration of the encounter spent coordinating appointment with ultrasound for repeat paracentesis today. Continue Lasix and spironolactone  Referred to GI but unable to see GI due to lack of insurance and no [place] card or [facility]	[place] as [place]  [place]. [facility] contacted  Which patient declines at this time even though his spouse is agreeable. He would rather be transferred to a skilled nursing facility for PT and I have explained to him that this will be difficult given he has no insurance. The patient does not seem to grasp the gravity of his condition and poor prognosis. Labs: AFP, INR,  CMET, NH4 today.  Anasarca:  Secondary to liver cirrhosis Improved compared to last visit  Hypotension: Improved after dose of Lasix reduced BP will be monitored by [facility] nurse from [facility]. Will see back in 2 weeks  Candida rash of groin: Improved  currently on on topical antifungal Advised to allow proper aeration  Dermatitis: Worsening Placed on Hydrocortisone cream   Greater than half of the time spent in coordinating his care. The patient canceled his [place] Medicaid in anticipation of obtaining Medicaid here in [place] even though at his last visit the LCSW and myself Spent ample amount of time discussing his available options given he is not  qualified for Medicaid in [place] but the patient seems to have the impression that he can transfer [place] to [place] to cover his medical needs. I had explained to them that undergoing paracentesis in the next few days would relieve his symptoms and he would be able to travel back to [place] where he can have access to a GI specialist and skilled nursing if he so desires but he and his spouse have decided to stay put and are aware of the limited services he will be getting due to the lack of insurance. He has also declined hospice services.

## 2015-03-18 NOTE — Progress Notes (Signed)
Patient arrives in wheelchair from home He complains of no pain or depression or anxiety or SOB His sister is here today with him and states they cancelled to [facility] and are in the process of applying for Medicaid in [place] He needs a refill on his Tramadol He states he feels he does not currently need paracentesis but would defer to the MD and her opinion He still would like to work with PT

## 2015-03-19 LAB — AFP TUMOR MARKER: AFP-Tumor Marker: 2.8 ng/mL (ref <6.1)

## 2015-03-20 ENCOUNTER — Encounter (HOSPITAL_COMMUNITY): Payer: Self-pay | Admitting: Anesthesiology

## 2015-03-20 ENCOUNTER — Emergency Department (HOSPITAL_COMMUNITY): Payer: Medicaid - Out of State

## 2015-03-20 ENCOUNTER — Inpatient Hospital Stay (HOSPITAL_COMMUNITY): Payer: Medicaid - Out of State

## 2015-03-20 ENCOUNTER — Inpatient Hospital Stay (HOSPITAL_COMMUNITY)
Admission: EM | Admit: 2015-03-20 | Discharge: 2015-03-28 | DRG: 380 | Disposition: A | Discharge status: Hospice | Payer: Medicaid - Out of State | Attending: Internal Medicine | Admitting: Internal Medicine

## 2015-03-20 ENCOUNTER — Encounter (HOSPITAL_COMMUNITY)
Admission: EM | Disposition: A | Discharge status: Hospice | Payer: Self-pay | Source: Home / Self Care | Attending: Internal Medicine

## 2015-03-20 ENCOUNTER — Encounter (HOSPITAL_COMMUNITY): Payer: Self-pay | Admitting: Radiology

## 2015-03-20 DIAGNOSIS — D61818 Other pancytopenia: Secondary | ICD-10-CM | POA: Diagnosis present

## 2015-03-20 DIAGNOSIS — K3189 Other diseases of stomach and duodenum: Secondary | ICD-10-CM | POA: Diagnosis present

## 2015-03-20 DIAGNOSIS — E861 Hypovolemia: Secondary | ICD-10-CM | POA: Diagnosis present

## 2015-03-20 DIAGNOSIS — Z8619 Personal history of other infectious and parasitic diseases: Secondary | ICD-10-CM | POA: Diagnosis not present

## 2015-03-20 DIAGNOSIS — R1110 Vomiting, unspecified: Secondary | ICD-10-CM | POA: Insufficient documentation

## 2015-03-20 DIAGNOSIS — J9601 Acute respiratory failure with hypoxia: Secondary | ICD-10-CM | POA: Diagnosis not present

## 2015-03-20 DIAGNOSIS — Z515 Encounter for palliative care: Secondary | ICD-10-CM

## 2015-03-20 DIAGNOSIS — I5032 Chronic diastolic (congestive) heart failure: Secondary | ICD-10-CM | POA: Diagnosis present

## 2015-03-20 DIAGNOSIS — K920 Hematemesis: Secondary | ICD-10-CM | POA: Diagnosis present

## 2015-03-20 DIAGNOSIS — I8500 Esophageal varices without bleeding: Secondary | ICD-10-CM | POA: Diagnosis present

## 2015-03-20 DIAGNOSIS — N189 Chronic kidney disease, unspecified: Secondary | ICD-10-CM | POA: Diagnosis present

## 2015-03-20 DIAGNOSIS — K921 Melena: Secondary | ICD-10-CM | POA: Diagnosis present

## 2015-03-20 DIAGNOSIS — K7031 Alcoholic cirrhosis of liver with ascites: Secondary | ICD-10-CM | POA: Diagnosis present

## 2015-03-20 DIAGNOSIS — Z79899 Other long term (current) drug therapy: Secondary | ICD-10-CM | POA: Diagnosis not present

## 2015-03-20 DIAGNOSIS — D689 Coagulation defect, unspecified: Secondary | ICD-10-CM | POA: Diagnosis present

## 2015-03-20 DIAGNOSIS — K567 Ileus, unspecified: Secondary | ICD-10-CM | POA: Diagnosis present

## 2015-03-20 DIAGNOSIS — K7290 Hepatic failure, unspecified without coma: Secondary | ICD-10-CM | POA: Insufficient documentation

## 2015-03-20 DIAGNOSIS — E871 Hypo-osmolality and hyponatremia: Secondary | ICD-10-CM | POA: Diagnosis present

## 2015-03-20 DIAGNOSIS — D62 Acute posthemorrhagic anemia: Secondary | ICD-10-CM | POA: Diagnosis present

## 2015-03-20 DIAGNOSIS — R339 Retention of urine, unspecified: Secondary | ICD-10-CM | POA: Diagnosis present

## 2015-03-20 DIAGNOSIS — K7210 Chronic hepatic failure without coma: Secondary | ICD-10-CM | POA: Diagnosis present

## 2015-03-20 DIAGNOSIS — G4700 Insomnia, unspecified: Secondary | ICD-10-CM | POA: Diagnosis not present

## 2015-03-20 DIAGNOSIS — K2210 Ulcer of esophagus without bleeding: Secondary | ICD-10-CM | POA: Diagnosis present

## 2015-03-20 DIAGNOSIS — Z978 Presence of other specified devices: Secondary | ICD-10-CM

## 2015-03-20 DIAGNOSIS — Z66 Do not resuscitate: Secondary | ICD-10-CM | POA: Diagnosis present

## 2015-03-20 DIAGNOSIS — K922 Gastrointestinal hemorrhage, unspecified: Secondary | ICD-10-CM | POA: Diagnosis not present

## 2015-03-20 DIAGNOSIS — G9340 Encephalopathy, unspecified: Secondary | ICD-10-CM | POA: Diagnosis present

## 2015-03-20 DIAGNOSIS — K2961 Other gastritis with bleeding: Secondary | ICD-10-CM | POA: Diagnosis not present

## 2015-03-20 DIAGNOSIS — Z8711 Personal history of peptic ulcer disease: Secondary | ICD-10-CM | POA: Diagnosis not present

## 2015-03-20 DIAGNOSIS — K766 Portal hypertension: Secondary | ICD-10-CM | POA: Diagnosis present

## 2015-03-20 DIAGNOSIS — K208 Other esophagitis: Secondary | ICD-10-CM | POA: Diagnosis not present

## 2015-03-20 DIAGNOSIS — Z87891 Personal history of nicotine dependence: Secondary | ICD-10-CM

## 2015-03-20 DIAGNOSIS — I959 Hypotension, unspecified: Secondary | ICD-10-CM | POA: Diagnosis present

## 2015-03-20 DIAGNOSIS — R112 Nausea with vomiting, unspecified: Secondary | ICD-10-CM | POA: Diagnosis not present

## 2015-03-20 DIAGNOSIS — R109 Unspecified abdominal pain: Secondary | ICD-10-CM

## 2015-03-20 DIAGNOSIS — Z789 Other specified health status: Secondary | ICD-10-CM | POA: Diagnosis not present

## 2015-03-20 DIAGNOSIS — K2921 Alcoholic gastritis with bleeding: Secondary | ICD-10-CM | POA: Diagnosis not present

## 2015-03-20 HISTORY — DX: Disorder of kidney and ureter, unspecified: N28.9

## 2015-03-20 HISTORY — PX: ESOPHAGOGASTRODUODENOSCOPY: SHX5428

## 2015-03-20 LAB — CBC WITH DIFFERENTIAL/PLATELET
BASOS ABS: 0 10*3/uL (ref 0.0–0.1)
Basophils Relative: 0 %
Eosinophils Absolute: 0 10*3/uL (ref 0.0–0.7)
Eosinophils Relative: 0 %
HEMATOCRIT: 24.9 % — AB (ref 39.0–52.0)
HEMOGLOBIN: 8.3 g/dL — AB (ref 13.0–17.0)
LYMPHS PCT: 22 %
Lymphs Abs: 2 10*3/uL (ref 0.7–4.0)
MCH: 26.9 pg (ref 26.0–34.0)
MCHC: 33.3 g/dL (ref 30.0–36.0)
MCV: 80.6 fL (ref 78.0–100.0)
MONOS PCT: 16 %
Monocytes Absolute: 1.5 10*3/uL — ABNORMAL HIGH (ref 0.1–1.0)
NEUTROS PCT: 62 %
Neutro Abs: 5.8 10*3/uL (ref 1.7–7.7)
Platelets: 114 10*3/uL — ABNORMAL LOW (ref 150–400)
RBC: 3.09 MIL/uL — AB (ref 4.22–5.81)
RDW: 15.9 % — ABNORMAL HIGH (ref 11.5–15.5)
WBC: 9.3 10*3/uL (ref 4.0–10.5)

## 2015-03-20 LAB — TRIGLYCERIDES: TRIGLYCERIDES: 555 mg/dL — AB (ref <150)

## 2015-03-20 LAB — CBC
HEMATOCRIT: 17.8 % — AB (ref 39.0–52.0)
Hemoglobin: 6.3 g/dL — CL (ref 13.0–17.0)
MCH: 28.6 pg (ref 26.0–34.0)
MCHC: 35.4 g/dL (ref 30.0–36.0)
MCV: 80.9 fL (ref 78.0–100.0)
PLATELETS: 77 10*3/uL — AB (ref 150–400)
RBC: 2.2 MIL/uL — ABNORMAL LOW (ref 4.22–5.81)
RDW: 16 % — AB (ref 11.5–15.5)
WBC: 7.5 10*3/uL (ref 4.0–10.5)

## 2015-03-20 LAB — GLUCOSE, CAPILLARY: Glucose-Capillary: 103 mg/dL — ABNORMAL HIGH (ref 65–99)

## 2015-03-20 LAB — COMPREHENSIVE METABOLIC PANEL
ALBUMIN: 1.4 g/dL — AB (ref 3.5–5.0)
ALK PHOS: 75 U/L (ref 38–126)
ALT: 23 U/L (ref 17–63)
AST: 64 U/L — AB (ref 15–41)
Anion gap: 7 (ref 5–15)
BILIRUBIN TOTAL: 2.4 mg/dL — AB (ref 0.3–1.2)
CALCIUM: 8 mg/dL — AB (ref 8.9–10.3)
CO2: 24 mmol/L (ref 22–32)
Chloride: 102 mmol/L (ref 101–111)
Creatinine, Ser: 1.4 mg/dL — ABNORMAL HIGH (ref 0.61–1.24)
GFR calc Af Amer: >60 mL/min (ref >60)
GFR calc non Af Amer: 57 mL/min — ABNORMAL LOW (ref >60)
GLUCOSE: 110 mg/dL — AB (ref 65–99)
Potassium: 4 mmol/L (ref 3.5–5.1)
Sodium: 133 mmol/L — ABNORMAL LOW (ref 135–145)
TOTAL PROTEIN: 7.6 g/dL (ref 6.5–8.1)

## 2015-03-20 LAB — I-STAT CHEM 8, ED
BUN: 6 mg/dL (ref 6–20)
CALCIUM ION: 1.07 mmol/L — AB (ref 1.12–1.23)
CREATININE: 1.3 mg/dL — AB (ref 0.61–1.24)
Chloride: 99 mmol/L — ABNORMAL LOW (ref 101–111)
GLUCOSE: 106 mg/dL — AB (ref 65–99)
HEMATOCRIT: 31 % — AB (ref 39.0–52.0)
HEMOGLOBIN: 10.5 g/dL — AB (ref 13.0–17.0)
Potassium: 4 mmol/L (ref 3.5–5.1)
Sodium: 138 mmol/L (ref 135–145)
TCO2: 22 mmol/L (ref 0–100)

## 2015-03-20 LAB — URINE MICROSCOPIC-ADD ON

## 2015-03-20 LAB — PREPARE RBC (CROSSMATCH)

## 2015-03-20 LAB — ABO/RH: ABO/RH(D): O POS

## 2015-03-20 LAB — AMMONIA: AMMONIA: 48 umol/L — AB (ref 9–35)

## 2015-03-20 LAB — URINALYSIS, ROUTINE W REFLEX MICROSCOPIC
Glucose, UA: NEGATIVE mg/dL
KETONES UR: 15 mg/dL — AB
NITRITE: NEGATIVE
PH: 5.5 (ref 5.0–8.0)
PROTEIN: NEGATIVE mg/dL
Specific Gravity, Urine: 1.019 (ref 1.005–1.030)
Urobilinogen, UA: 1 mg/dL (ref 0.0–1.0)

## 2015-03-20 LAB — APTT: aPTT: 48 seconds — ABNORMAL HIGH (ref 24–37)

## 2015-03-20 LAB — MRSA PCR SCREENING: MRSA by PCR: NEGATIVE

## 2015-03-20 LAB — LACTIC ACID, PLASMA
LACTIC ACID, VENOUS: 2.8 mmol/L — AB (ref 0.5–2.0)
Lactic Acid, Venous: 4 mmol/L (ref 0.5–2.0)

## 2015-03-20 LAB — LIPASE, BLOOD: Lipase: 25 U/L (ref 22–51)

## 2015-03-20 LAB — PROCALCITONIN

## 2015-03-20 LAB — PROTIME-INR
INR: 2.57 — ABNORMAL HIGH (ref 0.00–1.49)
PROTHROMBIN TIME: 27.2 s — AB (ref 11.6–15.2)

## 2015-03-20 LAB — POC OCCULT BLOOD, ED: Fecal Occult Bld: POSITIVE — AB

## 2015-03-20 SURGERY — EGD (ESOPHAGOGASTRODUODENOSCOPY)
Anesthesia: Monitor Anesthesia Care

## 2015-03-20 MED ORDER — SODIUM CHLORIDE 0.9 % IV SOLN
INTRAVENOUS | Status: DC
Start: 2015-03-20 — End: 2015-03-28
  Administered 2015-03-20 – 2015-03-22 (×5): via INTRAVENOUS

## 2015-03-20 MED ORDER — SODIUM CHLORIDE 0.9 % IV SOLN: Freq: Once | INTRAVENOUS | Status: DC

## 2015-03-20 MED ORDER — ETOMIDATE 2 MG/ML IV SOLN: 20.0000 mg | Freq: Once | INTRAVENOUS | Status: AC

## 2015-03-20 MED ORDER — PROPOFOL 1000 MG/100ML IV EMUL
5.0000 ug/kg/min | INTRAVENOUS | Status: DC
  Administered 2015-03-20: 10 ug/kg/min via INTRAVENOUS
  Administered 2015-03-20 – 2015-03-21 (×2): 25 ug/kg/min via INTRAVENOUS
  Administered 2015-03-21: 20 ug/kg/min via INTRAVENOUS
  Filled 2015-03-20: qty 200
  Filled 2015-03-20: qty 100

## 2015-03-20 MED ORDER — ANTISEPTIC ORAL RINSE SOLUTION (CORINZ)
7.0000 mL | Freq: Four times a day (QID) | OROMUCOSAL | Status: DC
  Administered 2015-03-21 – 2015-03-23 (×7): 7 mL via OROMUCOSAL

## 2015-03-20 MED ORDER — PROPOFOL 1000 MG/100ML IV EMUL
INTRAVENOUS | Status: AC
  Filled 2015-03-20: qty 100

## 2015-03-20 MED ORDER — SODIUM CHLORIDE 0.9 % IV SOLN: Freq: Once | INTRAVENOUS | Status: AC

## 2015-03-20 MED ORDER — SODIUM CHLORIDE 0.9 % IV SOLN
50.0000 ug/h | INTRAVENOUS | Status: DC
  Administered 2015-03-20: 50 ug/h via INTRAVENOUS
  Filled 2015-03-20 (×3): qty 1

## 2015-03-20 MED ORDER — OCTREOTIDE LOAD VIA INFUSION
100.0000 ug | Freq: Once | INTRAVENOUS | Status: DC
  Filled 2015-03-20: qty 50

## 2015-03-20 MED ORDER — FENTANYL CITRATE (PF) 100 MCG/2ML IJ SOLN
200.0000 ug | Freq: Once | INTRAMUSCULAR | Status: AC
  Administered 2015-03-20: 200 ug via INTRAVENOUS

## 2015-03-20 MED ORDER — VITAMIN K1 10 MG/ML IJ SOLN
5.0000 mg | Freq: Once | INTRAMUSCULAR | Status: AC
  Administered 2015-03-20: 5 mg via INTRAVENOUS
  Filled 2015-03-20: qty 0.5

## 2015-03-20 MED ORDER — NOREPINEPHRINE BITARTRATE 1 MG/ML IV SOLN
2.0000 ug/min | INTRAVENOUS | Status: DC
  Administered 2015-03-20: 5 ug/min via INTRAVENOUS
  Filled 2015-03-20: qty 4

## 2015-03-20 MED ORDER — FENTANYL CITRATE (PF) 100 MCG/2ML IJ SOLN
INTRAMUSCULAR | Status: AC
  Filled 2015-03-20: qty 4

## 2015-03-20 MED ORDER — ONDANSETRON HCL 4 MG/2ML IJ SOLN
4.0000 mg | Freq: Once | INTRAMUSCULAR | Status: AC
  Administered 2015-03-20: 4 mg via INTRAVENOUS
  Filled 2015-03-20: qty 2

## 2015-03-20 MED ORDER — SODIUM CHLORIDE 0.9 % IV SOLN
1000.0000 mL | Freq: Once | INTRAVENOUS | Status: AC
  Administered 2015-03-20: 1000 mL via INTRAVENOUS

## 2015-03-20 MED ORDER — OCTREOTIDE LOAD VIA INFUSION
50.0000 ug | Freq: Once | INTRAVENOUS | Status: AC
  Administered 2015-03-20: 50 ug via INTRAVENOUS
  Filled 2015-03-20: qty 25

## 2015-03-20 MED ORDER — SODIUM CHLORIDE 0.9 % IV SOLN
8.0000 mg/h | INTRAVENOUS | Status: DC
  Administered 2015-03-20 – 2015-03-21 (×2): 8 mg/h via INTRAVENOUS
  Filled 2015-03-20 (×4): qty 80

## 2015-03-20 MED ORDER — CHLORHEXIDINE GLUCONATE 0.12% ORAL RINSE (MEDLINE KIT)
15.0000 mL | Freq: Two times a day (BID) | OROMUCOSAL | Status: DC
  Administered 2015-03-20 – 2015-03-21 (×2): 15 mL via OROMUCOSAL

## 2015-03-20 MED ORDER — MIDAZOLAM HCL 2 MG/2ML IJ SOLN
INTRAMUSCULAR | Status: AC
  Filled 2015-03-20: qty 2

## 2015-03-20 MED ORDER — FENTANYL CITRATE (PF) 100 MCG/2ML IJ SOLN
INTRAMUSCULAR | Status: AC
  Filled 2015-03-20: qty 2

## 2015-03-20 MED ORDER — SODIUM CHLORIDE 0.9 % IV SOLN: 80.0000 mg | INTRAVENOUS | Status: DC

## 2015-03-20 MED ORDER — MIDAZOLAM HCL 2 MG/2ML IJ SOLN
2.0000 mg | Freq: Once | INTRAMUSCULAR | Status: AC
  Administered 2015-03-20: 2 mg via INTRAVENOUS

## 2015-03-20 MED ORDER — MIDAZOLAM HCL 2 MG/2ML IJ SOLN
INTRAMUSCULAR | Status: AC
  Filled 2015-03-20: qty 4

## 2015-03-20 MED ORDER — SODIUM CHLORIDE 0.9 % IV BOLUS (SEPSIS)
1000.0000 mL | Freq: Once | INTRAVENOUS | Status: AC
  Administered 2015-03-20: 1000 mL via INTRAVENOUS

## 2015-03-20 MED ORDER — SODIUM CHLORIDE 0.9 % IV SOLN
80.0000 mg | Freq: Once | INTRAVENOUS | Status: AC
  Administered 2015-03-20: 80 mg via INTRAVENOUS
  Filled 2015-03-20: qty 80

## 2015-03-20 MED ORDER — DEXTROSE 5 % IV SOLN
1.0000 g | INTRAVENOUS | Status: DC
  Administered 2015-03-20 – 2015-03-21 (×2): 1 g via INTRAVENOUS
  Filled 2015-03-20 (×3): qty 10

## 2015-03-20 MED ORDER — MORPHINE SULFATE (PF) 4 MG/ML IV SOLN
4.0000 mg | Freq: Once | INTRAVENOUS | Status: AC
  Administered 2015-03-20: 4 mg via INTRAVENOUS
  Filled 2015-03-20: qty 1

## 2015-03-20 MED ORDER — DEXTROSE 5 % IV SOLN
1.0000 g | Freq: Once | INTRAVENOUS | Status: AC
  Administered 2015-03-20: 1 g via INTRAVENOUS
  Filled 2015-03-20: qty 10

## 2015-03-20 MED ORDER — ONDANSETRON HCL 4 MG/2ML IJ SOLN
4.0000 mg | Freq: Four times a day (QID) | INTRAMUSCULAR | Status: DC | PRN
Start: 2015-03-20 — End: 2015-03-28
  Administered 2015-03-20 – 2015-03-26 (×5): 4 mg via INTRAVENOUS
  Filled 2015-03-20 (×5): qty 2

## 2015-03-20 NOTE — Procedures (Signed)
Intubation Procedure Note Francisco [name] [id number]   06/06/1966  Procedure: Intubation Indications: Airway protection and maintenance  Procedure Details Consent: Risks of procedure as well as the alternatives and risks of each were explained to the (patient/caregiver).  Consent for procedure obtained. Time Out: Verified patient identification, verified procedure, site/side was marked, verified correct patient position, special equipment/implants available, medications/allergies/relevent history reviewed, required imaging and test results available.  Performed  Maximum sterile technique was used including gloves, gown, hand hygiene and mask.  MAC and 3 Glidescope  8.0 ETT placed under direct vis w glidescope. There was dark black fluid in the posterior pharynx, no acute bleeding seen.   Evaluation Hemodynamic Status: Empiric norepi was started prior to initiation of sedation; O2 sats: stable throughout Patient's Current Condition: stable Complications: No apparent complications Patient did tolerate procedure well. Chest X-ray ordered to verify placement.  CXR: pending.   Levy Pupa, MD, PhD 03/20/2015, 2:35 PM [facility] [phone] or if no answer [phone]

## 2015-03-20 NOTE — ED Provider Notes (Signed)
CSN: [id number]     Arrival date & time 03/20/15  0736 History   First MD Initiated Contact with Patient 03/20/15 [phone]     Chief Complaint  Patient presents with  . Vomiting    Vomiting dark brown blood     (Consider location/radiation/quality/duration/timing/severity/associated sxs/prior Treatment) Patient is a 49 y.o. male presenting with vomiting. The history is provided by the patient and a relative.  Emesis Severity:  Severe Duration:  1 day Timing:  Constant Quality:  Bright red blood Progression:  Worsening Chronicity:  Recurrent Relieved by:  Nothing Worsened by:  Nothing tried Ineffective treatments:  None tried Associated symptoms: abdominal pain (diffuse)   Associated symptoms: no arthralgias, no chills, no diarrhea, no headaches and no myalgias   Risk factors comment:  Hx of cirrhosis   49 yo M with a significant past medical history of hepatic cirrhosis with a meld score of 23 with a chief complaint of vomiting bright red blood. Patient states this started last night. Patient has a history of varices as well. States he's feeling lightheaded, dizzy like he's going to pass out. History of GI bleeds in the past. Last vomited about 2 hours ago. Has some abdominal pain with this. This pain however is been going on since he has had recurrent abdominal distention secondary to ascites. Has a planned paracentesis on Monday. Denies fevers or chills. Family states the patient has been confused at home.   Past Medical History  Diagnosis Date  . Hepatic cirrhosis   . Esophageal varices     last banding in [place] June 2016  . Chronic renal failure     was on hemodialysis in June 2016  . Congestive heart failure     questionable hx & on lasix  . Ascites   . Renal insufficiency    Past Surgical History  Procedure Laterality Date  . Esophagogastroduodenoscopy w/ banding    . Esophagogastroduodenoscopy N/A 02/06/2015    Procedure: ESOPHAGOGASTRODUODENOSCOPY (EGD);  Surgeon:  Rachael Fee, MD;  Location: [facility];  Service: Endoscopy;  Laterality: N/A;  at bedside in ICU   Family History  Problem Relation Age of Onset  . COPD Mother   . Asthma Mother   . Leukemia Maternal Grandmother   . Congestive Heart Failure Maternal Grandfather   . Cancer Other     2 aunts with known ca   Social History  Substance Use Topics  . Smoking status: Former Smoker    Quit date: 10/02/2014  . Smokeless tobacco: Never Used     Comment: off and on for 20 years  . Alcohol Use: No     Comment: Quit drinking EtOH prior to prolonged admission in April 2016    Review of Systems  Constitutional: Negative for fever and chills.  HENT: Negative for congestion and facial swelling.   Eyes: Negative for discharge and visual disturbance.  Respiratory: Negative for shortness of breath.   Cardiovascular: Negative for chest pain and palpitations.  Gastrointestinal: Positive for vomiting, abdominal pain (diffuse) and blood in stool. Negative for diarrhea.  Musculoskeletal: Negative for myalgias and arthralgias.  Skin: Negative for color change and rash.  Neurological: Negative for tremors, syncope and headaches.  Psychiatric/Behavioral: Negative for confusion and dysphoric mood.      Allergies  Review of patient's allergies indicates no known allergies.  Home Medications   Prior to Admission medications   Medication Sig Start Date End Date Taking? Authorizing Provider  albuterol (PROVENTIL HFA;VENTOLIN HFA) 108 (90 BASE) MCG/ACT  inhaler Inhale 1-2 puffs into the lungs every 6 (six) hours as needed for wheezing or shortness of breath. 03/03/15  Yes Jaclyn Shaggy, MD  Ca Carbonate-Mag Hydroxide (ROLAIDS) 550-110 MG CHEW Chew 1 tablet by mouth 3 (three) times daily as needed (for heartburn).    Yes Historical Provider, MD  feeding supplement, ENSURE ENLIVE, (ENSURE ENLIVE) LIQD Take 237 mLs by mouth 2 (two) times daily between meals. 02/26/15  Yes Maryruth Bun [name], MD  furosemide  (LASIX) 40 MG tablet Take 0.5 tablets (20 mg total) by mouth 2 (two) times daily. 03/03/15  Yes Jaclyn Shaggy, MD  hydrocortisone 1 % lotion Apply 1 application topically 2 (two) times daily. 03/18/15  Yes Jaclyn Shaggy, MD  lactulose (CHRONULAC) 10 GM/15ML solution Take 45 mLs (30 g total) by mouth every 8 (eight) hours. 02/26/15  Yes Maryruth Bun [name], MD  magnesium oxide (MAG-OX) 400 (241.3 MG) MG tablet Take 1 tablet (400 mg total) by mouth 2 (two) times daily. 02/26/15  Yes Maryruth Bun [name], MD  nystatin (MYCOSTATIN/NYSTOP) 100000 UNIT/GM POWD Apply 1 Bottle topically 3 (three) times daily. 03/10/15  Yes Jaclyn Shaggy, MD  oxyCODONE (OXY IR/ROXICODONE) 5 MG immediate release tablet Take 5 mg by mouth every 4 (four) hours as needed for moderate pain.  02/26/15  Yes Historical Provider, MD  potassium chloride SA (K-DUR,KLOR-CON) 20 MEQ tablet Take 1 tablet (20 mEq total) by mouth 3 (three) times daily. 02/26/15  Yes Maryruth Bun [name], MD  spironolactone (ALDACTONE) 25 MG tablet Take 1 tablet (25 mg total) by mouth daily. 03/10/15  Yes Jaclyn Shaggy, MD  traMADol (ULTRAM) 50 MG tablet Take 1 tablet (50 mg total) by mouth every 12 (twelve) hours as needed. Patient taking differently: Take 50 mg by mouth every 12 (twelve) hours as needed for moderate pain.  03/18/15  Yes Jaclyn Shaggy, MD   BP 116/71 mmHg  Pulse 126  Temp(Src) 97.7 F (36.5 C) (Oral)  Resp 14  Ht 5\' 6"  (1.676 m)  SpO2 100% Physical Exam  Constitutional: He is oriented to person, place, and time. He appears well-developed and well-nourished.  HENT:  Head: Normocephalic and atraumatic.  Conjunctival pallor  Eyes: EOM are normal. Pupils are equal, round, and reactive to light.  Neck: Normal range of motion. Neck supple. No JVD present.  Cardiovascular: Regular rhythm.  Tachycardia present.  Exam reveals no gallop and no friction rub.   No murmur heard. Pulmonary/Chest: No respiratory distress. He has no wheezes.  Abdominal: He exhibits  distension. There is tenderness. There is no rebound and no guarding.  Positive fluid wave  Musculoskeletal: Normal range of motion.  Neurological: He is alert and oriented to person, place, and time.  Skin: No rash noted. No pallor.  Psychiatric: He has a normal mood and affect. His behavior is normal.    ED Course  Procedures (including critical care time) Labs Review Labs Reviewed  AMMONIA - Abnormal; Notable for the following:    Ammonia 48 (*)    All other components within normal limits  CBC WITH DIFFERENTIAL/PLATELET - Abnormal; Notable for the following:    RBC 3.09 (*)    Hemoglobin 8.3 (*)    HCT 24.9 (*)    RDW 15.9 (*)    Platelets 114 (*)    Monocytes Absolute 1.5 (*)    All other components within normal limits  COMPREHENSIVE METABOLIC PANEL - Abnormal; Notable for the following:    Sodium 133 (*)    Glucose, Bld 110 (*)  BUN <5 (*)    Creatinine, Ser 1.40 (*)    Calcium 8.0 (*)    Albumin 1.4 (*)    AST 64 (*)    Total Bilirubin 2.4 (*)    GFR calc non Af Amer 57 (*)    All other components within normal limits  LACTIC ACID, PLASMA - Abnormal; Notable for the following:    Lactic Acid, Venous 4.0 (*)    All other components within normal limits  PROTIME-INR - Abnormal; Notable for the following:    Prothrombin Time 27.2 (*)    INR 2.57 (*)    All other components within normal limits  APTT - Abnormal; Notable for the following:    aPTT 48 (*)    All other components within normal limits  GLUCOSE, CAPILLARY - Abnormal; Notable for the following:    Glucose-Capillary 103 (*)    All other components within normal limits  POC OCCULT BLOOD, ED - Abnormal; Notable for the following:    Fecal Occult Bld POSITIVE (*)    All other components within normal limits  I-STAT CHEM 8, ED - Abnormal; Notable for the following:    Chloride 99 (*)    Creatinine, Ser 1.30 (*)    Glucose, Bld 106 (*)    Calcium, Ion 1.07 (*)    Hemoglobin 10.5 (*)    HCT 31.0 (*)     All other components within normal limits  MRSA PCR SCREENING  LIPASE, BLOOD  PROCALCITONIN  LACTIC ACID, PLASMA  CBC  CBC  CBC  TYPE AND SCREEN  ABO/RH  PREPARE FRESH FROZEN PLASMA  PREPARE RBC (CROSSMATCH)    Imaging Review Ct Abdomen Pelvis Wo Contrast  03/20/2015   CLINICAL DATA:  Vomiting blood twice last night.  Blood in stool.  EXAM: CT ABDOMEN AND PELVIS WITHOUT CONTRAST  TECHNIQUE: Multidetector CT imaging of the abdomen and pelvis was performed following the standard protocol without IV contrast.  COMPARISON:  None.  FINDINGS: Lower chest: Clear lung bases. Normal heart size. Distal esophageal dilatation with fluid within the esophagus.  Hepatobiliary: Diminutive size of the liver with a micronodular contour. No focal hepatic mass. Cholelithiasis. No intrahepatic or extrahepatic biliary duct dilatation. Massive ascites.  Pancreas: Normal.  Spleen: Normal.  Adrenals/Urinary Tract: Normal adrenal glands. Normal kidneys. Normal bladder.  Stomach/Bowel: No small bowel dilatation or obstruction. No pneumatosis, pneumoperitoneum or portal venous gas. Multiple small gastric varices.  Vascular/Lymphatic: Normal caliber abdominal aorta. No abdominal or pelvic lymphadenopathy.  Other: No focal fluid collection or hematoma. Generalized body wall edema.  Musculoskeletal: No acute osseous abnormality. No lytic or sclerotic osseous lesion.  IMPRESSION: 1. Cirrhotic liver.  Massive ascites. 2. Cholelithiasis. 3. Distal esophageal dilatation with fluid in the esophagus which may reflect gastroesophageal reflux.   Electronically Signed   By: Elige Ko   On: 03/20/2015 10:23   I have personally reviewed and evaluated these images and lab results as part of my medical decision-making.   EKG Interpretation   Date/Time:  Saturday March 20 2015 07:40:50 EDT Ventricular Rate:  124 PR Interval:  120 QRS Duration: 70 QT Interval:  351 QTC Calculation: 504 R Axis:   -21 Text Interpretation:   Sinus tachycardia LVH with secondary repolarization  abnormality Anterior Q waves, possibly due to LVH Prolonged QT interval No  significant change since last tracing Confirmed by FLOYD MD, DANIEL  ([id number]) on 03/20/2015 7:58:37 AM      Procedure note: Ultrasound Guided Peripheral IV Ultrasound guided peripheral 1.88  inch angiocath IV placement performed by me. Indications: Nursing unable to place IV. Details: The antecubital fossa and upper arm were evaluated with a multifrequency linear probe. Patent brachial veins were noted. 1 attempt was made to cannulate a vein under realtime US guidance with successful cannulation of the vein and catheter placement. There is return of non-pulsatile dark red blood. The patient tolerated the procedure well without complications. Images archived electronically.  CPT codes: [address] and [phone] Procedure note: Ultrasound Guided Peripheral IV Ultrasound guided peripheral 1.88 inch angiocath IV placement performed by me. Indications: Nursing unable to place IV. Details: The antecubital fossa and upper arm were evaluated with a multifrequency linear probe. Patent brachial veins were noted. 1 attempt was made to cannulate a vein under realtime US guidance with successful cannulation of the vein and catheter placement. There is return of non-pulsatile dark red blood. The patient tolerated the procedure well without complications. Images archived electronically.  CPT codes: 09811 and [phone]   MDM   Final diagnoses:  Upper GI bleed    49 yo M with a chief complaint of vomiting bright red blood. Patient with a history of varices concern for variceal bleeding. Will start octreotide give Protonix. IV fluids i-STAT Chem-8. Rocephin. Patient tachycardic into the 120s on arrival.   2 Large bore IV placed by myself with US guidance.  Patient complaining of all over pain on reassessment.    Patient's blood pressure becoming a little more soft while in the ED. Had a pressure  in the 90s. No active bleeding on exam, will give another liter of normal saline. CT scan performed found to have fluid backing into the esophagus, concern for possible hemorrhage into the stomach. Discussed case with Dr. Adela Lank, GI, recommend admission will likely perform EGD. Recommended critical care admission.   CRITICAL CARE Performed by: Rae Roam   Total critical care time: 35 min  Critical care time was exclusive of separately billable procedures and treating other patients.  Critical care was necessary to treat or prevent imminent or life-threatening deterioration.  Critical care was time spent personally by me on the following activities: development of treatment plan with patient and/or surrogate as well as nursing, discussions with consultants, evaluation of patient's response to treatment, examination of patient, obtaining history from patient or surrogate, ordering and performing treatments and interventions, ordering and review of laboratory studies, ordering and review of radiographic studies, pulse oximetry and re-evaluation of patient's condition.   The patients results and plan were reviewed and discussed.   Any x-rays performed were independently reviewed by myself.   Differential diagnosis were considered with the presenting HPI.  Medications  octreotide (SANDOSTATIN) 500 mcg in sodium chloride 0.9 % 250 mL (2 mcg/mL) infusion (50 mcg/hr Intravenous Transfusing/Transfer 03/20/15 1237)  0.9 %  sodium chloride infusion ( Intravenous New Bag/Given 03/20/15 1318)  ondansetron (ZOFRAN) injection 4 mg (4 mg Intravenous Given 03/20/15 1315)  phytonadione (VITAMIN K) 5 mg in dextrose 5 % 50 mL IVPB (5 mg Intravenous Given 03/20/15 1318)  0.9 %  sodium chloride infusion (not administered)  pantoprazole (PROTONIX) 80 mg in sodium chloride 0.9 % 250 mL (0.32 mg/mL) infusion (not administered)  cefTRIAXone (ROCEPHIN) 1 g in dextrose 5 % 50 mL IVPB (not administered)   pantoprazole (PROTONIX) 80 mg in sodium chloride 0.9 % 100 mL IVPB (0 mg Intravenous Stopped 03/20/15 0901)  0.9 %  sodium chloride infusion (0 mLs Intravenous Stopped 03/20/15 1005)  cefTRIAXone (ROCEPHIN) 1 g in dextrose 5 %  50 mL IVPB (0 g Intravenous Stopped 03/20/15 1005)  morphine 4 MG/ML injection 4 mg (4 mg Intravenous Given 03/20/15 0842)  ondansetron (ZOFRAN) injection 4 mg (4 mg Intravenous Given 03/20/15 0843)  octreotide (SANDOSTATIN) 2 mcg/mL load via infusion 50 mcg (50 mcg Intravenous Given 03/20/15 1005)  sodium chloride 0.9 % bolus 1,000 mL (0 mLs Intravenous Stopped 03/20/15 1155)    Filed Vitals:   03/20/15 1145 03/20/15 1200 03/20/15 1300 03/20/15 1315  BP: 115/80 112/84 116/71   Pulse: 120 124 126   Temp:    97.7 F (36.5 C)  TempSrc:    Oral  Resp: 13 21 14    Height:      SpO2: 100% 100% 100%     Final diagnoses:  Upper GI bleed    Admission/ observation were discussed with the admitting physician, patient and/or family and they are comfortable with the plan.     Melene Plan, DO 03/20/15 1340

## 2015-03-20 NOTE — Op Note (Addendum)
[facility] [address] [place], [address]   ENDOSCOPY PROCEDURE REPORT  PATIENT: Francisco, [name]  MR#: [id number] BIRTHDATE: 1966-02-28 , 49  yrs. old GENDER: male ENDOSCOPIST: Ileene Patrick, MD REFERRED BY: PROCEDURE DATE:  03/20/2015 PROCEDURE:  EGD, diagnostic ASA CLASS:     Class IV INDICATIONS:  melena. MEDICATIONS: Residual sedation present TOPICAL ANESTHETIC:  DESCRIPTION OF PROCEDURE: After the risks benefits and alternatives of the procedure were thoroughly explained, informed consent was obtained.  The Pentax Gastroscope [id number] endoscope was introduced through the mouth and advanced to the second portion of the duodenum , Without limitations.  The instrument was slowly withdrawn as the mucosa was fully examined.    FINDINGS: There was diffuse ulceration / esophagitis in the distal esophagus, without obvious esophageal varices.  There was no high risk stigmata for bleeding.  DH, GEJ, and SCJ located 38cm from the incisors.  There was no evidence of gastric varices on retroflexed views.  There was portal hypertensive gastropathy noted throughout the examined stomach, without focal ulceration or erosion.  Old heme was noted throughout the examined stomach.  The duodenal bulb and 2nd portion of the duodenum was normal.  Retroflexed views revealed as previously described.     The scope was then withdrawn from the patient and the procedure completed.  COMPLICATIONS: There were no immediate complications.  ENDOSCOPIC IMPRESSION: Severe diffuse distal esophageal ulceration / esophagitis, perhaps in part related to prior history of esophageal banding, although is more extensive than would normally see with banding. No high risk stigmata of bleeding appreciated. No gastric varices Portal hypertensive gastropathy  RECOMMENDATIONS: Continue NPO today given recent bleeding, clears tomorrow as tolerated Continue IV protonix Liquid  carafate 5-10cc PO every 6 hours Can stop octreotide drip Large volume paracentesis, rule out SBP, when patient stable Continue ceftriaxone in light of recent GI bleeding Lactulose and rifaximin when patient starts tolerating PO Consider serologies for HSV, CMV given severe esophageal ulcerations noted GI service will continue to follow   eSigned:  Ileene Patrick, MD 03/20/2015 4:34 PM Revised: 03/20/2015 4:34 PM   CC:  PATIENT NAME:  Francisco, [name] MR#: [id number]

## 2015-03-20 NOTE — ED Notes (Signed)
49 yom presents via EMS with c/o vomiting dark red blood x2 since last night. Patient states he has blood in his stool, both bright red and dark. + hx of stomach ulcers. Patient has an appointment on Monday 09/19 to get a fluid removed from abdomen d/t cirrosis. Patient c/o nausea. Abdomen is distended and tender on palpation.

## 2015-03-20 NOTE — Progress Notes (Signed)
Per GI MD, Dr Adela Lank, stop Octreotide and infuse Protonix instead.

## 2015-03-20 NOTE — Interval H&P Note (Signed)
History and Physical Interval Note:  03/20/2015 2:49 PM  Francisco [name]  has presented today for surgery, with the diagnosis of melena  The various methods of treatment have been discussed with the patient and family. After consideration of risks, benefits and other options for treatment, the patient has consented to  Procedure(s): ESOPHAGOGASTRODUODENOSCOPY (EGD) (N/A) as a surgical intervention .  The patient's history has been reviewed, patient examined, no change in status, stable for surgery.  I have reviewed the patient's chart and labs.  Questions were answered to the patient's satisfaction.     Reeves Forth Armbruster

## 2015-03-20 NOTE — ED Notes (Signed)
Critical care at bedside  

## 2015-03-20 NOTE — Consult Note (Signed)
Consultation  Referring Provider:     Langston Masker Primary Care Physician:  Pcp Not In System Primary Gastroenterologist:         Reason for Consultation:     GI bleed         HPI:   Francisco [name] is a 49 y.o. male with decompensated alcohol cirrhosis however the family also reports a history of hepatitis C. He has a history of prior variceal bleeds, ascites, hepatic encephalopathy, and jaundice. He has been evaluated for liver transplant in [place] per family but has not been listed. They deny recent alcohol use. He had banding of esophageal varices a few months ago, and presented with a GI bleed in early August to our hospital. EGD showed ulcerations at the site of prior banding and no endoscopic therapy was performed. He had SBP at the time and was intubated. He was eventually discharged and was scheduled for outpatient paracentesis this upcoming Monday however family brought him to the ED today after experiencing multiple episodes of hematemesis overnight. Vomiting bright red blood, no melena. They also report this is the most distended his abdomen has ever been. No fevers endorsed. Wife reports the patient is confused and not thinking clearly. Hgb in the 8s. Patient tachycardic in the ER to 120s. I evaluated at him at the bedside with the critical care team. He feels nauseated and is continuing to vomit blood.   Past Medical History  Diagnosis Date  . Hepatic cirrhosis   . Esophageal varices     last banding in [place] June 2016  . Chronic renal failure     was on hemodialysis in June 2016  . Congestive heart failure     questionable hx & on lasix  . Ascites   . Renal insufficiency     Past Surgical History  Procedure Laterality Date  . Esophagogastroduodenoscopy w/ banding    . Esophagogastroduodenoscopy N/A 02/06/2015    Procedure: ESOPHAGOGASTRODUODENOSCOPY (EGD);  Surgeon: Rachael Fee, MD;  Location: [facility];  Service: Endoscopy;  Laterality: N/A;  at bedside in ICU     Family History  Problem Relation Age of Onset  . COPD Mother   . Asthma Mother   . Leukemia Maternal Grandmother   . Congestive Heart Failure Maternal Grandfather   . Cancer Other     2 aunts with known ca     Social History  Substance Use Topics  . Smoking status: Former Smoker    Quit date: 10/02/2014  . Smokeless tobacco: Never Used     Comment: off and on for 20 years  . Alcohol Use: No     Comment: Quit drinking EtOH prior to prolonged admission in April 2016    Prior to Admission medications   Medication Sig Start Date End Date Taking? Authorizing Provider  albuterol (PROVENTIL HFA;VENTOLIN HFA) 108 (90 BASE) MCG/ACT inhaler Inhale 1-2 puffs into the lungs every 6 (six) hours as needed for wheezing or shortness of breath. 03/03/15  Yes Jaclyn Shaggy, MD  Ca Carbonate-Mag Hydroxide (ROLAIDS) 550-110 MG CHEW Chew 1 tablet by mouth 3 (three) times daily as needed (for heartburn).    Yes Historical Provider, MD  feeding supplement, ENSURE ENLIVE, (ENSURE ENLIVE) LIQD Take 237 mLs by mouth 2 (two) times daily between meals. 02/26/15  Yes Maryruth Bun [name], MD  furosemide (LASIX) 40 MG tablet Take 0.5 tablets (20 mg total) by mouth 2 (two) times daily. 03/03/15  Yes Jaclyn Shaggy, MD  hydrocortisone 1 %  lotion Apply 1 application topically 2 (two) times daily. 03/18/15  Yes Jaclyn Shaggy, MD  lactulose (CHRONULAC) 10 GM/15ML solution Take 45 mLs (30 g total) by mouth every 8 (eight) hours. 02/26/15  Yes Maryruth Bun [name], MD  magnesium oxide (MAG-OX) 400 (241.3 MG) MG tablet Take 1 tablet (400 mg total) by mouth 2 (two) times daily. 02/26/15  Yes Maryruth Bun [name], MD  nystatin (MYCOSTATIN/NYSTOP) 100000 UNIT/GM POWD Apply 1 Bottle topically 3 (three) times daily. 03/10/15  Yes Jaclyn Shaggy, MD  oxyCODONE (OXY IR/ROXICODONE) 5 MG immediate release tablet Take 5 mg by mouth every 4 (four) hours as needed for moderate pain.  02/26/15  Yes Historical Provider, MD  potassium chloride SA  (K-DUR,KLOR-CON) 20 MEQ tablet Take 1 tablet (20 mEq total) by mouth 3 (three) times daily. 02/26/15  Yes Maryruth Bun [name], MD  spironolactone (ALDACTONE) 25 MG tablet Take 1 tablet (25 mg total) by mouth daily. 03/10/15  Yes Jaclyn Shaggy, MD  traMADol (ULTRAM) 50 MG tablet Take 1 tablet (50 mg total) by mouth every 12 (twelve) hours as needed. Patient taking differently: Take 50 mg by mouth every 12 (twelve) hours as needed for moderate pain.  03/18/15  Yes Jaclyn Shaggy, MD    Current Facility-Administered Medications  Medication Dose Route Frequency Provider Last Rate Last Dose  . 0.9 %  sodium chloride infusion   Intravenous Continuous Bernadene Person, NP      . 0.9 %  sodium chloride infusion   Intravenous Once Bernadene Person, NP      . octreotide (SANDOSTATIN) 500 mcg in sodium chloride 0.9 % 250 mL (2 mcg/mL) infusion  50 mcg/hr Intravenous Continuous Melene Plan, DO 25 mL/hr at 03/20/15 0837 50 mcg/hr at 03/20/15 0837  . ondansetron (ZOFRAN) injection 4 mg  4 mg Intravenous Q6H PRN Bernadene Person, NP      . pantoprazole (PROTONIX) 80 mg in sodium chloride 0.9 % 100 mL IVPB  80 mg Intravenous Continuous Bernadene Person, NP      . phytonadione (VITAMIN K) 5 mg in dextrose 5 % 50 mL IVPB  5 mg Intravenous Once Bernadene Person, NP       Current Outpatient Prescriptions  Medication Sig Dispense Refill  . albuterol (PROVENTIL HFA;VENTOLIN HFA) 108 (90 BASE) MCG/ACT inhaler Inhale 1-2 puffs into the lungs every 6 (six) hours as needed for wheezing or shortness of breath. 1 each 2  . Ca Carbonate-Mag Hydroxide (ROLAIDS) 550-110 MG CHEW Chew 1 tablet by mouth 3 (three) times daily as needed (for heartburn).     . feeding supplement, ENSURE ENLIVE, (ENSURE ENLIVE) LIQD Take 237 mLs by mouth 2 (two) times daily between meals. 237 mL 12  . furosemide (LASIX) 40 MG tablet Take 0.5 tablets (20 mg total) by mouth 2 (two) times daily. 60 tablet 2  . hydrocortisone 1 % lotion Apply 1  application topically 2 (two) times daily. 118 mL 1  . lactulose (CHRONULAC) 10 GM/15ML solution Take 45 mLs (30 g total) by mouth every 8 (eight) hours. 240 mL 3  . magnesium oxide (MAG-OX) 400 (241.3 MG) MG tablet Take 1 tablet (400 mg total) by mouth 2 (two) times daily. 60 tablet 3  . nystatin (MYCOSTATIN/NYSTOP) 100000 UNIT/GM POWD Apply 1 Bottle topically 3 (three) times daily. 120 g 2  . oxyCODONE (OXY IR/ROXICODONE) 5 MG immediate release tablet Take 5 mg by mouth every 4 (four) hours as needed for moderate pain.   0  .  potassium chloride SA (K-DUR,KLOR-CON) 20 MEQ tablet Take 1 tablet (20 mEq total) by mouth 3 (three) times daily. 90 tablet 3  . spironolactone (ALDACTONE) 25 MG tablet Take 1 tablet (25 mg total) by mouth daily. 30 tablet 2  . traMADol (ULTRAM) 50 MG tablet Take 1 tablet (50 mg total) by mouth every 12 (twelve) hours as needed. (Patient taking differently: Take 50 mg by mouth every 12 (twelve) hours as needed for moderate pain. ) 30 tablet 0    Allergies as of 03/20/2015  . (No Known Allergies)     Review of Systems:    As per HPI, otherwise negative    Physical Exam:  Vital signs in last 24 hours: Temp:  [98.7 F (37.1 C)] 98.7 F (37.1 C) (09/17 0746) Pulse Rate:  [118-125] 120 (09/17 1145) Resp:  [12-20] 13 (09/17 1145) BP: (101-116)/(74-83) 115/80 mmHg (09/17 1145) SpO2:  [100 %] 100 % (09/17 1145)   General:   AA male in bed, with emesis basin, blood noted in his mouth Head:  Normocephalic and atraumatic. Eyes:   Scleral icterus.    Ears:  Normal auditory acuity. Neck:  Supple Lungs:  Respirations even  Lungs clear to auscultation bilaterally anteriorly.   No wheezes, crackles, or rhonchi.  Heart:  tachycardic; no MRG Abdomen:  Massively distended and tender to the touch. Normal bowel sounds.  Rectal:  Not performed.  Msk:  Symmetrical without gross deformities.  Extremities:  (+) 2-3 edema LE B. Neurologic:  (+) asterixis, slow to respond to  questions, seems confused Skin:  Intact without significant lesions or rashes. Psych:  Alert and cooperative. Normal affect.  LAB RESULTS:  Recent Labs  03/18/15 1103 03/20/15 0837 03/20/15 0848  WBC 7.3  --  9.3  HGB 9.1* 10.5* 8.3*  HCT 27.1* 31.0* 24.9*  PLT 123*  --  114*   BMET  Recent Labs  03/18/15 1046 03/20/15 0837 03/20/15 0848  NA 136 138 133*  K 4.4 4.0 4.0  CL 104 99* 102  CO2 23  --  24  GLUCOSE 109* 106* 110*  BUN 5* 6 <5*  CREATININE 1.18 1.30* 1.40*  CALCIUM 8.3*  --  8.0*   LFT  Recent Labs  03/20/15 0848  PROT 7.6  ALBUMIN 1.4*  AST 64*  ALT 23  ALKPHOS 75  BILITOT 2.4*   PT/INR  Recent Labs  03/20/15 0848  LABPROT 27.2*  INR 2.57*    STUDIES: Ct Abdomen Pelvis Wo Contrast  03/20/2015   CLINICAL DATA:  Vomiting blood twice last night.  Blood in stool.  EXAM: CT ABDOMEN AND PELVIS WITHOUT CONTRAST  TECHNIQUE: Multidetector CT imaging of the abdomen and pelvis was performed following the standard protocol without IV contrast.  COMPARISON:  None.  FINDINGS: Lower chest: Clear lung bases. Normal heart size. Distal esophageal dilatation with fluid within the esophagus.  Hepatobiliary: Diminutive size of the liver with a micronodular contour. No focal hepatic mass. Cholelithiasis. No intrahepatic or extrahepatic biliary duct dilatation. Massive ascites.  Pancreas: Normal.  Spleen: Normal.  Adrenals/Urinary Tract: Normal adrenal glands. Normal kidneys. Normal bladder.  Stomach/Bowel: No small bowel dilatation or obstruction. No pneumatosis, pneumoperitoneum or portal venous gas. Multiple small gastric varices.  Vascular/Lymphatic: Normal caliber abdominal aorta. No abdominal or pelvic lymphadenopathy.  Other: No focal fluid collection or hematoma. Generalized body wall edema.  Musculoskeletal: No acute osseous abnormality. No lytic or sclerotic osseous lesion.  IMPRESSION: 1. Cirrhotic liver.  Massive ascites. 2. Cholelithiasis. 3. Distal esophageal  dilatation with fluid in the esophagus which may reflect gastroesophageal reflux.   Electronically Signed   By: Elige Ko   On: 03/20/2015 10:23     PREVIOUS ENDOSCOPIES:            August 6th EGD - ulcer at site of prior banding, no active varices appreciated.   Impression / Plan:  49 y/o male with reported decompensated alcoholic cirrhosis, with h/o variceal bleeding, hepatic encephalopathy, and ascites / SBP. MELD currently 24. Not listed for transplant. The patient presents with overt hematemesis and massive ascites. He is tachycardic with anemia, Hgb of 8.   This patient is critically ill at this time with suspected variceal bleed, also suspected SBP, and hepatic encephalopathy. He is not currently listed for transplant, family reports they plan to re-engage on this process in hopes of listing him in the future. I spent time with the patient and family and critical care team to discuss his wishes in this setting and how aggressive they wish [place] to be. At this time they wish to have everything done. They understand his risk for mortality is high during this admission. I am recommending he be intubated for airway protection and to perform endoscopy.   Recommend the following: -discussed with critical care, patient will be intubated for airway protection and EGD -transfuse for Hgb goal around 8 as needed -octreotide drip -protonix drip -empiric ceftriaxone -vitamin K -FFP prior to endoscopy -EGD when patient has been intubated. He is currently in process being transported to ICU where his procedure will be performed. If EGD not successful will have him evaluate for TIPS. He does have history of encephalopathy but will still have him evaluated. -once stablized, plan for large volume paracentesis and adjustment of diuretics, and will give lactulose/rifaximin for encephalopathy -NPO  The indications, risks, and benefits of EGD were explained to the patient in detail. Risks include but not  limited to bleeding, perforation, adverse reaction to medications, cardiopulmonary compromise. Patient has high risk of mortality during this admission. Patient and family verbalized understanding and wished to proceed. All questions answered. Further recommendations pending result of the exam.   Ileene Patrick, MD [facility] Pager [phone]     LOS: 0 days   Reeves Forth Stefany Starace  03/20/2015, 11:59 AM

## 2015-03-20 NOTE — Progress Notes (Signed)
Utilization Review Completed for this 49 y.o M admitted to the ICU.  Vomiting blood with Tachycardia and blood in stools. Receiving Vitamin K, Protonix,  Blood and Plasma. Hx Hepatic Cirrhosis and Esophageal Varices. Last admit 02/2015. CM will follow for Disposition and Discharge needs.

## 2015-03-20 NOTE — ED Notes (Signed)
GI at bedside

## 2015-03-20 NOTE — H&P (View-Only) (Signed)
Consultation  Referring Provider:     Langston Masker Primary Care Physician:  Pcp Not In System Primary Gastroenterologist:         Reason for Consultation:     GI bleed         HPI:   Francisco [name] is a 49 y.o. male with decompensated alcohol cirrhosis however the family also reports a history of hepatitis C. He has a history of prior variceal bleeds, ascites, hepatic encephalopathy, and jaundice. He has been evaluated for liver transplant in [place] per family but has not been listed. They deny recent alcohol use. He had banding of esophageal varices a few months ago, and presented with a GI bleed in early August to our hospital. EGD showed ulcerations at the site of prior banding and no endoscopic therapy was performed. He had SBP at the time and was intubated. He was eventually discharged and was scheduled for outpatient paracentesis this upcoming Monday however family brought him to the ED today after experiencing multiple episodes of hematemesis overnight. Vomiting bright red blood, no melena. They also report this is the most distended his abdomen has ever been. No fevers endorsed. Wife reports the patient is confused and not thinking clearly. Hgb in the 8s. Patient tachycardic in the ER to 120s. I evaluated at him at the bedside with the critical care team. He feels nauseated and is continuing to vomit blood.   Past Medical History  Diagnosis Date  . Hepatic cirrhosis   . Esophageal varices     last banding in [place] June 2016  . Chronic renal failure     was on hemodialysis in June 2016  . Congestive heart failure     questionable hx & on lasix  . Ascites   . Renal insufficiency     Past Surgical History  Procedure Laterality Date  . Esophagogastroduodenoscopy w/ banding    . Esophagogastroduodenoscopy N/A 02/06/2015    Procedure: ESOPHAGOGASTRODUODENOSCOPY (EGD);  Surgeon: Rachael Fee, MD;  Location: [facility];  Service: Endoscopy;  Laterality: N/A;  at bedside in ICU     Family History  Problem Relation Age of Onset  . COPD Mother   . Asthma Mother   . Leukemia Maternal Grandmother   . Congestive Heart Failure Maternal Grandfather   . Cancer Other     2 aunts with known ca     Social History  Substance Use Topics  . Smoking status: Former Smoker    Quit date: 10/02/2014  . Smokeless tobacco: Never Used     Comment: off and on for 20 years  . Alcohol Use: No     Comment: Quit drinking EtOH prior to prolonged admission in April 2016    Prior to Admission medications   Medication Sig Start Date End Date Taking? Authorizing Provider  albuterol (PROVENTIL HFA;VENTOLIN HFA) 108 (90 BASE) MCG/ACT inhaler Inhale 1-2 puffs into the lungs every 6 (six) hours as needed for wheezing or shortness of breath. 03/03/15  Yes Jaclyn Shaggy, MD  Ca Carbonate-Mag Hydroxide (ROLAIDS) 550-110 MG CHEW Chew 1 tablet by mouth 3 (three) times daily as needed (for heartburn).    Yes Historical Provider, MD  feeding supplement, ENSURE ENLIVE, (ENSURE ENLIVE) LIQD Take 237 mLs by mouth 2 (two) times daily between meals. 02/26/15  Yes Maryruth Bun [name], MD  furosemide (LASIX) 40 MG tablet Take 0.5 tablets (20 mg total) by mouth 2 (two) times daily. 03/03/15  Yes Jaclyn Shaggy, MD  hydrocortisone 1 %  lotion Apply 1 application topically 2 (two) times daily. 03/18/15  Yes Jaclyn Shaggy, MD  lactulose (CHRONULAC) 10 GM/15ML solution Take 45 mLs (30 g total) by mouth every 8 (eight) hours. 02/26/15  Yes Maryruth Bun [name], MD  magnesium oxide (MAG-OX) 400 (241.3 MG) MG tablet Take 1 tablet (400 mg total) by mouth 2 (two) times daily. 02/26/15  Yes Maryruth Bun [name], MD  nystatin (MYCOSTATIN/NYSTOP) 100000 UNIT/GM POWD Apply 1 Bottle topically 3 (three) times daily. 03/10/15  Yes Jaclyn Shaggy, MD  oxyCODONE (OXY IR/ROXICODONE) 5 MG immediate release tablet Take 5 mg by mouth every 4 (four) hours as needed for moderate pain.  02/26/15  Yes Historical Provider, MD  potassium chloride SA  (K-DUR,KLOR-CON) 20 MEQ tablet Take 1 tablet (20 mEq total) by mouth 3 (three) times daily. 02/26/15  Yes Maryruth Bun [name], MD  spironolactone (ALDACTONE) 25 MG tablet Take 1 tablet (25 mg total) by mouth daily. 03/10/15  Yes Jaclyn Shaggy, MD  traMADol (ULTRAM) 50 MG tablet Take 1 tablet (50 mg total) by mouth every 12 (twelve) hours as needed. Patient taking differently: Take 50 mg by mouth every 12 (twelve) hours as needed for moderate pain.  03/18/15  Yes Jaclyn Shaggy, MD    Current Facility-Administered Medications  Medication Dose Route Frequency Provider Last Rate Last Dose  . 0.9 %  sodium chloride infusion   Intravenous Continuous Bernadene Person, NP      . 0.9 %  sodium chloride infusion   Intravenous Once Bernadene Person, NP      . octreotide (SANDOSTATIN) 500 mcg in sodium chloride 0.9 % 250 mL (2 mcg/mL) infusion  50 mcg/hr Intravenous Continuous Melene Plan, DO 25 mL/hr at 03/20/15 0837 50 mcg/hr at 03/20/15 0837  . ondansetron (ZOFRAN) injection 4 mg  4 mg Intravenous Q6H PRN Bernadene Person, NP      . pantoprazole (PROTONIX) 80 mg in sodium chloride 0.9 % 100 mL IVPB  80 mg Intravenous Continuous Bernadene Person, NP      . phytonadione (VITAMIN K) 5 mg in dextrose 5 % 50 mL IVPB  5 mg Intravenous Once Bernadene Person, NP       Current Outpatient Prescriptions  Medication Sig Dispense Refill  . albuterol (PROVENTIL HFA;VENTOLIN HFA) 108 (90 BASE) MCG/ACT inhaler Inhale 1-2 puffs into the lungs every 6 (six) hours as needed for wheezing or shortness of breath. 1 each 2  . Ca Carbonate-Mag Hydroxide (ROLAIDS) 550-110 MG CHEW Chew 1 tablet by mouth 3 (three) times daily as needed (for heartburn).     . feeding supplement, ENSURE ENLIVE, (ENSURE ENLIVE) LIQD Take 237 mLs by mouth 2 (two) times daily between meals. 237 mL 12  . furosemide (LASIX) 40 MG tablet Take 0.5 tablets (20 mg total) by mouth 2 (two) times daily. 60 tablet 2  . hydrocortisone 1 % lotion Apply 1  application topically 2 (two) times daily. 118 mL 1  . lactulose (CHRONULAC) 10 GM/15ML solution Take 45 mLs (30 g total) by mouth every 8 (eight) hours. 240 mL 3  . magnesium oxide (MAG-OX) 400 (241.3 MG) MG tablet Take 1 tablet (400 mg total) by mouth 2 (two) times daily. 60 tablet 3  . nystatin (MYCOSTATIN/NYSTOP) 100000 UNIT/GM POWD Apply 1 Bottle topically 3 (three) times daily. 120 g 2  . oxyCODONE (OXY IR/ROXICODONE) 5 MG immediate release tablet Take 5 mg by mouth every 4 (four) hours as needed for moderate pain.   0  .  potassium chloride SA (K-DUR,KLOR-CON) 20 MEQ tablet Take 1 tablet (20 mEq total) by mouth 3 (three) times daily. 90 tablet 3  . spironolactone (ALDACTONE) 25 MG tablet Take 1 tablet (25 mg total) by mouth daily. 30 tablet 2  . traMADol (ULTRAM) 50 MG tablet Take 1 tablet (50 mg total) by mouth every 12 (twelve) hours as needed. (Patient taking differently: Take 50 mg by mouth every 12 (twelve) hours as needed for moderate pain. ) 30 tablet 0    Allergies as of 03/20/2015  . (No Known Allergies)     Review of Systems:    As per HPI, otherwise negative    Physical Exam:  Vital signs in last 24 hours: Temp:  [98.7 F (37.1 C)] 98.7 F (37.1 C) (09/17 0746) Pulse Rate:  [118-125] 120 (09/17 1145) Resp:  [12-20] 13 (09/17 1145) BP: (101-116)/(74-83) 115/80 mmHg (09/17 1145) SpO2:  [100 %] 100 % (09/17 1145)   General:   AA male in bed, with emesis basin, blood noted in his mouth Head:  Normocephalic and atraumatic. Eyes:   Scleral icterus.    Ears:  Normal auditory acuity. Neck:  Supple Lungs:  Respirations even  Lungs clear to auscultation bilaterally anteriorly.   No wheezes, crackles, or rhonchi.  Heart:  tachycardic; no MRG Abdomen:  Massively distended and tender to the touch. Normal bowel sounds.  Rectal:  Not performed.  Msk:  Symmetrical without gross deformities.  Extremities:  (+) 2-3 edema LE B. Neurologic:  (+) asterixis, slow to respond to  questions, seems confused Skin:  Intact without significant lesions or rashes. Psych:  Alert and cooperative. Normal affect.  LAB RESULTS:  Recent Labs  03/18/15 1103 03/20/15 0837 03/20/15 0848  WBC 7.3  --  9.3  HGB 9.1* 10.5* 8.3*  HCT 27.1* 31.0* 24.9*  PLT 123*  --  114*   BMET  Recent Labs  03/18/15 1046 03/20/15 0837 03/20/15 0848  NA 136 138 133*  K 4.4 4.0 4.0  CL 104 99* 102  CO2 23  --  24  GLUCOSE 109* 106* 110*  BUN 5* 6 <5*  CREATININE 1.18 1.30* 1.40*  CALCIUM 8.3*  --  8.0*   LFT  Recent Labs  03/20/15 0848  PROT 7.6  ALBUMIN 1.4*  AST 64*  ALT 23  ALKPHOS 75  BILITOT 2.4*   PT/INR  Recent Labs  03/20/15 0848  LABPROT 27.2*  INR 2.57*    STUDIES: Ct Abdomen Pelvis Wo Contrast  03/20/2015   CLINICAL DATA:  Vomiting blood twice last night.  Blood in stool.  EXAM: CT ABDOMEN AND PELVIS WITHOUT CONTRAST  TECHNIQUE: Multidetector CT imaging of the abdomen and pelvis was performed following the standard protocol without IV contrast.  COMPARISON:  None.  FINDINGS: Lower chest: Clear lung bases. Normal heart size. Distal esophageal dilatation with fluid within the esophagus.  Hepatobiliary: Diminutive size of the liver with a micronodular contour. No focal hepatic mass. Cholelithiasis. No intrahepatic or extrahepatic biliary duct dilatation. Massive ascites.  Pancreas: Normal.  Spleen: Normal.  Adrenals/Urinary Tract: Normal adrenal glands. Normal kidneys. Normal bladder.  Stomach/Bowel: No small bowel dilatation or obstruction. No pneumatosis, pneumoperitoneum or portal venous gas. Multiple small gastric varices.  Vascular/Lymphatic: Normal caliber abdominal aorta. No abdominal or pelvic lymphadenopathy.  Other: No focal fluid collection or hematoma. Generalized body wall edema.  Musculoskeletal: No acute osseous abnormality. No lytic or sclerotic osseous lesion.  IMPRESSION: 1. Cirrhotic liver.  Massive ascites. 2. Cholelithiasis. 3. Distal esophageal  dilatation with fluid in the esophagus which may reflect gastroesophageal reflux.   Electronically Signed   By: Elige Ko   On: 03/20/2015 10:23     PREVIOUS ENDOSCOPIES:            August 6th EGD - ulcer at site of prior banding, no active varices appreciated.   Impression / Plan:  49 y/o male with reported decompensated alcoholic cirrhosis, with h/o variceal bleeding, hepatic encephalopathy, and ascites / SBP. MELD currently 24. Not listed for transplant. The patient presents with overt hematemesis and massive ascites. He is tachycardic with anemia, Hgb of 8.   This patient is critically ill at this time with suspected variceal bleed, also suspected SBP, and hepatic encephalopathy. He is not currently listed for transplant, family reports they plan to re-engage on this process in hopes of listing him in the future. I spent time with the patient and family and critical care team to discuss his wishes in this setting and how aggressive they wish [place] to be. At this time they wish to have everything done. They understand his risk for mortality is high during this admission. I am recommending he be intubated for airway protection and to perform endoscopy.   Recommend the following: -discussed with critical care, patient will be intubated for airway protection and EGD -transfuse for Hgb goal around 8 as needed -octreotide drip -protonix drip -empiric ceftriaxone -vitamin K -FFP prior to endoscopy -EGD when patient has been intubated. He is currently in process being transported to ICU where his procedure will be performed. If EGD not successful will have him evaluate for TIPS. He does have history of encephalopathy but will still have him evaluated. -once stablized, plan for large volume paracentesis and adjustment of diuretics, and will give lactulose/rifaximin for encephalopathy -NPO  The indications, risks, and benefits of EGD were explained to the patient in detail. Risks include but not  limited to bleeding, perforation, adverse reaction to medications, cardiopulmonary compromise. Patient has high risk of mortality during this admission. Patient and family verbalized understanding and wished to proceed. All questions answered. Further recommendations pending result of the exam.   Ileene Patrick, MD [facility] Pager [phone]     LOS: 0 days   Reeves Forth Armbruster  03/20/2015, 11:59 AM

## 2015-03-20 NOTE — Progress Notes (Signed)
RT retracted pt ETT 3cm per Dr. Kavin Leech order. Patient tolerated well. Vital signs stable throughout. No complications. RT will continue to monitor.

## 2015-03-20 NOTE — H&P (Signed)
PULMONARY / CRITICAL CARE MEDICINE   Name: Francisco [name] MRN: [id number] DOB: 11/24/1965    ADMISSION DATE:  03/20/2015  REFERRING MD :  EDP  CHIEF COMPLAINT:  GI bleed, hypotension   INITIAL PRESENTATION: 49 yo male with hx ETOH cirrhosis, varices, chronic renal failure, ascites with recent admit (august) for sepsis r/t SBP requiring elective intubation for endoscopy during that admission.  Was d/c to SNF.  Returns 9/17 with hematemesis and melena x 1 day.  Mild hypotension, anemia and PCCM called to admit.   STUDIES:    SIGNIFICANT EVENTS:    HISTORY OF PRESENT ILLNESS:  49 yo male with hx ETOH cirrhosis, varices, chronic renal failure, ascites with recent admit (august) for sepsis r/t SBP requiring elective intubation for endoscopy during that admission.  Was d/c to SNF.  Returns 9/17 with hematemesis and melena x 1 day.  Mild hypotension, anemia and PCCM called to admit.   Currently c/o abd pain, nausea.  Has had several episodes vomiting dark red blood and dark stools.  Had appt scheduled for Monday for paracentesis.   Denies chest pain, SOB, lightheadedness, fever, chills.   PAST MEDICAL HISTORY :   has a past medical history of Hepatic cirrhosis; Esophageal varices; Chronic renal failure; Congestive heart failure; Ascites; and Renal insufficiency.  has past surgical history that includes Esophagogastroduodenoscopy w/ banding and Esophagogastroduodenoscopy (N/A, 02/06/2015). Prior to Admission medications   Medication Sig Start Date End Date Taking? Authorizing Provider  albuterol (PROVENTIL HFA;VENTOLIN HFA) 108 (90 BASE) MCG/ACT inhaler Inhale 1-2 puffs into the lungs every 6 (six) hours as needed for wheezing or shortness of breath. 03/03/15  Yes Jaclyn Shaggy, MD  Ca Carbonate-Mag Hydroxide (ROLAIDS) 550-110 MG CHEW Chew 1 tablet by mouth 3 (three) times daily as needed (for heartburn).    Yes Historical Provider, MD  feeding supplement, ENSURE ENLIVE, (ENSURE ENLIVE) LIQD Take  237 mLs by mouth 2 (two) times daily between meals. 02/26/15  Yes Maryruth Bun [name], MD  furosemide (LASIX) 40 MG tablet Take 0.5 tablets (20 mg total) by mouth 2 (two) times daily. 03/03/15  Yes Jaclyn Shaggy, MD  hydrocortisone 1 % lotion Apply 1 application topically 2 (two) times daily. 03/18/15  Yes Jaclyn Shaggy, MD  lactulose (CHRONULAC) 10 GM/15ML solution Take 45 mLs (30 g total) by mouth every 8 (eight) hours. 02/26/15  Yes Maryruth Bun [name], MD  magnesium oxide (MAG-OX) 400 (241.3 MG) MG tablet Take 1 tablet (400 mg total) by mouth 2 (two) times daily. 02/26/15  Yes Maryruth Bun [name], MD  nystatin (MYCOSTATIN/NYSTOP) 100000 UNIT/GM POWD Apply 1 Bottle topically 3 (three) times daily. 03/10/15  Yes Jaclyn Shaggy, MD  oxyCODONE (OXY IR/ROXICODONE) 5 MG immediate release tablet Take 5 mg by mouth every 4 (four) hours as needed for moderate pain.  02/26/15  Yes Historical Provider, MD  potassium chloride SA (K-DUR,KLOR-CON) 20 MEQ tablet Take 1 tablet (20 mEq total) by mouth 3 (three) times daily. 02/26/15  Yes Maryruth Bun [name], MD  spironolactone (ALDACTONE) 25 MG tablet Take 1 tablet (25 mg total) by mouth daily. 03/10/15  Yes Jaclyn Shaggy, MD  traMADol (ULTRAM) 50 MG tablet Take 1 tablet (50 mg total) by mouth every 12 (twelve) hours as needed. Patient taking differently: Take 50 mg by mouth every 12 (twelve) hours as needed for moderate pain.  03/18/15  Yes Jaclyn Shaggy, MD   No Known Allergies  FAMILY HISTORY:  has no family status information on file.  SOCIAL HISTORY:  reports that  he quit smoking about 5 months ago. He has never used smokeless tobacco. He reports that he does not drink alcohol or use illicit drugs.  REVIEW OF SYSTEMS:  As per HPI - All other systems reviewed and were neg.    SUBJECTIVE:   VITAL SIGNS: Temp:  [98.7 F (37.1 C)] 98.7 F (37.1 C) (09/17 0746) Pulse Rate:  [118-125] 120 (09/17 1145) Resp:  [12-20] 13 (09/17 1145) BP: (101-116)/(74-83) 115/80 mmHg (09/17  1145) SpO2:  [100 %] 100 % (09/17 1145) HEMODYNAMICS:   VENTILATOR SETTINGS:   INTAKE / OUTPUT: No intake or output data in the 24 hours ending 03/20/15 1211  PHYSICAL EXAMINATION: General:  Chronically ill appearing male, NAD  Neuro:  Awake, alert, slow to respond but overall appropriate HEENT:  Mm dry, sclera icteric Cardiovascular:   s1s2 rrr, mild tachy Lungs:  resps even non labored on RA, diminished bases  Abdomen:  Grossly distended, ascites, -bs, tympanic, mildly tender diffusely  Musculoskeletal:  Warm and dry, 2+ BLE edema  LABS:  CBC  Recent Labs Lab 03/18/15 1103 03/20/15 0837 03/20/15 0848  WBC 7.3  --  9.3  HGB 9.1* 10.5* 8.3*  HCT 27.1* 31.0* 24.9*  PLT 123*  --  114*   Coag's  Recent Labs Lab 03/20/15 0848  APTT 48*  INR 2.57*   BMET  Recent Labs Lab 03/18/15 1046 03/20/15 0837 03/20/15 0848  NA 136 138 133*  K 4.4 4.0 4.0  CL 104 99* 102  CO2 23  --  24  BUN 5* 6 <5*  CREATININE 1.18 1.30* 1.40*  GLUCOSE 109* 106* 110*   Electrolytes  Recent Labs Lab 03/18/15 1046 03/20/15 0848  CALCIUM 8.3* 8.0*   Sepsis Markers  Recent Labs Lab 03/20/15 0805  LATICACIDVEN 4.0*   ABG No results for input(s): PHART, PCO2ART, PO2ART in the last 168 hours. Liver Enzymes  Recent Labs Lab 03/18/15 1046 03/20/15 0848  AST 56* 64*  ALT 22 23  ALKPHOS 77 75  BILITOT 2.5* 2.4*  ALBUMIN 1.6* 1.4*   Cardiac Enzymes No results for input(s): TROPONINI, PROBNP in the last 168 hours. Glucose No results for input(s): GLUCAP in the last 168 hours.  Imaging Ct Abdomen Pelvis Wo Contrast  03/20/2015   CLINICAL DATA:  Vomiting blood twice last night.  Blood in stool.  EXAM: CT ABDOMEN AND PELVIS WITHOUT CONTRAST  TECHNIQUE: Multidetector CT imaging of the abdomen and pelvis was performed following the standard protocol without IV contrast.  COMPARISON:  None.  FINDINGS: Lower chest: Clear lung bases. Normal heart size. Distal esophageal  dilatation with fluid within the esophagus.  Hepatobiliary: Diminutive size of the liver with a micronodular contour. No focal hepatic mass. Cholelithiasis. No intrahepatic or extrahepatic biliary duct dilatation. Massive ascites.  Pancreas: Normal.  Spleen: Normal.  Adrenals/Urinary Tract: Normal adrenal glands. Normal kidneys. Normal bladder.  Stomach/Bowel: No small bowel dilatation or obstruction. No pneumatosis, pneumoperitoneum or portal venous gas. Multiple small gastric varices.  Vascular/Lymphatic: Normal caliber abdominal aorta. No abdominal or pelvic lymphadenopathy.  Other: No focal fluid collection or hematoma. Generalized body wall edema.  Musculoskeletal: No acute osseous abnormality. No lytic or sclerotic osseous lesion.  IMPRESSION: 1. Cirrhotic liver.  Massive ascites. 2. Cholelithiasis. 3. Distal esophageal dilatation with fluid in the esophagus which may reflect gastroesophageal reflux.   Electronically Signed   By: Elige Ko   On: 03/20/2015 10:23     ASSESSMENT / PLAN:  PULMONARY OETT  Plan intubation for airway protection  during high risk EGD for presumed variceal bleed  P:   Admit ICU  Likely elective intubation prior to EGD   CARDIOVASCULAR Hypotension - mild.  R/t hypovolemia/GI bleeding  Hx dCHF - EF 55-60% P:  Gentle volume  PRBC  Correct coagulopathy as below  F/u lactate  Watch i/o closely   RENAL Acute on CKD  ?hepatorenal  Hyponatremia - mild  P:   F/u chem  Gentle volume  F.u lactate   GASTROINTESTINAL GI bleed  ETOH cirrhosis  End stage liver disease - MELD score=26 Esophageal varices  P:  Protonix, ocreotide gtt  GI following  Planned EGD -- high risk per GI, last scope had difficult to band varices as well as ulcer  Consider TIPS prior to d/c  NPO  Will need paracentesis at some point   HEMATOLOGIC GI bleed  coagulopathy r/t liver disease  Blood loss anemia  P:  FFP x 2  Vit K  F/u CBC q4h  Keep 2 units ahead PRBC  F/u coags     INFECTIOUS Recent SBP  Severe ascites  P:   BCx2 9/17>>> UC 9/17>>>  Empiric ceftriaxone  Culture ascitic fluid when stable enough for paracentesis  Check pct   ENDOCRINE No active issue  P:   Monitor glucose on chem   NEUROLOGIC Hepatic encephalopathy  P:   Start rifaximin/ lactulose when ok with GI for po's   FAMILY  - Updates:  Mother, niece updated at length at bedside 9/17    Dirk Dress, NP 03/20/2015  12:11 PM Pager: [phone] or [phone]    Attending Note:  I have examined patient, reviewed labs, studies and notes. I have discussed the case with Jasper Riling, and I agree with the data and plans as amended above. Acute UGIB likely due to varices. He is hemodynamically stable on my exam, is interacting but is confused. He will need EGD, will need to be intubated for airway protection to accomplish this. We will proceed when he arrives in the ICU. We have discussed with the patient and his family that there is significant risk here that he will not quickly or successfully extubate. They understand and wish to proceed.  Independent critical care time is 60 minutes.   Levy Pupa, MD, PhD 03/20/2015, 2:06 PM [facility] [phone] or if no answer [phone]

## 2015-03-20 NOTE — ED Notes (Signed)
Francisco [name] (mother)  [phone]

## 2015-03-20 NOTE — Progress Notes (Signed)
CRITICAL VALUE ALERT  Critical value received:  Hemoglobin 6.3  Date of notification:  03/20/15  Time of notification:  1740  Critical value read back:Yes.    Nurse who received alert:  Tory Emerald, RN  MD notified (1st page):  [facility] Md Dr Jamison Neighbor  Time of first page:  1740  MD notified (2nd page):  Time of second page:  Responding MD:  Dr Jamison Neighbor  Time MD responded:  [phone]

## 2015-03-20 NOTE — Progress Notes (Signed)
eLink Physician-Brief Progress Note Patient Name: Francisco [name] DOB: [birth year] MRN: [id number]   Date of Service  03/20/2015  HPI/Events of Note  RN notified MD Hgb 6.3 & EGD w/ esophagitis w/o varices. TTE from August w/ normal LV function.  eICU Interventions  Transfuse 2u PRBC & Hgb/Hct post transfusion.     Intervention Category Major Interventions: Hemorrhage - evaluation and management  Lawanda Cousins 03/20/2015, 5:39 PM

## 2015-03-21 ENCOUNTER — Inpatient Hospital Stay (HOSPITAL_COMMUNITY): Payer: Medicaid - Out of State

## 2015-03-21 DIAGNOSIS — K922 Gastrointestinal hemorrhage, unspecified: Secondary | ICD-10-CM

## 2015-03-21 LAB — TYPE AND SCREEN
ABO/RH(D): O POS
ANTIBODY SCREEN: NEGATIVE
UNIT DIVISION: 0
UNIT DIVISION: 0

## 2015-03-21 LAB — BASIC METABOLIC PANEL
ANION GAP: 6 (ref 5–15)
BUN: 6 mg/dL (ref 6–20)
CO2: 23 mmol/L (ref 22–32)
Calcium: 7.6 mg/dL — ABNORMAL LOW (ref 8.9–10.3)
Chloride: 106 mmol/L (ref 101–111)
Creatinine, Ser: 1.13 mg/dL (ref 0.61–1.24)
GFR calc Af Amer: >60 mL/min (ref >60)
Glucose, Bld: 87 mg/dL (ref 65–99)
POTASSIUM: 3.7 mmol/L (ref 3.5–5.1)
SODIUM: 135 mmol/L (ref 135–145)

## 2015-03-21 LAB — PREPARE FRESH FROZEN PLASMA: Unit division: 0

## 2015-03-21 LAB — CBC
HCT: 27.7 % — ABNORMAL LOW (ref 39.0–52.0)
HCT: 25.4 % — ABNORMAL LOW (ref 39.0–52.0)
HEMOGLOBIN: 9.3 g/dL — AB (ref 13.0–17.0)
Hemoglobin: 8.6 g/dL — ABNORMAL LOW (ref 13.0–17.0)
MCH: 27.3 pg (ref 26.0–34.0)
MCH: 27.4 pg (ref 26.0–34.0)
MCHC: 33.6 g/dL (ref 30.0–36.0)
MCHC: 33.9 g/dL (ref 30.0–36.0)
MCV: 81.2 fL (ref 78.0–100.0)
MCV: 80.9 fL (ref 78.0–100.0)
PLATELETS: 66 10*3/uL — AB (ref 150–400)
Platelets: 72 10*3/uL — ABNORMAL LOW (ref 150–400)
RBC: 3.41 MIL/uL — ABNORMAL LOW (ref 4.22–5.81)
RBC: 3.14 MIL/uL — ABNORMAL LOW (ref 4.22–5.81)
RDW: 15.9 % — AB (ref 11.5–15.5)
RDW: 16 % — AB (ref 11.5–15.5)
WBC: 14.1 10*3/uL — ABNORMAL HIGH (ref 4.0–10.5)
WBC: 12.9 10*3/uL — AB (ref 4.0–10.5)

## 2015-03-21 LAB — PROTIME-INR
INR: 2.24 — AB (ref 0.00–1.49)
PROTHROMBIN TIME: 24.6 s — AB (ref 11.6–15.2)

## 2015-03-21 LAB — AMMONIA: Ammonia: 87 umol/L — ABNORMAL HIGH (ref 9–35)

## 2015-03-21 LAB — MAGNESIUM: MAGNESIUM: 1.9 mg/dL (ref 1.7–2.4)

## 2015-03-21 LAB — PROCALCITONIN: Procalcitonin: 0.14 ng/mL

## 2015-03-21 LAB — PHOSPHORUS: PHOSPHORUS: 3 mg/dL (ref 2.5–4.6)

## 2015-03-21 MED ORDER — RIFAXIMIN 200 MG PO TABS
200.0000 mg | ORAL_TABLET | Freq: Three times a day (TID) | ORAL | Status: DC
  Administered 2015-03-21 (×2): 200 mg via ORAL
  Filled 2015-03-21 (×5): qty 1

## 2015-03-21 MED ORDER — SUCRALFATE 1 GM/10ML PO SUSP
1.0000 g | Freq: Three times a day (TID) | ORAL | Status: DC
  Administered 2015-03-21 – 2015-03-26 (×12): 1 g via ORAL
  Filled 2015-03-21 (×17): qty 10

## 2015-03-21 MED ORDER — CETYLPYRIDINIUM CHLORIDE 0.05 % MT LIQD
7.0000 mL | Freq: Two times a day (BID) | OROMUCOSAL | Status: DC
  Administered 2015-03-22 – 2015-03-26 (×4): 7 mL via OROMUCOSAL

## 2015-03-21 MED ORDER — CHLORHEXIDINE GLUCONATE 0.12 % MT SOLN
15.0000 mL | Freq: Two times a day (BID) | OROMUCOSAL | Status: DC
  Administered 2015-03-21 – 2015-03-27 (×10): 15 mL via OROMUCOSAL
  Filled 2015-03-21 (×12): qty 15

## 2015-03-21 MED ORDER — LACTULOSE 10 GM/15ML PO SOLN
30.0000 g | Freq: Three times a day (TID) | ORAL | Status: DC
  Administered 2015-03-21 – 2015-03-26 (×12): 30 g via ORAL
  Filled 2015-03-21 (×17): qty 45

## 2015-03-21 MED ORDER — FENTANYL CITRATE (PF) 100 MCG/2ML IJ SOLN
25.0000 ug | INTRAMUSCULAR | Status: DC | PRN
  Administered 2015-03-21: 50 ug via INTRAVENOUS
  Administered 2015-03-21 (×2): 25 ug via INTRAVENOUS
  Administered 2015-03-22: 50 ug via INTRAVENOUS
  Filled 2015-03-21 (×4): qty 2

## 2015-03-21 MED ORDER — PANTOPRAZOLE SODIUM 40 MG IV SOLR
40.0000 mg | Freq: Two times a day (BID) | INTRAVENOUS | Status: DC
  Administered 2015-03-21 – 2015-03-28 (×15): 40 mg via INTRAVENOUS
  Filled 2015-03-21 (×15): qty 40

## 2015-03-21 NOTE — Progress Notes (Signed)
Updated Elink RN about pt narrow pulse pressure and low urine output. MD will review and place orders, if needed.

## 2015-03-21 NOTE — Progress Notes (Signed)
Progress Note   Subjective  weaning from sedation at present; Patient had not had any further overt melena or GI bleeding overnight. Awaiting labs from this AM   Objective   Vital signs in last 24 hours: Temp:  [94.8 F (34.9 C)-98.7 F (37.1 C)] 96.3 F (35.7 C) (09/18 0845) Pulse Rate:  [107-127] 112 (09/18 0911) Resp:  [11-28] 14 (09/18 0911) BP: (89-116)/(54-84) 94/54 mmHg (09/18 0911) SpO2:  [92 %-100 %] 100 % (09/18 0911) FiO2 (%):  [40 %-100 %] 40 % (09/18 0911) Weight:  [235 lb 10.8 oz (106.9 kg)] 235 lb 10.8 oz (106.9 kg) (09/17 1445) Last BM Date:  (unknown) General:    Black male in NAD. Opening eyes, trying to look around Heart:  tachycardic Lungs: On ventilator Abdomen:  Soft, largely distended. A few bowel bowels. Sides of abdomen flat to percussion. Extremities:  BLE 3+ edema. Neurologic:  Waking up. Weaning from vent.   Lab Results:  Recent Labs  03/20/15 0848 03/20/15 1700 03/21/15 0044  WBC 9.3 7.5 14.1*  HGB 8.3* 6.3* 9.3*  HCT 24.9* 17.8* 27.7*  PLT 114* 77* 72*   BMET  Recent Labs  03/18/15 1046 03/20/15 0837 03/20/15 0848  NA 136 138 133*  K 4.4 4.0 4.0  CL 104 99* 102  CO2 23  --  24  GLUCOSE 109* 106* 110*  BUN 5* 6 <5*  CREATININE 1.18 1.30* 1.40*  CALCIUM 8.3*  --  8.0*   LFT  Recent Labs  03/20/15 0848  PROT 7.6  ALBUMIN 1.4*  AST 64*  ALT 23  ALKPHOS 75  BILITOT 2.4*   PT/INR  Recent Labs  03/20/15 0848  LABPROT 27.2*  INR 2.57*    Studies/Results:  EGD 03/20/15 ENDOSCOPIC IMPRESSION: Severe diffuse distal esophageal ulceration / esophagitis, perhaps in part related to prior history of esophageal banding, although is more extensive than would normally see with banding. No high risk stigmata of bleeding appreciated. No gastric varices Portal hypertensive gastropathy  RECOMMENDATIONS: Continue NPO today given recent bleeding, clears tomorrow as tolerated Continue IV protonix Liquid carafate 5-10cc  PO every 6 hours Can stop octreotide drip Large volume paracentesis, rule out SBP, when patient stable Continue ceftriaxone in light of recent GI bleeding Lactulose and rifaximin when patient starts tolerating PO Consider serologies for HSV, CMV given severe esophageal ulcerations noted GI service will continue to follow   Assessment / Plan:    49 year old male with decompensated ETOH cirrhosis with history of variceal bleeding, ascites, hepatic encephalopathy. Patient admitted with hematemesis / anemia. EGD yesterday revealed severe esophagitis with ulcerations. Ulcerations more extensive than what would be expected after banding. Viral esophagitis on list of possibilities.   Continue PPI but will stop continuous drip and change to BID.    Start Carafate, Xifaxan and lactulose when off vent taking PO.   He is on Rocephin for SBP prevention.   Large volume paracentesis when off vent    LOS: 1 day   Willette Cluster  03/21/2015, 10:18 AM  Attending Addendum: I have taken an interval history, reviewed the chart, and examined the patient. I agree with the Advanced Practitioner's note and impression. 49 y/o male with decompensated alcoholic cirrhosis, with h/o variceal bleeding, hepatic encephalopathy, and ascites / SBP. MELD  24. Not listed for transplant. The patient presented with overt hematemesis and massive ascites. EGD done yesterday showed severe distal diffuse esophageal ulceration - unclear if this is due to prior banding, ischemia, erosive,  or viral. Biopsies not taken given his recent bleeding and friability of the area of concern, with coagulopathy and low platelets. No further evidence of active bleeding since endoscopy. I would continue IV protonix drip at this time and add liquid carafate q 6 hours when tolerating PO, and extubate as he tolerates per primary team. He is in need of large volume paracentesis with cell count and culture to rule out SBP once stable. Agree with empiric  cefriaxone for now given his recent GI bleeding. Recommend lactulose and rifaximin once tolerating liquids  This patient is critically ill at this time with high short term mortality. He is not currently listed for transplant, family reports they plan to re-engage on this process in hopes of listing him in the future. I spent time with the patient and family and critical care team yesterday to discuss his wishes in this setting and how aggressive they wish [place] to be. At this time they wish to have everything done. His varices don't seem to be an issue at this time thus don't think TIPS needs to be pursued, and further he is not a good candidate for it given high MELD and cardiac history. Continue supportive care at this time, we will continue to follow along.   Ileene Patrick, MD [facility] Pager [phone]

## 2015-03-21 NOTE — Progress Notes (Signed)
PULMONARY / CRITICAL CARE MEDICINE   Name: Francisco [name] MRN: [id number] DOB: 1966/05/07    ADMISSION DATE:  03/20/2015  REFERRING MD :  EDP  CHIEF COMPLAINT:  GI bleed, hypotension   INITIAL PRESENTATION: 49 yo male with hx ETOH cirrhosis, varices, chronic renal failure, ascites with recent admit (august) for sepsis r/t SBP requiring elective intubation for endoscopy during that admission.  Was d/c to SNF.  Returns 9/17 with hematemesis and melena x 1 day.  Mild hypotension, anemia and PCCM called to admit.   STUDIES:  EGD 9/17>>>diffuse ulceration / esophagitis in the distal esophagus, without obvious esophageal varices,perhaps in part related to prior history of esophageal banding, although is more extensive than would normally see with banding  SIGNIFICANT EVENTS:   SUBJECTIVE:  No change.  Rested on vent overnight post EGD.   VITAL SIGNS: Temp:  [94.8 F (34.9 C)-98.7 F (37.1 C)] 96.5 F (35.8 C) (09/18 1130) Pulse Rate:  [107-127] 113 (09/18 1130) Resp:  [11-28] 11 (09/18 1130) BP: (89-116)/(49-84) 98/49 mmHg (09/18 1130) SpO2:  [92 %-100 %] 100 % (09/18 1130) FiO2 (%):  [40 %-100 %] 40 % (09/18 1109) Weight:  [235 lb 10.8 oz (106.9 kg)] 235 lb 10.8 oz (106.9 kg) (09/17 1445) HEMODYNAMICS:   VENTILATOR SETTINGS: Vent Mode:  [-] PSV;CPAP FiO2 (%):  [40 %-100 %] 40 % Set Rate:  [14 bmp] 14 bmp Vt Set:  [580 mL] 580 mL PEEP:  [5 cmH20] 5 cmH20 Pressure Support:  [5 cmH20] 5 cmH20 Plateau Pressure:  [17 cmH20-22 cmH20] 21 cmH20 INTAKE / OUTPUT:  Intake/Output Summary (Last 24 hours) at 03/21/15 1146 Last data filed at 03/21/15 1100  Gross per 24 hour  Intake 6169.84 ml  Output    615 ml  Net 5554.84 ml    PHYSICAL EXAMINATION: General:  Chronically ill appearing male, NAD  Neuro: awake, still lingering sedation after propofol off, RASS =0 HEENT:  Mm dry, sclera icteric Cardiovascular:   s1s2 rrr, mild tachy Lungs:  resps even non labored on PS 5/5,  diminished bases  Abdomen:  Grossly distended, ascites, -bs, tympanic, mildly tender diffusely  Musculoskeletal:  Warm and dry, 2+ BLE edema  LABS:  CBC  Recent Labs Lab 03/20/15 0848 03/20/15 1700 03/21/15 0044  WBC 9.3 7.5 14.1*  HGB 8.3* 6.3* 9.3*  HCT 24.9* 17.8* 27.7*  PLT 114* 77* 72*   Coag's  Recent Labs Lab 03/20/15 0848  APTT 48*  INR 2.57*   BMET  Recent Labs Lab 03/18/15 1046 03/20/15 0837 03/20/15 0848  NA 136 138 133*  K 4.4 4.0 4.0  CL 104 99* 102  CO2 23  --  24  BUN 5* 6 <5*  CREATININE 1.18 1.30* 1.40*  GLUCOSE 109* 106* 110*   Electrolytes  Recent Labs Lab 03/18/15 1046 03/20/15 0848  CALCIUM 8.3* 8.0*   Sepsis Markers  Recent Labs Lab 03/20/15 0805 03/20/15 1244 03/20/15 1700  LATICACIDVEN 4.0*  --  2.8*  PROCALCITON  --  <0.10  --    ABG No results for input(s): PHART, PCO2ART, PO2ART in the last 168 hours. Liver Enzymes  Recent Labs Lab 03/18/15 1046 03/20/15 0848  AST 56* 64*  ALT 22 23  ALKPHOS 77 75  BILITOT 2.5* 2.4*  ALBUMIN 1.6* 1.4*   Cardiac Enzymes No results for input(s): TROPONINI, PROBNP in the last 168 hours. Glucose  Recent Labs Lab 03/20/15 1315  GLUCAP 103*    Imaging Dg Chest Port 1 View  03/21/2015  CLINICAL DATA:  Aspiration.  Intubation.  EXAM: PORTABLE CHEST - 1 VIEW  COMPARISON:  03/20/2015  FINDINGS: The endotracheal tube has been repositioned. The tip is 2.1 cm above the carina. The cardiac silhouette, mediastinal and hilar contours are stable. Persistent low lung volumes with vascular crowding and streaky atelectasis. Vascular congestion and possible mild interstitial edema but no pleural effusion.  IMPRESSION: Endotracheal tube in good position, 2.1 cm above the carina.  Persistent low lung volumes with vascular crowding, atelectasis, vascular congestion and possible mild interstitial edema.   Electronically Signed   By: Rudie Meyer M.D.   On: 03/21/2015 08:11   Dg Chest Port 1  View  03/20/2015   CLINICAL DATA:  Endotracheal tube placement  EXAM: PORTABLE CHEST - 1 VIEW  COMPARISON:  03/01/2015  FINDINGS: Endotracheal tube with the tip in the right mainstem bronchus. Recommend retracting the endotracheal tube 3.5 cm.  There are low lung volumes. There is prominence of the central pulmonary vasculature likely secondary to hypoinflation. There is no focal consolidation, pleural effusion or pneumothorax. The heart and mediastinum are stable.  Severe osteoarthritis of the right glenohumeral joint. Moderate osteoarthritis of the left glenohumeral joint.  IMPRESSION: 1. Endotracheal tube with the tip in the right mainstem bronchus. Recommend retracting the endotracheal tube 3.5 cm. These results were called by telephone at the time of interpretation on 03/20/2015 at 3:02 pm to [facility], who verbally acknowledged these results.   Electronically Signed   By: Elige Ko   On: 03/20/2015 15:02     ASSESSMENT / PLAN:  PULMONARY ETT 9/17>>> 9/18 Plan intubation for airway protection during high risk EGD for presumed variceal bleed  P:   SBT now  If tolerates will plan to extubate  pulm hygiene  F/u CXR   CARDIOVASCULAR Hypotension - mild.  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  HEMATOLOGIC UGI bleed  coagulopathy r/t liver disease  Blood loss anemia  P:  S/p FFP x 2 and Vit K  F/u CBC - change to BID with difficult lab stick  Keep 2 units  ahead PRBC  F/u coags    INFECTIOUS Recent SBP  Severe ascites  P:   UC 9/17>>>  Empiric ceftriaxone  Culture ascitic fluid when stable enough for paracentesis  Pct NEG  ENDOCRINE No active issue  P:   Monitor glucose on chem   NEUROLOGIC Hepatic encephalopathy; continues to be confused P:   Start rifaximin/ lactulose when ok with GI for po's Could use lactulose enemas but he is high risk for bleeding from hemorrhoids   FAMILY  - Updates:  sister, niece updated at length at bedside 9/18    Dirk Dress, NP 03/21/2015  11:46 AM Pager: [phone] or [phone]   Attending Note:  I have examined patient, reviewed labs, studies and notes. I have discussed the case with K Whiteheart, and  I agree with the data and plans as amended above. Cirrhosis with GIB, encephalopathy, possible SBP. He tolerated extubation this am. On my eval he is comfortable, awake but confused. Repeats himself and has poor short term memory. No more bleeding seen. Will continue current abx for possible SBP, plan restart lactulose and rifaximin as soon as able, continue carafate. Will likely need paracentesis soon.  Independent critical care time is 45 minutes.   Levy Pupa, MD, PhD 03/21/2015, 2:53 PM  Pulmonary and Critical Care [phone] or if no answer [phone]

## 2015-03-21 NOTE — Procedures (Signed)
Extubation Procedure Note  Patient Details:   Name: Francisco [name] DOB: 01/06/1966 MRN: [id number]   Airway Documentation:     Evaluation  O2 sats: stable throughout Complications: No apparent complications Patient did tolerate procedure well. Bilateral Breath Sounds: Clear Suctioning: Oral, Airway Yes   Patient extubated to 2L nasal cannula per MD order.  Positive cuff leak noted.  No evidence of stridor.  Patient able to speak post extubation.  Sats currently 100%.  Vitals are stable.  No apparent complications.   Durwin Glaze 03/21/2015, 12:43 PM

## 2015-03-21 NOTE — Progress Notes (Signed)
Inpatient [facility] 64M 13 [facility] RN visit. Gip related visit to [facility] DX of Alcoholic cirrhosis of the liver and esophagel varices with bleeding. Pt. Is a Full code status. Patient seen in room post extubation. He was requesting pain medicine for his abdomen and repeatedly asked for ice from his staff RN.  Resps regular and unlabored on a 2L n/c. Pt.awake but did not respond to questions from this RN. [facility] will continue to follow daily. Med list and transfer summary given to RN to place on shadow chart.  Please call with any questions.  Sharen Heck RN [facility] Liaison [phone]

## 2015-03-22 ENCOUNTER — Encounter (HOSPITAL_COMMUNITY): Payer: Self-pay | Admitting: Gastroenterology

## 2015-03-22 ENCOUNTER — Other Ambulatory Visit: Payer: Self-pay | Admitting: Family Medicine

## 2015-03-22 ENCOUNTER — Ambulatory Visit (HOSPITAL_COMMUNITY): Payer: PRIVATE HEALTH INSURANCE

## 2015-03-22 DIAGNOSIS — D689 Coagulation defect, unspecified: Secondary | ICD-10-CM

## 2015-03-22 DIAGNOSIS — Z789 Other specified health status: Secondary | ICD-10-CM

## 2015-03-22 DIAGNOSIS — K2210 Ulcer of esophagus without bleeding: Secondary | ICD-10-CM | POA: Insufficient documentation

## 2015-03-22 DIAGNOSIS — K2961 Other gastritis with bleeding: Secondary | ICD-10-CM

## 2015-03-22 DIAGNOSIS — K766 Portal hypertension: Secondary | ICD-10-CM | POA: Insufficient documentation

## 2015-03-22 DIAGNOSIS — K7210 Chronic hepatic failure without coma: Secondary | ICD-10-CM | POA: Insufficient documentation

## 2015-03-22 DIAGNOSIS — K7290 Hepatic failure, unspecified without coma: Secondary | ICD-10-CM

## 2015-03-22 DIAGNOSIS — K208 Other esophagitis: Secondary | ICD-10-CM

## 2015-03-22 DIAGNOSIS — K7031 Alcoholic cirrhosis of liver with ascites: Secondary | ICD-10-CM | POA: Insufficient documentation

## 2015-03-22 DIAGNOSIS — Z978 Presence of other specified devices: Secondary | ICD-10-CM | POA: Insufficient documentation

## 2015-03-22 LAB — LACTATE DEHYDROGENASE, PLEURAL OR PERITONEAL FLUID: LD, Fluid: 26 U/L — ABNORMAL HIGH (ref 3–23)

## 2015-03-22 LAB — GRAM STAIN

## 2015-03-22 LAB — CBC
HCT: 23.9 % — ABNORMAL LOW (ref 39.0–52.0)
HEMOGLOBIN: 8 g/dL — AB (ref 13.0–17.0)
MCH: 27.1 pg (ref 26.0–34.0)
MCHC: 33.5 g/dL (ref 30.0–36.0)
MCV: 81 fL (ref 78.0–100.0)
Platelets: 65 10*3/uL — ABNORMAL LOW (ref 150–400)
RBC: 2.95 MIL/uL — ABNORMAL LOW (ref 4.22–5.81)
RDW: 16.1 % — AB (ref 11.5–15.5)
WBC: 9.5 10*3/uL (ref 4.0–10.5)

## 2015-03-22 LAB — BASIC METABOLIC PANEL
ANION GAP: 5 (ref 5–15)
ANION GAP: 8 (ref 5–15)
BUN: 7 mg/dL (ref 6–20)
BUN: 7 mg/dL (ref 6–20)
CALCIUM: 7.2 mg/dL — AB (ref 8.9–10.3)
CALCIUM: 7.7 mg/dL — AB (ref 8.9–10.3)
CHLORIDE: 107 mmol/L (ref 101–111)
CO2: 22 mmol/L (ref 22–32)
CO2: 20 mmol/L — AB (ref 22–32)
CREATININE: 1.04 mg/dL (ref 0.61–1.24)
Chloride: 108 mmol/L (ref 101–111)
Creatinine, Ser: 1.12 mg/dL (ref 0.61–1.24)
GFR calc Af Amer: >60 mL/min (ref >60)
GFR calc Af Amer: >60 mL/min (ref >60)
GFR calc non Af Amer: >60 mL/min (ref >60)
GFR calc non Af Amer: >60 mL/min (ref >60)
GLUCOSE: 87 mg/dL (ref 65–99)
GLUCOSE: 84 mg/dL (ref 65–99)
POTASSIUM: 3.2 mmol/L — AB (ref 3.5–5.1)
Potassium: 3.9 mmol/L (ref 3.5–5.1)
Sodium: 134 mmol/L — ABNORMAL LOW (ref 135–145)
Sodium: 136 mmol/L (ref 135–145)

## 2015-03-22 LAB — BODY FLUID CELL COUNT WITH DIFFERENTIAL
Eos, Fluid: 0 %
Lymphs, Fluid: 32 %
Monocyte-Macrophage-Serous Fluid: 23 % — ABNORMAL LOW (ref 50–90)
Neutrophil Count, Fluid: 45 % — ABNORMAL HIGH (ref 0–25)
WBC FLUID: 19 uL (ref 0–1000)

## 2015-03-22 LAB — PROCALCITONIN: Procalcitonin: 0.11 ng/mL

## 2015-03-22 LAB — PROTIME-INR
INR: 2.24 — AB (ref 0.00–1.49)
Prothrombin Time: 24.5 seconds — ABNORMAL HIGH (ref 11.6–15.2)

## 2015-03-22 LAB — PROTEIN, BODY FLUID: Total protein, fluid: <3 g/dL

## 2015-03-22 MED ORDER — RIFAXIMIN 550 MG PO TABS
550.0000 mg | ORAL_TABLET | Freq: Two times a day (BID) | ORAL | Status: DC
  Administered 2015-03-22 – 2015-03-28 (×9): 550 mg via ORAL
  Filled 2015-03-22 (×12): qty 1

## 2015-03-22 MED ORDER — FENTANYL CITRATE (PF) 100 MCG/2ML IJ SOLN
25.0000 ug | INTRAMUSCULAR | Status: DC | PRN
  Administered 2015-03-22 – 2015-03-24 (×3): 25 ug via INTRAVENOUS
  Filled 2015-03-22 (×3): qty 2

## 2015-03-22 MED ORDER — POTASSIUM CHLORIDE 10 MEQ/100ML IV SOLN
10.0000 meq | INTRAVENOUS | Status: AC
  Administered 2015-03-22 (×2): 10 meq via INTRAVENOUS
  Filled 2015-03-22 (×2): qty 100

## 2015-03-22 MED ORDER — ALBUMIN HUMAN 25 % IV SOLN
12.5000 g | Freq: Once | INTRAVENOUS | Status: AC
  Administered 2015-03-22: 12.5 g via INTRAVENOUS
  Filled 2015-03-22: qty 50

## 2015-03-22 MED ORDER — VITAMIN K1 10 MG/ML IJ SOLN
10.0000 mg | Freq: Once | INTRAVENOUS | Status: AC
  Administered 2015-03-22: 10 mg via INTRAVENOUS
  Filled 2015-03-22: qty 1

## 2015-03-22 MED ORDER — POTASSIUM CHLORIDE CRYS ER 20 MEQ PO TBCR
40.0000 meq | EXTENDED_RELEASE_TABLET | ORAL | Status: DC
  Filled 2015-03-22: qty 2

## 2015-03-22 NOTE — Procedures (Signed)
[place] abdo 1 massive ascites diffuse, no loculation 2. Bowel furthest on pt rt  Mcarthur Rossetti. Tyson Alias, MD, FACP Pgr: [phone] [place] [facility]   Para with [place] gudiance  chrlorprep rt murphy US guidance 10 cc 1% lido Needle placed, clear yell;ow fluid Drained 5.7 liters Could get more but tachy and avoid drop BP Happy to para pre dc again Tolerated well Blood loss less 1 cc Wife present All done sterile fashion , full  Drape  Mcarthur Rossetti. Tyson Alias, MD, FACP Pgr: [phone] [place] [facility]

## 2015-03-22 NOTE — Progress Notes (Addendum)
PULMONARY / CRITICAL CARE MEDICINE   Name: Peng Thorstenson MRN: [id number] DOB: January 09, 1966    ADMISSION DATE:  03/20/2015  REFERRING MD :  EDP  CHIEF COMPLAINT:  GI bleed, hypotension   INITIAL PRESENTATION: 49 yo male with hx ETOH cirrhosis, varices, chronic renal failure, ascites with recent admit (august) for sepsis r/t SBP requiring elective intubation for endoscopy during that admission.  Was d/c to SNF.  Returns 9/17 with hematemesis and melena x 1 day.  Mild hypotension, anemia and PCCM called to admit.   STUDIES:  EGD 9/17>>>diffuse ulceration / esophagitis in the distal esophagus, without obvious esophageal varices,perhaps in part related to prior history of esophageal banding, although is more extensive than would normally see with banding  SIGNIFICANT EVENTS:  Off vent No distress   VITAL SIGNS: Temp:  [96.2 F (35.7 C)-97.1 F (36.2 C)] 97 F (36.1 C) (09/19 0130) Pulse Rate:  [105-126] 126 (09/19 0630) Resp:  [10-28] 23 (09/19 0630) BP: (84-112)/(48-86) 112/86 mmHg (09/19 0630) SpO2:  [96 %-100 %] 97 % (09/19 0630) FiO2 (%):  [40 %] 40 % (09/18 1200) Weight:  [110 kg (242 lb 8.1 oz)] 110 kg (242 lb 8.1 oz) (09/19 0500) HEMODYNAMICS:   VENTILATOR SETTINGS: Vent Mode:  [-] PSV;CPAP FiO2 (%):  [40 %] 40 % Set Rate:  [14 bmp] 14 bmp Vt Set:  [580 mL] 580 mL PEEP:  [5 cmH20] 5 cmH20 Pressure Support:  [5 cmH20] 5 cmH20 Plateau Pressure:  [21 cmH20] 21 cmH20 INTAKE / OUTPUT:  Intake/Output Summary (Last 24 hours) at 03/22/15 0740 Last data filed at 03/22/15 0600  Gross per 24 hour  Intake 2762.8 ml  Output    645 ml  Net 2117.8 ml    PHYSICAL EXAMINATION: General:  Chronically ill appearing male, NAD  Neuro: awake, calm, mild int confusion HEENT:  Mm dry, sclera icteric Cardiovascular:   s1s2 rrr, mild tachy 115 Lungs:  CTA Abdomen:  Grossly distended, ascites severe , fluid wave, -bs, tympanic, mildly tender diffusely  Musculoskeletal:  Warm and dry,  2+ BLE edema  LABS:  CBC  Recent Labs Lab 03/21/15 0044 03/21/15 1247 03/21/15 2345  WBC 14.1* 12.9* 9.5  HGB 9.3* 8.6* 8.0*  HCT 27.7* 25.4* 23.9*  PLT 72* 66* 65*   Coag's  Recent Labs Lab 03/20/15 0848 03/21/15 1217 03/22/15 0500  APTT 48*  --   --   INR 2.57* 2.24* 2.24*   BMET  Recent Labs Lab 03/20/15 0848 03/21/15 1217 03/22/15 0500  NA 133* 135 134*  K 4.0 3.7 3.2*  CL 102 106 107  CO2 BUN <5* 6 7  CREATININE 1.40* 1.13 1.12  GLUCOSE 110* 87 87   Electrolytes  Recent Labs Lab 03/20/15 0848 03/21/15 1217 03/22/15 0500  CALCIUM 8.0* 7.6* 7.2*  MG  --  1.9  --   PHOS  --  3.0  --    Sepsis Markers  Recent Labs Lab 03/20/15 0805 03/20/15 1244 03/20/15 1700 03/21/15 1217 03/22/15 0500  LATICACIDVEN 4.0*  --  2.8*  --   --   PROCALCITON  --  <0.10  --  0.14 0.11   ABG No results for input(s): PHART, PCO2ART, PO2ART in the last 168 hours. Liver Enzymes  Recent Labs Lab 03/18/15 1046 03/20/15 0848  AST 56* 64*  ALT 22 23  ALKPHOS 77 75  BILITOT 2.5* 2.4*  ALBUMIN 1.6* 1.4*   Cardiac Enzymes No results for input(s): TROPONINI, PROBNP in the last  168 hours. Glucose  Recent Labs Lab 03/20/15 1315  GLUCAP 103*    Imaging No results found.   ASSESSMENT / PLAN:  PULMONARY ETT 9/17>>> 9/18 Plan intubation for airway protection during high risk EGD for presumed variceal bleed  P:   IS Upright May need para, will perfrom  CARDIOVASCULAR Hypotension - mild.  R/t hypovolemia/GI bleeding  Hx dCHF - EF 55-60% P:  Gentle volume - kvo, will dilute  Cbc in am   RENAL Acute on CKD Hyponatremia - mild  hpok P:   F/u chem  Gentle volume -dc k supp Lactate cleared, no repeat needed  GASTROINTESTINAL GI bleed  ETOH cirrhosis  Recent SBP End stage liver disease - MELD score=26 Esophageal varices  P:  Cont PPI - change to BID per GI  GI following  Consider TIPS prior to d/c  NPO  Will need  paracentesis at some point  Carafate, Xifaxan and lactulose  NO role repeat ammonia, treat clinically Stool noted Diet increase  HEMATOLOGIC UGI bleed  coagulopathy r/t liver disease  Blood loss anemia  P:  Last coags reviewed, repeat vit K  F/u cbc in am , no clinical bleeding noted F/u coags in am   INFECTIOUS Recent SBP  Severe ascites  P:   UC 9/17>>>  Empiric ceftriaxone, consider dc as no varicies as source I will para him today or in am , pending renal fxn and BP soft, BP re assessment and coag correction further Pct NEG  ENDOCRINE No active issue  P:   Monitor glucose on chem   NEUROLOGIC Hepatic encephalopathy; continues to be confused, improved P:   Maintain rifaximin/ lactulose , may need to q6h Avoid benzo \\LF  tin am    FAMILY  - Updates:  sister, niece updated at length at bedside 9/18  To tele, triad  Mcarthur Rossetti. Tyson Alias, MD, FACP Pgr: [phone] [place] [facility]

## 2015-03-22 NOTE — Progress Notes (Signed)
Inpatient [facility] 37M 13 [facility] RN visit.   This is a  related admission to [facility] DX of alcoholic cirrhosis of the liver and esophagel varices with bleeding. Patient  is a Full Code. Patient seen in room laying in bed. He was just transferred from MICU.  He verbalized his pain is better controlled today. His oxygen saturation is 100% on RA. No family present at time of visit.  Patient currently receiving 1 unit of plasma. 6 doses of Fentanyl given IV the past 24 hours, per chart review.  [facility] will continue to follow daily.  Per [facility] social worker, plan is to discharge home with hospice once medically appropriate.  Please call with any questions.  Chelsea Aus RN, BSN [facility] Liaison [phone]

## 2015-03-22 NOTE — Progress Notes (Signed)
[facility] Progress Note  Subjective:  49 yo male with decompensated alcoholic cirrhosis and hx hep C, varices, CRF,and ascites. S/P admission in Aug for SBP 8/6 through 8/26. Cultures grew bacillus from peritoneal cultures on 02/08/15.  (recently discharged from a SNF after a prolonged hospitalization at an OSH, was admitted on 02/06/15 with acute GI bleeding, hypotension/shock, and hepatic encephalopathy requiring ventilator support and ICU care from 02/06/15-02/22/15 under the care of the critical care team.)Had UGIB: - EGD done 02/06/15, no varices but small peptic ulcers seen.  - Maintained on a PPI while in the hospital. Pt was schedules for outpt paracentesis this morning, but family brought him to ED 9/17 after experiencing multiple episodes of hematemesis. Pt hadsuspected variceal bleed, also suspected SBP, and hepatic encephalopathy.  EGD 03/20/15:Severe diffuse distal esophageal ulceration / esophagitis, perhaps in part related to prior history of esophageal banding, although is more extensive than would normally see with banding. No high risk stigmata of bleeding appreciated. No gastric varices Portal hypertensive gastropathy  Had paracentesis with drainage of 5.7 liters earlier today.Says he has less abd pain since paracentesis. Objective:  Vital signs in last 24 hours: Temp:  [96.6 F (35.9 C)-99.1 F (37.3 C)] 97.1 F (36.2 C) (09/19 1415) Pulse Rate:  [105-126] 117 (09/19 1415) Resp:  [10-26] 13 (09/19 1415) BP: (84-112)/(48-86) 86/51 mmHg (09/19 1415) SpO2:  [92 %-100 %] 96 % (09/19 1415) Weight:  [242 lb 8.1 oz (110 kg)] 242 lb 8.1 oz (110 kg) (09/19 0500) Last BM Date: 03/22/15 General:   Alert, chronically ill appearing male, mildly confused Heart:  Regular rate and rhythm; no murmurs Pulm;lungs clear Abdomen:  Soft, mild diffuse tenderness, distended, diminished bowel sounds   Extremities:  Without edema. Neurologic:  Alert , mildly confused Psych:   Alert and cooperative  Intake/Output from previous day: 09/18 0701 - 09/19 0700 In: 2862.8 [P.O.:275; I.V.:2537.8; IV Piggyback:50] Out: 645 [Urine:645] Intake/Output this shift: Total I/O In: 245 [I.V.:195; IV Piggyback:50] Out: 90 [Urine:90]  Lab Results:  Recent Labs  03/21/15 0044 03/21/15 1247 03/21/15 2345  WBC 14.1* 12.9* 9.5  HGB 9.3* 8.6* 8.0*  HCT 27.7* 25.4* 23.9*  PLT 72* 66* 65*   BMET  Recent Labs  03/20/15 0848 03/21/15 1217 03/22/15 0500  NA 133* 135 134*  K 4.0 3.7 3.2*  CL 102 106 107  CO2 GLUCOSE 110* 87 87  BUN <5* 6 7  CREATININE 1.40* 1.13 1.12  CALCIUM 8.0* 7.6* 7.2*   LFT  Recent Labs  03/20/15 0848  PROT 7.6  ALBUMIN 1.4*  AST 64*  ALT 23  ALKPHOS 75  BILITOT 2.4*   PT/INR  Recent Labs  03/21/15 1217 03/22/15 0500  LABPROT 24.6* 24.5*  INR 2.24* 2.24*     Dg Chest Port 1 View  03/21/2015   CLINICAL DATA:  Aspiration.  Intubation.  EXAM: PORTABLE CHEST - 1 VIEW  COMPARISON:  03/20/2015  FINDINGS: The endotracheal tube has been repositioned. The tip is 2.1 cm above the carina. The cardiac silhouette, mediastinal and hilar contours are stable. Persistent low lung volumes with vascular crowding and streaky atelectasis. Vascular congestion and possible mild interstitial edema but no pleural effusion.  IMPRESSION: Endotracheal tube in good position, 2.1 cm above the carina.  Persistent low lung volumes with vascular crowding, atelectasis, vascular congestion and possible mild interstitial edema.   Electronically Signed   By: Rudie Meyer M.D.   On: 03/21/2015 08:11   Dg  Chest Port 1 View  03/20/2015   CLINICAL DATA:  Endotracheal tube placement  EXAM: PORTABLE CHEST - 1 VIEW  COMPARISON:  03/01/2015  FINDINGS: Endotracheal tube with the tip in the right mainstem bronchus. Recommend retracting the endotracheal tube 3.5 cm.  There are low lung volumes. There is prominence of the central pulmonary vasculature likely  secondary to hypoinflation. There is no focal consolidation, pleural effusion or pneumothorax. The heart and mediastinum are stable.  Severe osteoarthritis of the right glenohumeral joint. Moderate osteoarthritis of the left glenohumeral joint.  IMPRESSION: 1. Endotracheal tube with the tip in the right mainstem bronchus. Recommend retracting the endotracheal tube 3.5 cm. These results were called by telephone at the time of interpretation on 03/20/2015 at 3:02 pm to [facility], who verbally acknowledged these results.   Electronically Signed   By: Elige Ko   On: 03/20/2015 15:02    ASSESSMENT/PLAN:  49 yo male adm with GI bleed, ETOH cirrhosis, recent SBP, esophageal varices, MELD score 26. Cont PPI, carafate, xifaxan and lactulose.INR 2.24, rec Vit K. Had paracentesis earlier today. Culture pending. His varices don't seem to be an issue at this time thus don't think TIPS needs to be pursued, and further he is not a good candidate for it given high MELD and cardiac history. Continue supportive care.     LOS: 2 days   Hvozdovic, Tollie Pizza PA-C 03/22/2015, Pager [phone]

## 2015-03-22 NOTE — Clinical Documentation Improvement (Signed)
Critical Care  Can the diagnosis of CKD be further specified? Please document findings in next progress note and include in discharge summary if applicable.   CKD Stage I - GFR greater than or equal to 90  CKD Stage II - GFR 60-89  CKD Stage III - GFR 30-59  CKD Stage IV - GFR 15-29  CKD Stage V - GFR < 15  ESRD (End Stage Renal Disease)  Other condition  Unable to clinically determine  Supporting Information: : (risk factors, signs and symptoms, diagnostics, treatment)  Black male  GFR's for current admission are > 60  Please exercise your independent, professional judgment when responding. A specific answer is not anticipated or expected.  Thank You, Shellee Milo BSN, RN Health Information Management [place] [phone]

## 2015-03-22 NOTE — Progress Notes (Signed)
Home Care Social Worker (SW) made visit after learning Patient was in the hospital.  He woke easily when spoken to and had a hard time talking above a whisper.  SW could hear him after leaving room asking for help.  When SW and RN asked him what he wanted and he stated he was uncomfortable and just wanted help to feel better.  RN stated she is going to talk to the doctor about having the fluid drawn off today instead of waiting until tomorrow.  Patient stated he was uncomfortable and his abdomen feels tighter than it has.  Plan is for Patient to return home at time of discharge and SW will continue to follow and assist with discharge planning as needed.    Maretta Los, LCSW [facility] [phone]

## 2015-03-22 NOTE — Clinical Documentation Improvement (Signed)
Critical Care  Based on the clinical findings below, please further specify the acuity of "blood loss anemia". Please document findings in next progress note and include in discharge summary if applicable.   Acute blood loss anemia  Chronic blood loss anemia  Acute on chronic blood loss anemia  Other  Clinically Undetermined  Supporting Information:  H&H was 6.3 and 17.8  Transfused 2 units of PRBC's  Repeat draw = 9.3 / 27.7  Please exercise your independent, professional judgment when responding. A specific answer is not anticipated or expected.  Thank You, Shellee Milo BSN, RN Health Information Management [place] [phone]

## 2015-03-22 NOTE — Progress Notes (Signed)
Per day RN: pt did not receive 3rd run of K as order expired prior to administration. FFP was connected to hand IV; however day RN walked in to find that patient had pulled IV out of hand and FFP was going into bed- RN unsure of how much leaked out.   Elink notified. And Dr. Dellie Catholic orders received. Pt alert, but will not or cannot (RN is unable to assess) answer questions appropriately. VSS.   Will continue to monitor.   Francisco [name]

## 2015-03-22 NOTE — Progress Notes (Signed)
eLink Physician-Brief Progress Note Patient Name: Francisco [name] DOB: February 12, 1966 MRN: [id number]   Date of Service  03/22/2015  HPI/Events of Note  Multiple issues. 1. K+ = 3.2 at 5 AM and was partially replaced and 2. FFP ran into bed.   eICU Interventions  Will order: 1. BMP and PT-INR now.      Intervention Category Minor Interventions: Clinical assessment - ordering diagnostic tests  Lenell Antu 03/22/2015, 8:27 PM

## 2015-03-23 DIAGNOSIS — R109 Unspecified abdominal pain: Secondary | ICD-10-CM

## 2015-03-23 DIAGNOSIS — R1110 Vomiting, unspecified: Secondary | ICD-10-CM

## 2015-03-23 DIAGNOSIS — K567 Ileus, unspecified: Secondary | ICD-10-CM

## 2015-03-23 LAB — CBC WITH DIFFERENTIAL/PLATELET
Basophils Absolute: 0 10*3/uL (ref 0.0–0.1)
Basophils Relative: 1 %
EOS ABS: 0.4 10*3/uL (ref 0.0–0.7)
EOS PCT: 5 %
HCT: 24.9 % — ABNORMAL LOW (ref 39.0–52.0)
Hemoglobin: 8.2 g/dL — ABNORMAL LOW (ref 13.0–17.0)
LYMPHS ABS: 2.6 10*3/uL (ref 0.7–4.0)
LYMPHS PCT: 37 %
MCH: 27.2 pg (ref 26.0–34.0)
MCHC: 32.9 g/dL (ref 30.0–36.0)
MCV: 82.5 fL (ref 78.0–100.0)
MONO ABS: 1 10*3/uL (ref 0.1–1.0)
MONOS PCT: 14 %
Neutro Abs: 3 10*3/uL (ref 1.7–7.7)
Neutrophils Relative %: 43 %
PLATELETS: 48 10*3/uL — AB (ref 150–400)
RBC: 3.02 MIL/uL — AB (ref 4.22–5.81)
RDW: 16.5 % — AB (ref 11.5–15.5)
WBC: 7.1 10*3/uL (ref 4.0–10.5)

## 2015-03-23 LAB — PREPARE FRESH FROZEN PLASMA
UNIT DIVISION: 0
Unit division: 0

## 2015-03-23 LAB — COMPREHENSIVE METABOLIC PANEL
ALBUMIN: 1.7 g/dL — AB (ref 3.5–5.0)
ALT: 19 U/L (ref 17–63)
AST: 43 U/L — AB (ref 15–41)
Alkaline Phosphatase: 58 U/L (ref 38–126)
Anion gap: 5 (ref 5–15)
BILIRUBIN TOTAL: 2 mg/dL — AB (ref 0.3–1.2)
BUN: 6 mg/dL (ref 6–20)
CHLORIDE: 110 mmol/L (ref 101–111)
CO2: 23 mmol/L (ref 22–32)
Calcium: 8 mg/dL — ABNORMAL LOW (ref 8.9–10.3)
Creatinine, Ser: 0.99 mg/dL (ref 0.61–1.24)
GFR calc Af Amer: >60 mL/min (ref >60)
GLUCOSE: 85 mg/dL (ref 65–99)
Potassium: 3.6 mmol/L (ref 3.5–5.1)
Sodium: 138 mmol/L (ref 135–145)
Total Protein: 6.1 g/dL — ABNORMAL LOW (ref 6.5–8.1)

## 2015-03-23 LAB — PROTIME-INR
INR: 1.91 — ABNORMAL HIGH (ref 0.00–1.49)
INR: 1.99 — ABNORMAL HIGH (ref 0.00–1.49)
Prothrombin Time: 21.8 seconds — ABNORMAL HIGH (ref 11.6–15.2)
Prothrombin Time: 22.5 seconds — ABNORMAL HIGH (ref 11.6–15.2)

## 2015-03-23 LAB — APTT: APTT: 47 s — AB (ref 24–37)

## 2015-03-23 NOTE — Progress Notes (Signed)
Patient has refused medications this shift.  He has also been lethargic.  When aroused, he asked to see his mother.  Patient's mother, Debarah Crape, was called and stated that she will come to see the patient this evening.

## 2015-03-23 NOTE — Progress Notes (Signed)
Notified Dr. Marchelle Gearing concerning am APTT and Platelet count. No new orders given.   Will continue to monitor.   Edgardo Roys

## 2015-03-23 NOTE — Progress Notes (Signed)
UR Completed. Samantha Claxton, RN, BSN.  [phone] 

## 2015-03-23 NOTE — Progress Notes (Signed)
[facility] PROGRESS NOTE  Francisco [name] ZOX:[id number] DOB: 1966-03-05 DOA: 03/20/2015 PCP: Pcp Not In System  Brief narrative: 49-year-old who presented with decompensated alcoholic cirrhosis and history of hep C, varices, ascites, and CRF. Status post admission in August 8/6 through 8/26 for SBP. Presented to the hospital secondary to hematemesis.  Assessment/Plan: Active Problems:   Upper GI bleed - GI on board and currently managing. Patient is status post EGD with no evidence of variceal bleeding. EGD showed severe ulcerative esophagitis.    Alcoholic cirrhosis of liver with ascites - Patient is status post paracentesis and cultures pending    Erosive esophagitis - Agree with continuing PPI every 12 hours    Portal hypertension/End stage liver disease -Management per GI  Code Status: Full Family Communication: No family at bedside Disposition Plan: Pending work up and culture results   Consultants:  GI  Procedures:  Paracentesis and EGD  Antibiotics:  rifaximin  HPI/Subjective: Patient has no new complaints. No acute issues reported overnight  Objective: Filed Vitals:   03/23/15 1448  BP: 87/58  Pulse: 117  Temp: 98 F (36.7 C)  Resp: 19    Intake/Output Summary (Last 24 hours) at 03/23/15 1640 Last data filed at 03/23/15 1448  Gross per 24 hour  Intake    672 ml  Output    500 ml  Net    172 ml   Filed Weights   03/20/15 1445 03/22/15 0500 03/23/15 0431  Weight: 106.9 kg (235 lb 10.8 oz) 110 kg (242 lb 8.1 oz) 106 kg (233 lb 11 oz)    Exam:   General:  Patient in no acute distress, alert and awake  Cardiovascular: Regular rate and rhythm, no rubs  Respiratory: No increased work of breathing, no wheezes  Abdomen: Distended, positive bowel sounds, nontender  Musculoskeletal: No cyanosis or clubbing   Data Reviewed: Basic Metabolic Panel:  Recent Labs Lab 03/20/15 0848 03/21/15 1217 03/22/15 0500 03/22/15 2204 03/23/15 0407   NA 133* 135 134* 136 138  K 4.0 3.7 3.2* 3.9 3.6  CL 102 106 107 108 110  CO2 20* 23  GLUCOSE 110* 87 87 84 85  BUN <5* CREATININE 1.40* 1.13 1.12 1.04 0.99  CALCIUM 8.0* 7.6* 7.2* 7.7* 8.0*  MG  --  1.9  --   --   --   PHOS  --  3.0  --   --   --    Liver Function Tests:  Recent Labs Lab 03/18/15 1046 03/20/15 0848 03/23/15 0407  AST 56* 64* 43*  ALT ALKPHOS 77 75 58  BILITOT 2.5* 2.4* 2.0*  PROT 7.7 7.6 6.1*  ALBUMIN 1.6* 1.4* 1.7*    Recent Labs Lab 03/20/15 0848  LIPASE 25    Recent Labs Lab 03/20/15 0805 03/21/15 1218  AMMONIA 48* 87*   CBC:  Recent Labs Lab 03/18/15 1103  03/20/15 0848 03/20/15 1700 03/21/15 0044 03/21/15 1247 03/21/15 2345 03/23/15 0407  WBC 7.3  --  9.3 7.5 14.1* 12.9* 9.5 7.1  NEUTROABS 3.4  --  5.8  --   --   --   --  3.0  HGB 9.1*  < > 8.3* 6.3* 9.3* 8.6* 8.0* 8.2*  HCT 27.1*  < > 24.9* 17.8* 27.7* 25.4* 23.9* 24.9*  MCV 80.4  --  80.6 80.9 81.2 80.9 81.0 82.5  PLT 123*  --  114* 77* 72* 66* 65* 48*  < > =  values in this interval not displayed. Cardiac Enzymes: No results for input(s): CKTOTAL, CKMB, CKMBINDEX, TROPONINI in the last 168 hours. BNP (last 3 results)  Recent Labs  02/06/15 1138  BNP 25.4    ProBNP (last 3 results) No results for input(s): PROBNP in the last 8760 hours.  CBG:  Recent Labs Lab 03/20/15 1315  GLUCAP 103*    Recent Results (from the past 240 hour(s))  MRSA PCR Screening     Status: None   Collection Time: 03/20/15  1:29 PM  Result Value Ref Range Status   MRSA by PCR NEGATIVE NEGATIVE Final    Comment:        The GeneXpert MRSA Assay (FDA approved for NASAL specimens only), is one component of a comprehensive MRSA colonization surveillance program. It is not intended to diagnose MRSA infection nor to guide or monitor treatment for MRSA infections.   Culture, body fluid-bottle     Status: None (Preliminary result)   Collection Time: 03/22/15  12:12 PM  Result Value Ref Range Status   Specimen Description FLUID PERITONEAL CAVITY  Final   Special Requests AEB  Final   Culture NO GROWTH 1 DAY  Final   Report Status PENDING  Incomplete  Gram stain     Status: None   Collection Time: 03/22/15 12:12 PM  Result Value Ref Range Status   Specimen Description FLUID PERITONEAL CAVITY  Final   Special Requests NONE  Final   Gram Stain   Final    FEW WBC PRESENT, PREDOMINANTLY MONONUCLEAR NO ORGANISMS SEEN    Report Status 03/22/2015 FINAL  Final     Studies: No results found.  Scheduled Meds: . sodium chloride   Intravenous Once  . antiseptic oral rinse  7 mL Mouth Rinse q12n4p  . antiseptic oral rinse  7 mL Mouth Rinse QID  . chlorhexidine  15 mL Mouth Rinse BID  . lactulose  30 g Oral TID  . pantoprazole (PROTONIX) IV  40 mg Intravenous Q12H  . rifaximin  550 mg Oral BID  . sucralfate  1 g Oral TID WC & HS   Continuous Infusions: . sodium chloride 10 mL/hr at 03/22/15 0830    Time spent: 10 minutes    Penny Pia  [facility] Pager [phone] If 7PM-7AM, please contact night-coverage at [email or web address], password [facility] 03/23/2015, 4:40 PM  LOS: 3 days

## 2015-03-23 NOTE — Progress Notes (Signed)
     [facility] Progress Note  Subjective:    49 yo male with decompensated alcoholic cirrhosis and hx hep C, varices, CRF,and ascites. S/P admission in Aug for SBP 8/6 through 8/26. Cultures grew bacillus from peritoneal cultures on 02/08/15.  (recently discharged from a SNF after a prolonged hospitalization at an OSH, was admitted on 02/06/15 with acute GI bleeding, hypotension/shock, and hepatic encephalopathy requiring ventilator support and ICU care from 02/06/15-02/22/15 under the care of the critical care team.)Had UGIB: - EGD done 02/06/15, no varices but small peptic ulcers seen.  - Maintained on a PPI while in the hospital. Pt was schedules for outpt paracentesis this morning, but family brought him to ED 9/17 after experiencing multiple episodes of hematemesis. Pt hadsuspected variceal bleed, also suspected SBP, and hepatic encephalopathy.  EGD 03/20/15:Severe diffuse distal esophageal ulceration / esophagitis, perhaps in part related to prior history of esophageal banding, although is more extensive than would normally see with banding. No high risk stigmata of bleeding appreciated. No gastric varices Portal hypertensive gastropathy Paracentesis yesterday with no evidence SBP., GS no organisms seen,culture pending.Not on rocephin. Currently on lactulose and xifaxan for HE.   Objective:  Vital signs in last 24 hours: Temp:  [96.9 F (36.1 C)-99.1 F (37.3 C)] 98.5 F (36.9 C) (09/20 0431) Pulse Rate:  [74-123] 117 (09/20 0431) Resp:  [10-22] 18 (09/20 0431) BP: (86-106)/(51-72) 91/61 mmHg (09/20 0431) SpO2:  [92 %-100 %] 100 % (09/20 0431) Weight:  [233 lb 11 oz (106 kg)] 233 lb 11 oz (106 kg) (09/20 0431) Last BM Date: 03/22/15 General: Alert, chronically ill appearing male, mildly confused Heart: Regular rate and rhythm; no murmurs Pulm;lungs clear Abdomen: Soft, mild diffuse tenderness, distended, diminished bowel sounds  Extremities: Without  edema. Neurologic: Alert , mildly confused Psych: Alert and cooperative  Intake/Output from previous day: 09/19 0701 - 09/20 0700 In: 703.3 [I.V.:215; Blood:338.3; IV Piggyback:150] Out: 490 [Urine:490] Intake/Output this shift: Total I/O In: 472 [P.O.:472] Out: 200 [Urine:200]  Lab Results:  Recent Labs  03/21/15 1247 03/21/15 2345 03/23/15 0407  WBC 12.9* 9.5 7.1  HGB 8.6* 8.0* 8.2*  HCT 25.4* 23.9* 24.9*  PLT 66* 65* 48*   BMET  Recent Labs  03/22/15 0500 03/22/15 2204 03/23/15 0407  NA 134* 136 138  K 3.2* 3.9 3.6  CL 107 108 110  CO2 22 20* 23  GLUCOSE 87 84 85  BUN CREATININE 1.12 1.04 1.61  CALCIUM 7.2* 7.7* 8.0*   LFT  Recent Labs  03/23/15 0407  PROT 6.1*  ALBUMIN 1.7*  AST 43*  ALT 19  ALKPHOS 58  BILITOT 2.0*   PT/INR  Recent Labs  03/22/15 2354 03/23/15 0407  LABPROT 21.8* 22.5*  INR 1.91* 1.99*     ASSESSMENT/PLAN:   49 yo male adm with GI bleed, ETOH cirrhosis, recent SBP, esophageal varices.EGD recently showed severe ulcerative esophagitis. No evidence for variceal bleeding.Esophagitis likely secondary to reflux.  Had paracentesis yesterday--cultures pending. (given recent UGIB/portal HTN/cirrhosis ? Rocephin). Hospice has seen pt and plan is to d/c home with hospice when medically appropriate.Continue supportive care.( Pt was felt to not be candidate for TIPS given high MELD and cardiac history.)     LOS: 3 days   Hvozdovic, Tollie Pizza PA-C 03/23/2015, Pager [phone]

## 2015-03-23 NOTE — Progress Notes (Signed)
Pt drowsy and dull. Pt taking only ice cream for breakfast. Requested Sprite which burned his esophagus. His wife, mother and daughter were here and state that pt is set up with Hospice. The mother has applied for Medicaid of [place] (Pt is from [place]). Mother and sister plan on taking pt home. Wife is returning to [place] for work. Pt wants to go home.

## 2015-03-23 NOTE — Progress Notes (Signed)
Inpatient [facility] 18M 13 [facility] RN visit.   This is a related admission to [facility] DX of alcoholic cirrhosis of the liver and esophagel varices with bleeding. Patient is a Full Code.  Patient lying in bed sleeping. Staff RN Angelica reports pt. has been uncomfortable despite having 5.7 liters of fluid removed by paracentesis yesterday. He has not required any Fentanyl IV since yesterday.  [facility] will continue to follow daily.  Per [facility] social worker, plan is to discharge home with hospice once medically appropriate.  Please call with any questions.  Sharen Heck [facility] Liaison [phone]

## 2015-03-23 NOTE — Progress Notes (Signed)
Pt with scant nose bleed, left nare. Pt admits to picking at it. Instructed not pick. Now appears to be controlled.

## 2015-03-23 NOTE — Care Management Note (Signed)
Case Management Note  Patient Details  Name: Francisco [name] MRN: [id number] Date of Birth: Sep 29, 1965  Subjective/Objective:    Pt is an active home hospice pt admitted with GI bleed                Action/Plan:  Pt is active with [facility] for home hospice.   Pt wishes to return home post discharge in the care of his sister and mother residing in [place].  CM contacted [facility] liaison Chelsea Aus: verfied that pt is active with agency, start date of 03/05/15, hospice has assumed payment coverage for pt. [facility] is also following pt care in hospital.  Per Chelsea Aus; agency will continue to follow while in hospital then resume services post discharge.  CM will continue to monitor for disposition needs.   Expected Discharge Date:                  Expected Discharge Plan:  Home w Hospice Care  In-House Referral:  Hospice / Palliative Care  Discharge planning Services  CM Consult  Post Acute Care Choice:    Choice offered to:     DME Arranged:    DME Agency:     HH Arranged:    HH Agency:     Status of Service:  In process, will continue to follow  Medicare Important Message Given:    Date Medicare IM Given:    Medicare IM give by:    Date Additional Medicare IM Given:    Additional Medicare Important Message give by:     If discussed at Long Length of Stay Meetings, dates discussed:    Additional Comments: CM assessed pt.  Pt had increased work of breathing with a noticeably distended abdomen, asking when he could go home.  CM verified with pt that his post discharge plan is to return home with hospice. Cherylann Parr, RN 03/23/2015, 10:49 AM

## 2015-03-24 ENCOUNTER — Inpatient Hospital Stay (HOSPITAL_COMMUNITY): Payer: Medicaid - Out of State

## 2015-03-24 ENCOUNTER — Telehealth: Payer: Self-pay

## 2015-03-24 LAB — AMMONIA: Ammonia: 63 umol/L — ABNORMAL HIGH (ref 9–35)

## 2015-03-24 NOTE — Progress Notes (Addendum)
[facility] PROGRESS NOTE  Francisco [name] ZOX:[id number] DOB: Jan 30, 1966 DOA: 03/20/2015 PCP: Pcp Not In System   PCCM Transfer 9/20 Brief narrative: 49 year old who presented with decompensated alcoholic cirrhosis and history of hep C, varices, ascites, and CRF under Hospice care. Status post admission in August 8/6 through 8/26 for SBP. Presented to the hospital secondary to hematemesis.  Assessment/Plan:   Upper GI bleed -EGD with severe esophagitis, h/o Varices and Banding -s/p 2 units PRBC and 3 units FFP  Acute blood loss anemia -s/p 2 units PRBC  End stage Liver disease -due to ETOH and Hep C -decompensated cirrhosis with portal hypertension varices, ascites and encephalopathy -high Meld score of 24 on admission, now 14  -GI following, felt to have very poor prognosis and recommended Hospice -Followed by Hospice , but wants Full Code and aggressive full scope of Rx -will consult Palliative care -not TIPS candidate per GI  Portal HTN with Ascites -status post paracentesis, no SBP, cultures NGTD  H/o Hepatic encephalopathy -on lactulose and xifamximin -mentation improving  Chronic diastolic CHF  Code Status: Full Family Communication: wife and mother at bedside Disposition Plan: Pending work up and culture results   Consultants:  GI  Procedures:  Paracentesis and EGD  Antibiotics:  rifaximin  HPI/Subjective: Feels better  Objective: Filed Vitals:   03/24/15 0608  BP: 91/61  Pulse: 115  Temp: 98.4 F (36.9 C)  Resp: 18    Intake/Output Summary (Last 24 hours) at 03/24/15 0821 Last data filed at 03/24/15 0609  Gross per 24 hour  Intake    672 ml  Output    750 ml  Net    -78 ml   Filed Weights   03/22/15 0500 03/23/15 0431 03/24/15 0608  Weight: 110 kg (242 lb 8.1 oz) 106 kg (233 lb 11 oz) 88 kg (194 lb 0.1 oz)    Exam:   General:  Patient in no acute distress, alert and awake, chronically ill appearing  Cardiovascular: Regular  rate and rhythm, no rubs  Respiratory: No increased work of breathing, no wheezes  Abdomen: Distended, positive bowel sounds, nontender  Musculoskeletal: No cyanosis or clubbing   Data Reviewed: Basic Metabolic Panel:  Recent Labs Lab 03/20/15 0848 03/21/15 1217 03/22/15 0500 03/22/15 2204 03/23/15 0407  NA 133* 135 134* 136 138  K 4.0 3.7 3.2* 3.9 3.6  CL 102 106 107 108 110  CO2 20* 23  GLUCOSE 110* 87 87 84 85  BUN <5* CREATININE 1.40* 1.13 1.12 1.04 0.99  CALCIUM 8.0* 7.6* 7.2* 7.7* 8.0*  MG  --  1.9  --   --   --   PHOS  --  3.0  --   --   --    Liver Function Tests:  Recent Labs Lab 03/18/15 1046 03/20/15 0848 03/23/15 0407  AST 56* 64* 43*  ALT ALKPHOS 77 75 58  BILITOT 2.5* 2.4* 2.0*  PROT 7.7 7.6 6.1*  ALBUMIN 1.6* 1.4* 1.7*    Recent Labs Lab 03/20/15 0848  LIPASE 25    Recent Labs Lab 03/20/15 0805 03/21/15 1218  AMMONIA 48* 87*   CBC:  Recent Labs Lab 03/18/15 1103  03/20/15 0848 03/20/15 1700 03/21/15 0044 03/21/15 1247 03/21/15 2345 03/23/15 0407  WBC 7.3  --  9.3 7.5 14.1* 12.9* 9.5 7.1  NEUTROABS 3.4  --  5.8  --   --   --   --  3.0  HGB 9.1*  < > 8.3* 6.3* 9.3* 8.6* 8.0* 8.2*  HCT 27.1*  < > 24.9* 17.8* 27.7* 25.4* 23.9* 24.9*  MCV 80.4  --  80.6 80.9 81.2 80.9 81.0 82.5  PLT 123*  --  114* 77* 72* 66* 65* 48*  < > = values in this interval not displayed. Cardiac Enzymes: No results for input(s): CKTOTAL, CKMB, CKMBINDEX, TROPONINI in the last 168 hours. BNP (last 3 results)  Recent Labs  02/06/15 1138  BNP 25.4    ProBNP (last 3 results) No results for input(s): PROBNP in the last 8760 hours.  CBG:  Recent Labs Lab 03/20/15 1315  GLUCAP 103*    Recent Results (from the past 240 hour(s))  MRSA PCR Screening     Status: None   Collection Time: 03/20/15  1:29 PM  Result Value Ref Range Status   MRSA by PCR NEGATIVE NEGATIVE Final    Comment:        The GeneXpert MRSA Assay  (FDA approved for NASAL specimens only), is one component of a comprehensive MRSA colonization surveillance program. It is not intended to diagnose MRSA infection nor to guide or monitor treatment for MRSA infections.   Culture, body fluid-bottle     Status: None (Preliminary result)   Collection Time: 03/22/15 12:12 PM  Result Value Ref Range Status   Specimen Description FLUID PERITONEAL CAVITY  Final   Special Requests AEB  Final   Culture NO GROWTH 1 DAY  Final   Report Status PENDING  Incomplete  Gram stain     Status: None   Collection Time: 03/22/15 12:12 PM  Result Value Ref Range Status   Specimen Description FLUID PERITONEAL CAVITY  Final   Special Requests NONE  Final   Gram Stain   Final    FEW WBC PRESENT, PREDOMINANTLY MONONUCLEAR NO ORGANISMS SEEN    Report Status 03/22/2015 FINAL  Final     Studies: No results found.  Scheduled Meds: . sodium chloride   Intravenous Once  . antiseptic oral rinse  7 mL Mouth Rinse q12n4p  . antiseptic oral rinse  7 mL Mouth Rinse QID  . chlorhexidine  15 mL Mouth Rinse BID  . lactulose  30 g Oral TID  . pantoprazole (PROTONIX) IV  40 mg Intravenous Q12H  . rifaximin  550 mg Oral BID  . sucralfate  1 g Oral TID WC & HS   Continuous Infusions: . sodium chloride 10 mL/hr at 03/22/15 0830    Time spent:    JOSEPH,PREETHA  [facility] Pager [phone] If 7PM-7AM, please contact night-coverage at [email or web address], password [facility] 03/24/2015, 8:21 AM  LOS: 4 days

## 2015-03-24 NOTE — Telephone Encounter (Signed)
Nursing Director of Clinical Care @ hospice Melene Muller didn't answer, so CMA left message to return call concerning the reason for her voice message left on 03/09/15.

## 2015-03-24 NOTE — Progress Notes (Signed)
Inpatient [facility] 49M 11 [facility] RN visit.   This is a related admission to [facility] DX of alcoholic cirrhosis of the liver and esophagel varices with bleeding. Patient is a Full Code. Patient sleeping but awakens to name. He states he is hoping to go home tomorrow. He denies any pain today.No family present in the room. Pt. Falls back asleep easily.  Staff RN Marge reports that he has not taken anything in PO today. A Palliative Care consult has been ordered. [facility] will continue to follow daily.  Per [facility] social worker, plan is to discharge home with hospice once medically appropriate.  Please call with any questions.  Sharen Heck [facility] Liaison [phone]

## 2015-03-25 ENCOUNTER — Inpatient Hospital Stay (HOSPITAL_COMMUNITY): Payer: Medicaid - Out of State

## 2015-03-25 DIAGNOSIS — K2921 Alcoholic gastritis with bleeding: Secondary | ICD-10-CM

## 2015-03-25 DIAGNOSIS — R112 Nausea with vomiting, unspecified: Secondary | ICD-10-CM

## 2015-03-25 DIAGNOSIS — R1110 Vomiting, unspecified: Secondary | ICD-10-CM | POA: Insufficient documentation

## 2015-03-25 DIAGNOSIS — Z515 Encounter for palliative care: Secondary | ICD-10-CM | POA: Insufficient documentation

## 2015-03-25 LAB — BASIC METABOLIC PANEL
Anion gap: 3 — ABNORMAL LOW (ref 5–15)
BUN: 7 mg/dL (ref 6–20)
CHLORIDE: 111 mmol/L (ref 101–111)
CO2: 24 mmol/L (ref 22–32)
Calcium: 7.6 mg/dL — ABNORMAL LOW (ref 8.9–10.3)
Creatinine, Ser: 0.97 mg/dL (ref 0.61–1.24)
GFR calc Af Amer: >60 mL/min (ref >60)
GFR calc non Af Amer: >60 mL/min (ref >60)
Glucose, Bld: 86 mg/dL (ref 65–99)
POTASSIUM: 3.7 mmol/L (ref 3.5–5.1)
SODIUM: 138 mmol/L (ref 135–145)

## 2015-03-25 LAB — CBC
HEMATOCRIT: 27.4 % — AB (ref 39.0–52.0)
Hemoglobin: 9 g/dL — ABNORMAL LOW (ref 13.0–17.0)
MCH: 27.8 pg (ref 26.0–34.0)
MCHC: 32.8 g/dL (ref 30.0–36.0)
MCV: 84.6 fL (ref 78.0–100.0)
Platelets: 61 10*3/uL — ABNORMAL LOW (ref 150–400)
RBC: 3.24 MIL/uL — ABNORMAL LOW (ref 4.22–5.81)
RDW: 17.3 % — AB (ref 11.5–15.5)
WBC: 7.8 10*3/uL (ref 4.0–10.5)

## 2015-03-25 MED ORDER — POLYETHYLENE GLYCOL 3350 17 G PO PACK
17.0000 g | PACK | Freq: Two times a day (BID) | ORAL | Status: DC
  Administered 2015-03-25 – 2015-03-27 (×4): 17 g via ORAL
  Filled 2015-03-25 (×6): qty 1

## 2015-03-25 MED ORDER — OXYCODONE HCL 5 MG PO TABS: 5.0000 mg | ORAL_TABLET | ORAL | Status: DC | PRN

## 2015-03-25 MED ORDER — BISACODYL 10 MG RE SUPP
10.0000 mg | Freq: Once | RECTAL | Status: AC
  Administered 2015-03-25: 10 mg via RECTAL
  Filled 2015-03-25 (×2): qty 1

## 2015-03-25 MED ORDER — PANTOPRAZOLE SODIUM 40 MG PO TBEC: 40.0000 mg | DELAYED_RELEASE_TABLET | Freq: Two times a day (BID) | ORAL | Status: DC

## 2015-03-25 MED ORDER — POLYETHYLENE GLYCOL 3350 17 G PO PACK: 17.0000 g | PACK | Freq: Every day | ORAL | Status: DC

## 2015-03-25 MED ORDER — TEMAZEPAM 7.5 MG PO CAPS
7.5000 mg | ORAL_CAPSULE | Freq: Every day | ORAL | Status: DC
  Administered 2015-03-27: 7.5 mg via ORAL
  Filled 2015-03-25 (×2): qty 1

## 2015-03-25 MED ORDER — ONDANSETRON 4 MG PO TBDP: 4.0000 mg | ORAL_TABLET | Freq: Three times a day (TID) | ORAL | Status: DC | PRN

## 2015-03-25 NOTE — Consult Note (Signed)
Consultation Note Date: 03/25/2015   Patient Name: Francisco [name]  DOB: 1966-03-28  MRN: [id number]  Age / Sex: 49 y.o., male   PCP: Pcp Not In System Referring Physician: Domenic Polite, MD  Reason for Consultation: Establishing goals of care  Palliative Care Assessment and Plan Summary of Established Goals of Care and Medical Treatment Preferences  49 year old who presented with decompensated alcoholic cirrhosis and history of hep C, varices, ascites, and CRF under Hospice care. Status post admission in August 8/6 through 8/26 for SBP. Presented to the hospital secondary to hematemesis.  Patient has been seen by palliative care in previous hospitalization. Patient has been admitted with upper GIB, acute blood loss anemia. He has been stabilized with PRBC as well as FFP. He has been seen by GI, he is with life limiting illness of decompensated cirrhosis with portal hypertension varices, ascites   A palliative consult has been called for further discussions.   Met with the patient, his mother, his wife, his sister and his cousin in the patient's room. Also present were [facility] liaisons Lisa and LCSW from [facility]. When asked how much he understood about his condition, the patient states, I know I'm on my last leg". After introductions were made, the family states that they would like for the hospice aid to come by more often, they also want increased RN participation. The patient had a bout of vomiting in the course of the meeting. The patient received Zofran IV PRN. The patient believes he is being given too many medications all at once, and that is causing him to have nausea and vomiting. The patient and family became tearful.   We discussed symptom management of nausea vomiting and insomnia. I have added restoril and Zofran PO ODT to his medication regimen here.   When code status discussions were undertaken, the patient became tearful and said, "I'm going to cut you off right here. I know I'm dying,  I know there's nothing else you all can do for me. I don't like coming to the hospital anymore. Ain't nothing you'll can do that's going to help me in this hospital. I don't want to die in a hospital, I want to die at home. I want to go home as soon as possible."      Contacts/Participants in Discussion: Primary Decision Maker:  Patient is decisional, then his wife, mother.    HCPOA: yes   wife  Code Status/Advance Care Planning:  Extensive discussions regarding use of CPR, intubation, mechanical ventilation, ICU level of care. Discussed that patient has a terminal irreversible illness. The patient wishes to be at home, he wishes to die at home, he does not want to come back to the hospital. Hence, discussed about establishing DNR.   Symptom Management:   Insomnia: will add restoril, please continue this on D/C  Nausea vomiting: will add PO ODT Zofran. Please continue this on D/C  Palliative Prophylaxis: yes. Patient now on Zofran PO and also on Lactulose.   Additional Recommendations (Limitations, Scope, Preferences):  Home with hospice  [facility] to continue to support the patient and family at home, continue comfort measures, increase/ maximize presence, control symptoms, manage end of life care/experience for the patient as well as the family members. Discussed with patient about possible [employer] transfer, patient wants to go home first, if his needs are not met at home, then he is open to transferring to [facility].  Psycho-social/Spiritual:   Support System: strong, patient has his mother, sister, wife and  cousin for support. Patient has 2 step children that he considers as his own, he also has a grand child he is close to.   Desire for further Chaplaincy support:no. Patient states, " I can talk to a chaplain all day, but it's not going to change what's going to happen to me." yet, he voices faith and hope and endorsed the family's belief system in maintaining hope.    Prognosis: < 2 weeks  Discharge Planning:  Home with Hospice   Values: " I know there's nothing else you can do for me, I don't want to come back to the hospital. I know I'm on my last leg here." Life limiting illness: sequelae of end stage liver disease.       Chief Complaint/History of Present Illness: hematemesis  Primary Diagnoses  Present on Admission:  . GI bleed . Upper GI bleed . Acute respiratory failure with hypoxia  Palliative Review of Systems:  1. Insomnia 2. Abdominal discomfort from ascites 3. Nausea vomiting  I have reviewed the medical record, interviewed the patient and family, and examined the patient. The following aspects are pertinent.  Past Medical History  Diagnosis Date  . Hepatic cirrhosis   . Esophageal varices     last banding in [place] June 2016  . Chronic renal failure     was on hemodialysis in June 2016  . Congestive heart failure     questionable hx & on lasix  . Ascites   . Renal insufficiency    Social History   Social History  . Marital Status: Married    Spouse Name: N/A  . Number of Children: N/A  . Years of Education: N/A   Social History Main Topics  . Smoking status: Former Smoker    Quit date: 10/02/2014  . Smokeless tobacco: Never Used     Comment: off and on for 20 years  . Alcohol Use: No     Comment: Quit drinking EtOH prior to prolonged admission in April 2016  . Drug Use: No  . Sexual Activity: Not Asked   Other Topics Concern  . None   Social History Narrative   Patient is from [place]. Hospitalized with variceal bleed and banding this Spring and discharged to [facility] June 2016. Discharged from SNF 1 week ago. Previously worked as a Art gallery manager. Married.   Family History  Problem Relation Age of Onset  . COPD Mother   . Asthma Mother   . Leukemia Maternal Grandmother   . Congestive Heart Failure Maternal Grandfather   . Cancer Other     2 aunts with known ca   Scheduled Meds: . sodium chloride    Intravenous Once  . antiseptic oral rinse  7 mL Mouth Rinse q12n4p  . antiseptic oral rinse  7 mL Mouth Rinse QID  . chlorhexidine  15 mL Mouth Rinse BID  . lactulose  30 g Oral TID  . pantoprazole (PROTONIX) IV  40 mg Intravenous Q12H  . rifaximin  550 mg Oral BID  . sucralfate  1 g Oral TID WC & HS  . temazepam  7.5 mg Oral QHS   Continuous Infusions: . sodium chloride 10 mL/hr at 03/22/15 0830   PRN Meds:.ondansetron (ZOFRAN) IV, ondansetron Medications Prior to Admission:  Prior to Admission medications   Medication Sig Start Date End Date Taking? Authorizing Provider  albuterol (PROVENTIL HFA;VENTOLIN HFA) 108 (90 BASE) MCG/ACT inhaler Inhale 1-2 puffs into the lungs every 6 (six) hours as needed for wheezing or shortness of  breath. 03/03/15  Yes Arnoldo Morale, MD  Ca Carbonate-Mag Hydroxide (ROLAIDS) 550-110 MG CHEW Chew 1 tablet by mouth 3 (three) times daily as needed (for heartburn).    Yes Historical Provider, MD  feeding supplement, ENSURE ENLIVE, (ENSURE ENLIVE) LIQD Take 237 mLs by mouth 2 (two) times daily between meals. 02/26/15  Yes Venetia Maxon [name], MD  furosemide (LASIX) 40 MG tablet Take 0.5 tablets (20 mg total) by mouth 2 (two) times daily. 03/03/15  Yes Arnoldo Morale, MD  hydrocortisone 1 % lotion Apply 1 application topically 2 (two) times daily. 03/18/15  Yes Arnoldo Morale, MD  lactulose (CHRONULAC) 10 GM/15ML solution Take 45 mLs (30 g total) by mouth every 8 (eight) hours. 02/26/15  Yes Venetia Maxon [name], MD  magnesium oxide (MAG-OX) 400 (241.3 MG) MG tablet Take 1 tablet (400 mg total) by mouth 2 (two) times daily. 02/26/15  Yes Venetia Maxon [name], MD  nystatin (MYCOSTATIN/NYSTOP) 100000 UNIT/GM POWD Apply 1 Bottle topically 3 (three) times daily. 03/10/15  Yes Arnoldo Morale, MD  oxyCODONE (OXY IR/ROXICODONE) 5 MG immediate release tablet Take 5 mg by mouth every 4 (four) hours as needed for moderate pain.  02/26/15  Yes Historical Provider, MD  potassium chloride SA  (K-DUR,KLOR-CON) 20 MEQ tablet Take 1 tablet (20 mEq total) by mouth 3 (three) times daily. 02/26/15  Yes Venetia Maxon [name], MD  spironolactone (ALDACTONE) 25 MG tablet Take 1 tablet (25 mg total) by mouth daily. 03/10/15  Yes Arnoldo Morale, MD  traMADol (ULTRAM) 50 MG tablet Take 1 tablet (50 mg total) by mouth every 12 (twelve) hours as needed. Patient taking differently: Take 50 mg by mouth every 12 (twelve) hours as needed for moderate pain.  03/18/15  Yes Arnoldo Morale, MD   No Known Allergies CBC:    Component Value Date/Time   WBC 7.8 03/25/2015 0550   HGB 9.0* 03/25/2015 0550   HCT 27.4* 03/25/2015 0550   PLT 61* 03/25/2015 0550   MCV 84.6 03/25/2015 0550   NEUTROABS 3.0 03/23/2015 0407   LYMPHSABS 2.6 03/23/2015 0407   MONOABS 1.0 03/23/2015 0407   EOSABS 0.4 03/23/2015 0407   BASOSABS 0.0 03/23/2015 0407   Comprehensive Metabolic Panel:    Component Value Date/Time   NA 138 03/25/2015 0550   K 3.7 03/25/2015 0550   CL 111 03/25/2015 0550   CO2 24 03/25/2015 0550   BUN 7 03/25/2015 0550   CREATININE 0.97 03/25/2015 0550   CREATININE 1.18 03/18/2015 1046   GLUCOSE 86 03/25/2015 0550   CALCIUM 7.6* 03/25/2015 0550   AST 43* 03/23/2015 0407   ALT 19 03/23/2015 0407   ALKPHOS 58 03/23/2015 0407   BILITOT 2.0* 03/23/2015 0407   PROT 6.1* 03/23/2015 0407   ALBUMIN 1.7* 03/23/2015 0407    Physical Exam: Vital Signs: BP 86/59 mmHg  Pulse 115  Temp(Src) 98 F (36.7 C) (Oral)  Resp 20  Ht 5' 10"  (1.778 m)  Wt 105.6 kg (232 lb 12.9 oz)  BMI 33.40 kg/m2  SpO2 99% SpO2: SpO2: 99 % O2 Device: O2 Device: Not Delivered O2 Flow Rate: O2 Flow Rate (L/min): 2 L/min Intake/output summary:  Intake/Output Summary (Last 24 hours) at 03/25/15 1014 Last data filed at 03/25/15 0849  Gross per 24 hour  Intake    560 ml  Output    400 ml  Net    160 ml   LBM: Last BM Date: 03/23/15 Baseline Weight: Weight: 106.9 kg (235 lb 10.8 oz) Most recent weight: Weight: 105.6  kg (232 lb  12.9 oz)  Exam Findings:   mild acute distress, patient with 1 episode of vomiting.  Chest appears clear, no dyspnea Abdomen distended ascitic Extremities with mild clubbing trace edema Non focal                        Palliative Performance Scale: 30% Additional Data Reviewed: Recent Labs     03/23/15  0407  03/25/15  0550  WBC  7.1  7.8  HGB  8.2*  9.0*  PLT  48*  61*  NA  138  138  BUN  6  7  CREATININE  0.99  0.97     Time In: 0915 Time Out: 1015 Time Total: 60 min  Greater than 50%  of this time was spent counseling and coordinating care related to the above assessment and plan.  Signed by: Loistine Chance, MD [place], MD  03/25/2015, 10:14 AM  Please contact Palliative Medicine Team phone at [phone] for questions and concerns.

## 2015-03-25 NOTE — Progress Notes (Signed)
Inpatient [facility] 59M [facility] RN visit.   This is a related admission to [place] DX of alcoholic cirrhosis of the liver and esophagel varices with bleeding. Patient is a Full Code.  The patient  his wife, mother, cousin and sister met with Dr. Rowe Pavy, this writer and [facility] LCSW to discuss his needs and desires for care going forward. Pt. States he wants to go home. Family is requesting an increase in hospice aide visits, an rx for sleep and an rx for nausea/vomiting. Family declines any need for hospital equipment at this time.  Pt. vomited a large  amount of  liquid during consult and Staff RN Jerene Pitch gave him 4 mg of Zofran IV. Patient and  family reassured and emotional support provided. Please call with any questions.  [facility] Liaison [phone]

## 2015-03-25 NOTE — Discharge Summary (Deleted)
Physician Discharge Summary  Francisco [name] ZOX:[id number] DOB: [birth year] DOA: 03/20/2015  PCP: Pcp Not In System  Admit date: 03/20/2015 Discharge date: 03/28/2015  Time spent: 45 minutes  Recommendations for Outpatient Follow-up:  Home with Hospice for End of life care, patient open to transferring to [facility] when he deteriorates further at home and cannot be managed  Discharge Diagnoses:  Active Problems:   Acute respiratory failure with hypoxia   GI bleed   Upper GI bleed   Endotracheally intubated   Alcoholic cirrhosis of liver with ascites   Erosive esophagitis   Portal hypertension   End stage liver disease   Coagulopathy   Vomiting   Encounter for palliative care   Ileus  Discharge Condition: poor  Diet recommendation: liquid diet, advance as tolerated  Filed Weights   03/26/15 0454 03/27/15 0322 03/28/15 0416  Weight: 106.7 kg (235 lb 3.7 oz) 106.9 kg (235 lb 10.8 oz) 104.1 kg (229 lb 8 oz)    History of present illness:  49 yo male with hx ETOH cirrhosis, varices, chronic renal failure, ascites with recent admit (august) for sepsis r/t SBP requiring elective intubation for endoscopy during that admission. Returned 9/17 with hematemesis and melena   Hospital Course:   Upper GI bleed -has melena and hematemesis on admission -was seen by [facility] and underwent an EGD which showed severe esophagitis, h/o Varices and h/o Banding -was Transfused 2 units PRBC and 3 units FFP this admission. -no heavy bleeding since then -not TIPS candidate per GI  Acute blood loss anemia -s/p 2 units PRBC this admission  End stage Liver disease -due to ETOH and Hep C -decompensated cirrhosis with portal hypertension varices, ascites and encephalopathy -high Meld score of 24 on admission, now 14  -Seen by GI , felt to have very poor prognosis and recommended Hospice -Followed by Hospice prior to admission, but wanted Full Code and aggressive full scope of Rx, hence  Palliative was consulted, had a family meeting today and decision made for DNR, and patient wants to go home with Hospice, he understands he's dying. -he is adamant to go home with Hospice and is agreeable to consider [facility] if he could not be managed at Home  Portal HTN with Ascites -status post paracentesis, no SBP, cultures NGTD -repeat Paracentesis 9/24  Ileus: -multifactorial, had multiple episodes of emesis 9/22 and 9/23 , Kub with ileus, refuses NG decompression, now tolerating clears and full liquids, had a BM last night and feels well enough that he can manage his symptoms at home.  H/o Hepatic encephalopathy -on lactulose and xifaximin -mentation improved  Chronic diastolic CHF  Consultations:  Palliative care  Discharge Exam: Filed Vitals:   03/28/15 1442  BP: 102/80  Pulse: 129  Temp: 99.1 F (37.3 C)  Resp: 17    General: AAOx2 Cardiovascular: S1S2/RRR Respiratory: CTAB  Discharge Instructions    Current Discharge Medication List    START taking these medications   Details  ondansetron (ZOFRAN ODT) 4 MG disintegrating tablet Take 1 tablet (4 mg total) by mouth every 6 (six) hours as needed for nausea or vomiting. Qty: 30 tablet, Refills: 0    pantoprazole (PROTONIX) 40 MG tablet Take 1 tablet (40 mg total) by mouth 2 (two) times daily. Qty: 30 tablet, Refills: 0    polyethylene glycol (MIRALAX / GLYCOLAX) packet Take 17 g by mouth 2 (two) times daily. Can cut down once having daily BMs Qty: 14 each, Refills: 0  CONTINUE these medications which have CHANGED   Details  oxyCODONE (OXY IR/ROXICODONE) 5 MG immediate release tablet Take 1 tablet (5 mg total) by mouth every 4 (four) hours as needed for moderate pain. Qty: 30 tablet, Refills: 0      CONTINUE these medications which have NOT CHANGED   Details  albuterol (PROVENTIL HFA;VENTOLIN HFA) 108 (90 BASE) MCG/ACT inhaler Inhale 1-2 puffs into the lungs every 6 (six) hours as needed for  wheezing or shortness of breath. Qty: 1 each, Refills: 2    Ca Carbonate-Mag Hydroxide (ROLAIDS) 550-110 MG CHEW Chew 1 tablet by mouth 3 (three) times daily as needed (for heartburn).     feeding supplement, ENSURE ENLIVE, (ENSURE ENLIVE) LIQD Take 237 mLs by mouth 2 (two) times daily between meals. Qty: 237 mL, Refills: 12    furosemide (LASIX) 40 MG tablet Take 0.5 tablets (20 mg total) by mouth 2 (two) times daily. Qty: 60 tablet, Refills: 2    hydrocortisone 1 % lotion Apply 1 application topically 2 (two) times daily. Qty: 118 mL, Refills: 1    lactulose (CHRONULAC) 10 GM/15ML solution Take 45 mLs (30 g total) by mouth every 8 (eight) hours. Qty: 240 mL, Refills: 3    magnesium oxide (MAG-OX) 400 (241.3 MG) MG tablet Take 1 tablet (400 mg total) by mouth 2 (two) times daily. Qty: 60 tablet, Refills: 3    potassium chloride SA (K-DUR,KLOR-CON) 20 MEQ tablet Take 1 tablet (20 mEq total) by mouth 3 (three) times daily. Qty: 90 tablet, Refills: 3      STOP taking these medications     nystatin (MYCOSTATIN/NYSTOP) 100000 UNIT/GM POWD      spironolactone (ALDACTONE) 25 MG tablet      traMADol (ULTRAM) 50 MG tablet        No Known Allergies Follow-up Information    Follow up with Hospice at [facility]. Schedule an appointment as soon as possible for a visit in 2 weeks.   Specialty:  Hospice and Palliative Medicine   Contact information:   [address] [place] [address] [phone]        The results of significant diagnostics from this hospitalization (including imaging, microbiology, ancillary and laboratory) are listed below for reference.    Significant Diagnostic Studies: Ct Abdomen Pelvis Wo Contrast  03/20/2015   CLINICAL DATA:  Vomiting blood twice last night.  Blood in stool.  EXAM: CT ABDOMEN AND PELVIS WITHOUT CONTRAST  TECHNIQUE: Multidetector CT imaging of the abdomen and pelvis was performed following the standard protocol without IV contrast.   COMPARISON:  None.  FINDINGS: Lower chest: Clear lung bases. Normal heart size. Distal esophageal dilatation with fluid within the esophagus.  Hepatobiliary: Diminutive size of the liver with a micronodular contour. No focal hepatic mass. Cholelithiasis. No intrahepatic or extrahepatic biliary duct dilatation. Massive ascites.  Pancreas: Normal.  Spleen: Normal.  Adrenals/Urinary Tract: Normal adrenal glands. Normal kidneys. Normal bladder.  Stomach/Bowel: No small bowel dilatation or obstruction. No pneumatosis, pneumoperitoneum or portal venous gas. Multiple small gastric varices.  Vascular/Lymphatic: Normal caliber abdominal aorta. No abdominal or pelvic lymphadenopathy.  Other: No focal fluid collection or hematoma. Generalized body wall edema.  Musculoskeletal: No acute osseous abnormality. No lytic or sclerotic osseous lesion.  IMPRESSION: 1. Cirrhotic liver.  Massive ascites. 2. Cholelithiasis. 3. Distal esophageal dilatation with fluid in the esophagus which may reflect gastroesophageal reflux.   Electronically Signed   By: Elige Ko   On: 03/20/2015 10:23   Dg Abd 1 View  03/28/2015   CLINICAL DATA:  Ileus  EXAM: ABDOMEN - 1 VIEW  COMPARISON:  03/27/2015  FINDINGS: Diffuse gaseous distention of bowel again noted, similar to prior study. No free air. No organomegaly or suspicious calcification.  IMPRESSION: Continued mild diffuse gaseous distention of bowel, likely ileus. No significant change.   Electronically Signed   By: Charlett Nose M.D.   On: 03/28/2015 08:23   Dg Abd 1 View  03/27/2015   CLINICAL DATA:  Ileus.  EXAM: ABDOMEN - 1 VIEW  COMPARISON:  03/26/2015  FINDINGS: Limited evaluation for free air given supine technique. Mild gaseous distension of multiple small bowel loops is stable to slightly increased from the prior study. There is a moderate amount of stool in the left colon. Increased density peripherally in the abdomen likely reflects ascites.  IMPRESSION: Stable to slightly  increased gaseous distention of small bowel which may reflect ileus.   Electronically Signed   By: Sebastian Ache M.D.   On: 03/27/2015 09:20   Dg Abd 1 View  03/26/2015   CLINICAL DATA:  Abdominal distention.  EXAM: ABDOMEN - 1 VIEW  COMPARISON:  03/25/2015  FINDINGS: There is slight improvement in the degree of distention of small bowel loops, seen in the central of the abdomen, with relative paucity of gas peripherally, consistent with the presence of large volume ascites. Colonic bowel gas pattern is normal where seen.  IMPRESSION: Slight improvement in the degree of small bowel dilation, likely representing improving ileus.  Large volume abdominal ascites.   Electronically Signed   By: Ted Mcalpine M.D.   On: 03/26/2015 08:51   Dg Abd 1 View  03/25/2015   CLINICAL DATA:  Abdominal distension, ileus  EXAM: ABDOMEN - 1 VIEW  COMPARISON:  11/21/2014  FINDINGS: Again noted moderate gaseous distended small bowel loops suspicious for ileus or partial obstruction. There is some gas in proximal colon. Stool noted in descending colon.  IMPRESSION: Again noted moderate gaseous distended small bowel loops suspicious for ileus or partial obstruction. There is some gas in proximal colon. Stool noted in descending colon. No significant colonic distension.   Electronically Signed   By: Natasha Mead M.D.   On: 03/25/2015 10:06   Dg Abd 1 View  03/24/2015   CLINICAL DATA:  Nausea and vomiting for 2 days.  EXAM: ABDOMEN - 1 VIEW  COMPARISON:  03/20/2015  FINDINGS: The bowel loops are located along the central aspect of the abdomen and this is compatible with the known abdominal ascites. Rectal tube or probe is present. Gas in the rectum. Mildly dilated loops of small bowel with some gas in the transverse colon. Limited evaluation for free air on these supine images.  IMPRESSION: Mildly dilated loops of small bowel with some gas in the colon. Findings are nonspecific but the differential would include an early ileus  or obstructive process. Consider follow up imaging.   Electronically Signed   By: Richarda Overlie M.D.   On: 03/24/2015 16:57   US Paracentesis  03/27/2015   CLINICAL DATA:  End-stage liver disease with recurrent ascites. Request therapeutic paracentesis of up to 4 L.  EXAM: ULTRASOUND GUIDED PARACENTESIS  COMPARISON:  Previous paracentesis  PROCEDURE: An ultrasound guided paracentesis was thoroughly discussed with the patient and questions answered. The benefits, risks, alternatives and complications were also discussed. The patient understands and wishes to proceed with the procedure. Written consent was obtained.  Ultrasound was performed to localize and mark an adequate pocket of fluid in the right  lower quadrant of the abdomen. The area was then prepped and draped in the normal sterile fashion. 1% Lidocaine was used for local anesthesia. Under ultrasound guidance a Safe-T-Centesis catheter was introduced. Paracentesis was performed. The catheter was removed and a dressing applied.  COMPLICATIONS: None immediate  FINDINGS: A total of approximately 4 L of clear, amber colored fluid was removed. A fluid sample was not sent for laboratory analysis.  IMPRESSION: Successful ultrasound guided paracentesis yielding 4 L of ascites.  Read by: Brayton El PA-C   Electronically Signed   By: Gilmer Mor D.O.   On: 03/27/2015 11:08   US Paracentesis  03/03/2015   CLINICAL DATA:  Abdominal distention. Alcoholic cirrhosis. Massive ascites. Request diagnostic and therapeutic paracentesis of up to 4 liters maximum.  EXAM: ULTRASOUND GUIDED LEFT LOWER QUADRANT PARACENTESIS  COMPARISON:  None.  PROCEDURE: An ultrasound guided paracentesis was thoroughly discussed with the patient and questions answered. The benefits, risks, alternatives and complications were also discussed. The patient understands and wishes to proceed with the procedure. Written consent was obtained.  Ultrasound was performed to localize and mark an  adequate pocket of fluid in the left lower quadrant of the abdomen. The area was then prepped and draped in the normal sterile fashion. 1% Lidocaine was used for local anesthesia. Under ultrasound guidance a 19 gauge Yueh catheter was introduced. Paracentesis was performed. The catheter was removed and a dressing applied.  COMPLICATIONS: None.  FINDINGS: A total of approximately 4 liters of clear yellow fluid was removed. A fluid sample was not sent for laboratory analysis.  IMPRESSION: Successful ultrasound guided paracentesis yielding 4 liters of ascites.  Read by:  Corrin Parker, PA-C   Electronically Signed   By: Malachy Moan M.D.   On: 03/03/2015 14:22   Dg Chest Port 1 View  03/21/2015   CLINICAL DATA:  Aspiration.  Intubation.  EXAM: PORTABLE CHEST - 1 VIEW  COMPARISON:  03/20/2015  FINDINGS: The endotracheal tube has been repositioned. The tip is 2.1 cm above the carina. The cardiac silhouette, mediastinal and hilar contours are stable. Persistent low lung volumes with vascular crowding and streaky atelectasis. Vascular congestion and possible mild interstitial edema but no pleural effusion.  IMPRESSION: Endotracheal tube in good position, 2.1 cm above the carina.  Persistent low lung volumes with vascular crowding, atelectasis, vascular congestion and possible mild interstitial edema.   Electronically Signed   By: Rudie Meyer M.D.   On: 03/21/2015 08:11   Dg Chest Port 1 View  03/20/2015   CLINICAL DATA:  Endotracheal tube placement  EXAM: PORTABLE CHEST - 1 VIEW  COMPARISON:  03/01/2015  FINDINGS: Endotracheal tube with the tip in the right mainstem bronchus. Recommend retracting the endotracheal tube 3.5 cm.  There are low lung volumes. There is prominence of the central pulmonary vasculature likely secondary to hypoinflation. There is no focal consolidation, pleural effusion or pneumothorax. The heart and mediastinum are stable.  Severe osteoarthritis of the right glenohumeral joint. Moderate  osteoarthritis of the left glenohumeral joint.  IMPRESSION: 1. Endotracheal tube with the tip in the right mainstem bronchus. Recommend retracting the endotracheal tube 3.5 cm. These results were called by telephone at the time of interpretation on 03/20/2015 at 3:02 pm to [facility], who verbally acknowledged these results.   Electronically Signed   By: Elige Ko   On: 03/20/2015 15:02   Dg Chest Port 1 View  03/01/2015   CLINICAL DATA:  Acute onset of scrotal edema, shortness of breath and  chest pain. Initial encounter.  EXAM: PORTABLE CHEST - 1 VIEW  COMPARISON:  Chest radiograph performed 02/20/2015  FINDINGS: The lungs remain hypoexpanded. Vascular congestion and vascular crowding are noted. Patchy opacities within the right lung have improved. No pleural effusion or pneumothorax is seen.  The cardiomediastinal silhouette is borderline normal in size. No acute osseous abnormalities are identified.  IMPRESSION: Lungs hypoexpanded. Vascular congestion noted. Patchy opacities within the right lung have improved, which may reflect residual edema or pneumonia.   Electronically Signed   By: Roanna Raider M.D.   On: 03/01/2015 22:51    Microbiology: Recent Results (from the past 240 hour(s))  MRSA PCR Screening     Status: None   Collection Time: 03/20/15  1:29 PM  Result Value Ref Range Status   MRSA by PCR NEGATIVE NEGATIVE Final    Comment:        The GeneXpert MRSA Assay (FDA approved for NASAL specimens only), is one component of a comprehensive MRSA colonization surveillance program. It is not intended to diagnose MRSA infection nor to guide or monitor treatment for MRSA infections.   Culture, body fluid-bottle     Status: None   Collection Time: 03/22/15 12:12 PM  Result Value Ref Range Status   Specimen Description FLUID PERITONEAL CAVITY  Final   Special Requests AEB  Final   Culture NO GROWTH 5 DAYS  Final   Report Status 03/27/2015 FINAL  Final  Gram stain      Status: None   Collection Time: 03/22/15 12:12 PM  Result Value Ref Range Status   Specimen Description FLUID PERITONEAL CAVITY  Final   Special Requests NONE  Final   Gram Stain   Final    FEW WBC PRESENT, PREDOMINANTLY MONONUCLEAR NO ORGANISMS SEEN    Report Status 03/22/2015 FINAL  Final     Labs: Basic Metabolic Panel:  Recent Labs Lab 03/22/15 2204 03/23/15 0407 03/25/15 0550 03/26/15 0544 03/27/15 0315  NA 136 138 138 134* 136  K 3.9 3.6 3.7 3.4* 3.7  CL 108 110 111 108 108  CO2 20* 23 24 21* 21*  GLUCOSE 84 85 86 84 124*  BUN 7 6 7 8 9   CREATININE 1.04 0.99 0.97 0.99 1.09  CALCIUM 7.7* 8.0* 7.6* 7.3* 7.7*   Liver Function Tests:  Recent Labs Lab 03/23/15 0407  AST 43*  ALT 19  ALKPHOS 58  BILITOT 2.0*  PROT 6.1*  ALBUMIN 1.7*   No results for input(s): LIPASE, AMYLASE in the last 168 hours.  Recent Labs Lab 03/24/15 1007  AMMONIA 63*   CBC:  Recent Labs Lab 03/21/15 2345 03/23/15 0407 03/25/15 0550 03/26/15 0544 03/27/15 0315  WBC 9.5 7.1 7.8 8.1 9.0  NEUTROABS  --  3.0  --   --   --   HGB 8.0* 8.2* 9.0* 8.9* 9.4*  HCT 23.9* 24.9* 27.4* 26.2* 28.5*  MCV 81.0 82.5 84.6 83.2 85.1  PLT 65* 48* 61* 103* 57*   Cardiac Enzymes: No results for input(s): CKTOTAL, CKMB, CKMBINDEX, TROPONINI in the last 168 hours. BNP: BNP (last 3 results)  Recent Labs  02/06/15 1138  BNP 25.4    ProBNP (last 3 results) No results for input(s): PROBNP in the last 8760 hours.  CBG:  Recent Labs Lab 03/26/15 1633  GLUCAP 110*       Signed:  JOSEPH,PREETHA  [facility] 03/28/2015, 2:53 PM

## 2015-03-25 NOTE — Progress Notes (Signed)
Home Care Social Worker (SW) made visit with [facility] Liaison and family and Palliative Care Doctor for a family meeting.  Patient's Wife, Mother, Sister, and [facility] were all present.  Patient was alert and able to participate in visit and discuss his wishes which are to go home to die and to be comfortable.  He did get sick during visit and Hospital RN came in and provided some medication to help.  Patient's family is committed to keeping Patient at home and he is open to discussing [facility] if it becomes necessary.  SW will report to Primary team request for increase in RN visits and increase in Hospice Aide services.  SW will continue to follow and plan continues to be for discharge home with medically appropriate.    Maretta Los, LCSW [facility]  [phone]

## 2015-03-25 NOTE — Progress Notes (Signed)
Patient spoke with md about eating. MD advised me to give the patient a sandwich. Will continue to monitor.  Caraher, Charlaine Dalton RN

## 2015-03-25 NOTE — Progress Notes (Signed)
PATIENT REFUSED/REMOVED FOAM DRESSINGS COVERING STAGE 2 SORES TO BOTTOM.  ASSISTED PATIENT WITH TURNING IN BED.  PO MEDS REFUSED AT THIS TIME. PATIENT INSTRUCTED TO CALL IF AND WHEN HE THINKS HE CAN TOLERATE PO MEDICATIONS.

## 2015-03-25 NOTE — Care Management Note (Addendum)
Case Management Note  Patient Details  Name: Francisco [name] MRN: [id number] Date of Birth: [birth year]  Subjective/Objective:    Pt is an active home hospice pt admitted with GI bleed                Action/Plan:  Pt is active with [facility] for home hospice.   Pt wishes to return home post discharge in the care of his sister and mother residing in [place].  CM contacted [facility] liaison Chelsea Aus: verfied that pt is active with agency, start date of 03/05/15, hospice has assumed payment coverage for pt. [facility] is also following pt care in hospital.  Per Chelsea Aus; agency will continue to follow while in hospital then resume services post discharge.  CM will continue to monitor for disposition needs.   Expected Discharge Date:                  Expected Discharge Plan:  Home w Hospice Care  In-House Referral:  Hospice / Palliative Care  Discharge planning Services  CM Consult  Post Acute Care Choice:    Choice offered to:     DME Arranged:    DME Agency:     HH Arranged:    HH Agency:     Status of Service:  In process, will continue to follow  Medicare Important Message Given:    Date Medicare IM Given:    Medicare IM give by:    Date Additional Medicare IM Given:    Additional Medicare Important Message give by:     If discussed at Long Length of Stay Meetings, dates discussed:    Additional Comment 03/25/2015   Discharge plan Addendum:  Pt and family are now agreeble to full hospice service, pt is now a DNR and therefore will no longer need HRI status with [facility] per physician advisor.     Discussed in LOS 03/25/15; pt made HRI status per Physician Advisor.  Pt will also discharge home( possibly today) with [facility]. [facility] will provide DME for pt.  CSW will arrange transportation home.   Both agencies are following pt closely to determine discharge needs.  03/23/15 CM assessed pt.  Pt had increased work of breathing with a noticeably distended abdomen, asking when he could go home.  CM  verified with pt that his post discharge plan is to return home with hospice. Cherylann Parr, RN 03/25/2015, 11:34 AM

## 2015-03-25 NOTE — Progress Notes (Signed)
[facility] PROGRESS NOTE  Francisco [name] RUE:[id number] DOB: Aug 11, 1965 DOA: 03/20/2015 PCP: Pcp Not In System   PCCM Transfer 9/20 Brief narrative: 49 year old who presented with decompensated alcoholic cirrhosis and history of hep C, varices, ascites, and CRF under Hospice care. Status post admission in August 8/6 through 8/26 for SBP. Presented to the hospital secondary to hematemesis.  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 -decompensated cirrhosis with portal hypertension varices, ascites and encephalopathy -high Meld score of 24 on admission, now 14  -GI following, felt to have very poor prognosis and recommended Hospice -Followed by Hospice , but wanted Full Code and aggressive full scope of Rx, hence Palliative consulted, appreciate Dr.Anwars input, now DNR and decision for Home with hospice -not TIPS candidate per GI  Portal HTN with Ascites -status post paracentesis, no SBP, cultures NGTD  Ileus: -likely multifactorial, KuB unchanged -pt refused am laxatives, refuses NGT at thsi time, supportive care, liquid diet,  -laxative advised again, Kub in am  H/o Hepatic encephalopathy -on lactulose and xifamximin -mentation improving  Chronic diastolic CHF  Code Status: Full Family Communication: wife and mother at bedside Disposition Plan: home if tolerating PO, no further N/V   Consultants:  GI  Procedures:  Paracentesis and EGD  Antibiotics:  rifaximin  HPI/Subjective: Vomited this am, wants to go home  Objective: Filed Vitals:   03/25/15 1348  BP: 94/66  Pulse: 111  Temp: 98.3 F (36.8 C)  Resp: 18    Intake/Output Summary (Last 24 hours) at 03/25/15 1655 Last data filed at 03/25/15 1300  Gross per 24 hour  Intake    480 ml   Output    400 ml  Net     80 ml   Filed Weights   03/23/15 0431 03/24/15 0608 03/25/15 0848  Weight: 106 kg (233 lb 11 oz) 88 kg (194 lb 0.1 oz) 105.6 kg (232 lb 12.9 oz)    Exam:   General:  Patient in no acute distress, alert and awake, chronically ill appearing  Cardiovascular: Regular rate and rhythm, no rubs  Respiratory: No increased work of breathing, no wheezes  Abdomen: Distended, positive bowel sounds, nontender  Musculoskeletal: No cyanosis or clubbing   Data Reviewed: Basic Metabolic Panel:  Recent Labs Lab 03/21/15 1217 03/22/15 0500 03/22/15 2204 03/23/15 0407 03/25/15 0550  NA 135 134* 136 138 138  K 3.7 3.2* 3.9 3.6 3.7  CL 106 107 108 110 111  CO2 23 22 20* 23 24  GLUCOSE 87 87 84 85 86  BUN CREATININE 1.13 1.12 1.04 0.99 0.97  CALCIUM 7.6* 7.2* 7.7* 8.0* 7.6*  MG 1.9  --   --   --   --   PHOS 3.0  --   --   --   --    Liver Function Tests:  Recent Labs Lab 03/20/15 0848 03/23/15 0407  AST 64* 43*  ALT 23 19  ALKPHOS 75 58  BILITOT 2.4* 2.0*  PROT 7.6 6.1*  ALBUMIN 1.4* 1.7*    Recent Labs Lab 03/20/15 0848  LIPASE 25    Recent Labs Lab 03/20/15 0805 03/21/15 1218 03/24/15 1007  AMMONIA 48* 87* 63*   CBC:  Recent Labs Lab 03/20/15 0848  03/21/15 0044  03/21/15 1247 03/21/15 2345 03/23/15 0407 03/25/15 0550  WBC 9.3  < > 14.1* 12.9* 9.5 7.1 7.8  NEUTROABS 5.8  --   --   --   --  3.0  --   HGB 8.3*  < > 9.3* 8.6* 8.0* 8.2* 9.0*  HCT 24.9*  < > 27.7* 25.4* 23.9* 24.9* 27.4*  MCV 80.6  < > 81.2 80.9 81.0 82.5 84.6  PLT 114*  < > 72* 66* 65* 48* 61*  < > = values in this interval not displayed. Cardiac Enzymes: No results for input(s): CKTOTAL, CKMB, CKMBINDEX, TROPONINI in the last 168 hours. BNP (last 3 results)  Recent Labs  02/06/15 1138  BNP 25.4    ProBNP (last 3 results) No results for input(s): PROBNP in the last 8760 hours.  CBG:  Recent Labs Lab 03/20/15 1315  GLUCAP 103*     Recent Results (from the past 240 hour(s))  MRSA PCR Screening     Status: None   Collection Time: 03/20/15  1:29 PM  Result Value Ref Range Status   MRSA by PCR NEGATIVE NEGATIVE Final    Comment:        The GeneXpert MRSA Assay (FDA approved for NASAL specimens only), is one component of a comprehensive MRSA colonization surveillance program. It is not intended to diagnose MRSA infection nor to guide or monitor treatment for MRSA infections.   Culture, body fluid-bottle     Status: None (Preliminary result)   Collection Time: 03/22/15 12:12 PM  Result Value Ref Range Status   Specimen Description FLUID PERITONEAL CAVITY  Final   Special Requests AEB  Final   Culture NO GROWTH 3 DAYS  Final   Report Status PENDING  Incomplete  Gram stain     Status: None   Collection Time: 03/22/15 12:12 PM  Result Value Ref Range Status   Specimen Description FLUID PERITONEAL CAVITY  Final   Special Requests NONE  Final   Gram Stain   Final    FEW WBC PRESENT, PREDOMINANTLY MONONUCLEAR NO ORGANISMS SEEN    Report Status 03/22/2015 FINAL  Final     Studies: Dg Abd 1 View  03/25/2015   CLINICAL DATA:  Abdominal distension, ileus  EXAM: ABDOMEN - 1 VIEW  COMPARISON:  11/21/2014  FINDINGS: Again noted moderate gaseous distended small bowel loops suspicious for ileus or partial obstruction. There is some gas in proximal colon. Stool noted in descending colon.  IMPRESSION: Again noted moderate gaseous distended small bowel loops suspicious for ileus or partial obstruction. There is some gas in proximal colon. Stool noted in descending colon. No significant colonic distension.   Electronically Signed   By: Natasha Mead M.D.   On: 03/25/2015 10:06   Dg Abd 1 View  03/24/2015   CLINICAL DATA:  Nausea and vomiting for 2 days.  EXAM: ABDOMEN - 1 VIEW  COMPARISON:  03/20/2015  FINDINGS: The bowel loops are located along the central aspect of the abdomen and this is compatible with the known  abdominal ascites. Rectal tube or probe is present. Gas in the rectum. Mildly dilated loops of small bowel with some gas in the transverse colon. Limited evaluation for free air on these supine images.  IMPRESSION: Mildly dilated loops of small bowel with some gas in the colon. Findings are nonspecific but the differential would include an early ileus or obstructive process. Consider follow up imaging.   Electronically Signed   By: Richarda Overlie M.D.   On: 03/24/2015  16:57    Scheduled Meds: . sodium chloride   Intravenous Once  . antiseptic oral rinse  7 mL Mouth Rinse q12n4p  . antiseptic oral rinse  7 mL Mouth Rinse QID  . chlorhexidine  15 mL Mouth Rinse BID  . lactulose  30 g Oral TID  . pantoprazole (PROTONIX) IV  40 mg Intravenous Q12H  . polyethylene glycol  17 g Oral BID  . rifaximin  550 mg Oral BID  . sucralfate  1 g Oral TID WC & HS  . temazepam  7.5 mg Oral QHS   Continuous Infusions: . sodium chloride 10 mL/hr at 03/22/15 0830    Time spent:    JOSEPH,PREETHA  [facility] Pager [phone] If 7PM-7AM, please contact night-coverage at [email or web address], password [facility] 03/25/2015, 4:55 PM  LOS: 5 days

## 2015-03-26 ENCOUNTER — Inpatient Hospital Stay (HOSPITAL_COMMUNITY): Payer: Medicaid - Out of State

## 2015-03-26 DIAGNOSIS — K567 Ileus, unspecified: Secondary | ICD-10-CM | POA: Insufficient documentation

## 2015-03-26 LAB — CBC
HCT: 26.2 % — ABNORMAL LOW (ref 39.0–52.0)
Hemoglobin: 8.9 g/dL — ABNORMAL LOW (ref 13.0–17.0)
MCH: 28.3 pg (ref 26.0–34.0)
MCHC: 34 g/dL (ref 30.0–36.0)
MCV: 83.2 fL (ref 78.0–100.0)
PLATELETS: 103 10*3/uL — AB (ref 150–400)
RBC: 3.15 MIL/uL — ABNORMAL LOW (ref 4.22–5.81)
RDW: 17.6 % — AB (ref 11.5–15.5)
WBC: 8.1 10*3/uL (ref 4.0–10.5)

## 2015-03-26 LAB — BASIC METABOLIC PANEL WITH GFR
Anion gap: 5 (ref 5–15)
BUN: 8 mg/dL (ref 6–20)
CO2: 21 mmol/L — ABNORMAL LOW (ref 22–32)
Calcium: 7.3 mg/dL — ABNORMAL LOW (ref 8.9–10.3)
Chloride: 108 mmol/L (ref 101–111)
Creatinine, Ser: 0.99 mg/dL (ref 0.61–1.24)
GFR calc Af Amer: >60 mL/min
GFR calc non Af Amer: >60 mL/min
Glucose, Bld: 84 mg/dL (ref 65–99)
Potassium: 3.4 mmol/L — ABNORMAL LOW (ref 3.5–5.1)
Sodium: 134 mmol/L — ABNORMAL LOW (ref 135–145)

## 2015-03-26 LAB — GLUCOSE, CAPILLARY: Glucose-Capillary: 110 mg/dL — ABNORMAL HIGH (ref 65–99)

## 2015-03-26 MED ORDER — POTASSIUM CHLORIDE CRYS ER 20 MEQ PO TBCR
40.0000 meq | EXTENDED_RELEASE_TABLET | Freq: Once | ORAL | Status: AC
  Administered 2015-03-26: 40 meq via ORAL

## 2015-03-26 MED ORDER — MORPHINE SULFATE (PF) 2 MG/ML IV SOLN
1.0000 mg | Freq: Once | INTRAVENOUS | Status: AC
  Administered 2015-03-26: 1 mg via INTRAVENOUS
  Filled 2015-03-26: qty 1

## 2015-03-26 MED ORDER — BISACODYL 10 MG RE SUPP
10.0000 mg | Freq: Once | RECTAL | Status: AC
  Administered 2015-03-26: 10 mg via RECTAL
  Filled 2015-03-26: qty 1

## 2015-03-26 MED ORDER — MORPHINE SULFATE (PF) 2 MG/ML IV SOLN
1.0000 mg | INTRAVENOUS | Status: DC | PRN
  Administered 2015-03-26 – 2015-03-28 (×10): 1 mg via INTRAVENOUS
  Filled 2015-03-26 (×11): qty 1

## 2015-03-26 MED ORDER — POTASSIUM CHLORIDE 10 MEQ/100ML IV SOLN
10.0000 meq | INTRAVENOUS | Status: DC
  Filled 2015-03-26: qty 100

## 2015-03-26 MED ORDER — POTASSIUM CHLORIDE CRYS ER 10 MEQ PO TBCR
EXTENDED_RELEASE_TABLET | ORAL | Status: AC
  Filled 2015-03-26: qty 4

## 2015-03-26 NOTE — Progress Notes (Signed)
No bowel sounds heard upon auscultation.  Dr. Jomarie Longs assessing pt as well.  Pt c/o generalized pain and wanting to eat.  Explained to patient risks of eating with an illeus.  Pt going for abd xray at this time.  Expressed to Dr. Jomarie Longs that if pt's xray still shows illeus, I will not feel comfortable giving anything PO.  She states, "will look at xray and decide from there".

## 2015-03-26 NOTE — Progress Notes (Addendum)
Inpatient [facility] 57M 11 [facility] RN visit.   This is a related admission to [facility] DX of alcoholic cirrhosis of the liver and esophagel varices with bleeding. Patient is a DNR.  Patient sound asleep when writer entered room. He did not awaken to his name. Resps regular and unlabored. Writer notified by [facility] that pt. would not d/c home today due to continued nausea and vomiting. Pt. Has received 16 mg of Zofran IV in the last 24 hours. No family at the bedside. [facility] will continue to follow daily. Please call with any questions.  Sharen Heck RN [facility] Liaison [phone]

## 2015-03-26 NOTE — Progress Notes (Signed)
[facility] PROGRESS NOTE  Francisco [name] ZOX:[id number] DOB: 14-Dec-1965 DOA: 03/20/2015 PCP: Pcp Not In System   PCCM Transfer 9/20 Brief narrative: 49 year old who presented with decompensated alcoholic cirrhosis and history of hep C, varices, ascites, and CRF under Hospice care. Status post admission in August 8/6 through 8/26 for SBP. Presented to the hospital secondary to hematemesis.  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 -decompensated cirrhosis with portal hypertension varices, ascites and encephalopathy -high Meld score of 24 on admission, now 14  -GI following, felt to have very poor prognosis and recommended Hospice -Followed by Hospice , but wanted Full Code and aggressive full scope of Rx, hence Palliative consulted, appreciate Dr.Anwars input, now DNR and decision for Home with hospice -not TIPS candidate per GI  Pancytopenia  -due to cirrhosis  Portal HTN with Ascites -status post paracentesis, no SBP, cultures NGTD  H/o Hepatic encephalopathy -on lactulose and xifamximin -mentation improved  Chronic diastolic CHF -resume diuretics when BP tolerates   DVT proph: SCDs  Code Status: Full Family Communication: updated wife and wife and mother 9/22 Disposition Plan: home when tolerating PO, no further N/V   Consultants:  GI  Procedures:  Paracentesis and EGD  Antibiotics:  rifaximin  HPI/Subjective: Didn't vomit this am, wants to go home  Objective: Filed Vitals:   03/26/15 0454  BP: 95/61  Pulse: 111  Temp: 98.9 F (37.2 C)  Resp: 18    Intake/Output Summary (Last 24 hours) at  03/26/15 1315 Last data filed at 03/26/15 1300  Gross per 24 hour  Intake    720 ml  Output      0 ml  Net    720 ml   Filed Weights   03/24/15 0608 03/25/15 0848 03/26/15 0454  Weight: 88 kg (194 lb 0.1 oz) 105.6 kg (232 lb 12.9 oz) 106.7 kg (235 lb 3.7 oz)    Exam:   General:  Patient in no acute distress, alert and awake, chronically ill appearing  Cardiovascular: Regular rate and rhythm, no rubs  Respiratory: No increased work of breathing, no wheezes  Abdomen: Distended, diminished bowel sounds, nontender  Musculoskeletal: No cyanosis or clubbing   Data Reviewed: Basic Metabolic Panel:  Recent Labs Lab 03/21/15 1217 03/22/15 0500 03/22/15 2204 03/23/15 0407 03/25/15 0550 03/26/15 0544  NA 135 134* 136 138 138 134*  K 3.7 3.2* 3.9 3.6 3.7 3.4*  CL 106 107 108 110 111 108  CO2 23 22 20* 23 24 21*  GLUCOSE 87 87 84 85 86 84  BUN CREATININE 1.13 1.12 1.04 0.99 0.97 0.99  CALCIUM 7.6* 7.2* 7.7* 8.0* 7.6* 7.3*  MG 1.9  --   --   --   --   --   PHOS 3.0  --   --   --   --   --    Liver Function Tests:  Recent Labs Lab 03/20/15 0848 03/23/15 0407  AST 64* 43*  ALT 23 19  ALKPHOS 75 58  BILITOT 2.4* 2.0*  PROT 7.6 6.1*  ALBUMIN 1.4* 1.7*    Recent Labs Lab 03/20/15 0848  LIPASE  25    Recent Labs Lab 03/20/15 0805 03/21/15 1218 03/24/15 1007  AMMONIA 48* 87* 63*   CBC:  Recent Labs Lab 03/20/15 0848  03/21/15 1247 03/21/15 2345 03/23/15 0407 03/25/15 0550 03/26/15 0544  WBC 9.3  < > 12.9* 9.5 7.1 7.8 8.1  NEUTROABS 5.8  --   --   --  3.0  --   --   HGB 8.3*  < > 8.6* 8.0* 8.2* 9.0* 8.9*  HCT 24.9*  < > 25.4* 23.9* 24.9* 27.4* 26.2*  MCV 80.6  < > 80.9 81.0 82.5 84.6 83.2  PLT 114*  < > 66* 65* 48* 61* 103*  < > = values in this interval not displayed. Cardiac Enzymes: No results for input(s): CKTOTAL, CKMB, CKMBINDEX, TROPONINI in the last 168 hours. BNP (last 3 results)  Recent Labs  02/06/15 1138  BNP 25.4     ProBNP (last 3 results) No results for input(s): PROBNP in the last 8760 hours.  CBG:  Recent Labs Lab 03/20/15 1315  GLUCAP 103*    Recent Results (from the past 240 hour(s))  MRSA PCR Screening     Status: None   Collection Time: 03/20/15  1:29 PM  Result Value Ref Range Status   MRSA by PCR NEGATIVE NEGATIVE Final    Comment:        The GeneXpert MRSA Assay (FDA approved for NASAL specimens only), is one component of a comprehensive MRSA colonization surveillance program. It is not intended to diagnose MRSA infection nor to guide or monitor treatment for MRSA infections.   Culture, body fluid-bottle     Status: None (Preliminary result)   Collection Time: 03/22/15 12:12 PM  Result Value Ref Range Status   Specimen Description FLUID PERITONEAL CAVITY  Final   Special Requests AEB  Final   Culture NO GROWTH 3 DAYS  Final   Report Status PENDING  Incomplete  Gram stain     Status: None   Collection Time: 03/22/15 12:12 PM  Result Value Ref Range Status   Specimen Description FLUID PERITONEAL CAVITY  Final   Special Requests NONE  Final   Gram Stain   Final    FEW WBC PRESENT, PREDOMINANTLY MONONUCLEAR NO ORGANISMS SEEN    Report Status 03/22/2015 FINAL  Final     Studies: Dg Abd 1 View  03/26/2015   CLINICAL DATA:  Abdominal distention.  EXAM: ABDOMEN - 1 VIEW  COMPARISON:  03/25/2015  FINDINGS: There is slight improvement in the degree of distention of small bowel loops, seen in the central of the abdomen, with relative paucity of gas peripherally, consistent with the presence of large volume ascites. Colonic bowel gas pattern is normal where seen.  IMPRESSION: Slight improvement in the degree of small bowel dilation, likely representing improving ileus.  Large volume abdominal ascites.   Electronically Signed   By: Ted Mcalpine M.D.   On: 03/26/2015 08:51   Dg Abd 1 View  03/25/2015   CLINICAL DATA:  Abdominal distension, ileus  EXAM: ABDOMEN - 1  VIEW  COMPARISON:  11/21/2014  FINDINGS: Again noted moderate gaseous distended small bowel loops suspicious for ileus or partial obstruction. There is some gas in proximal colon. Stool noted in descending colon.  IMPRESSION: Again noted moderate gaseous distended small bowel loops suspicious for ileus or partial obstruction. There is some gas in proximal colon. Stool noted in descending colon. No significant colonic distension.   Electronically Signed   By: Lanette Hampshire.D.  On: 03/25/2015 10:06   Dg Abd 1 View  03/24/2015   CLINICAL DATA:  Nausea and vomiting for 2 days.  EXAM: ABDOMEN - 1 VIEW  COMPARISON:  03/20/2015  FINDINGS: The bowel loops are located along the central aspect of the abdomen and this is compatible with the known abdominal ascites. Rectal tube or probe is present. Gas in the rectum. Mildly dilated loops of small bowel with some gas in the transverse colon. Limited evaluation for free air on these supine images.  IMPRESSION: Mildly dilated loops of small bowel with some gas in the colon. Findings are nonspecific but the differential would include an early ileus or obstructive process. Consider follow up imaging.   Electronically Signed   By: Richarda Overlie M.D.   On: 03/24/2015 16:57    Scheduled Meds: . chlorhexidine  15 mL Mouth Rinse BID  . lactulose  30 g Oral TID  . pantoprazole (PROTONIX) IV  40 mg Intravenous Q12H  . polyethylene glycol  17 g Oral BID  . rifaximin  550 mg Oral BID  . temazepam  7.5 mg Oral QHS   Continuous Infusions: . sodium chloride 10 mL/hr at 03/22/15 0830    Time spent:    Francisco [name]  [facility] Pager [phone] If 7PM-7AM, please contact night-coverage at [email or web address], password [facility] 03/26/2015, 1:15 PM  LOS: 6 days

## 2015-03-26 NOTE — Progress Notes (Signed)
ON-CALL NOTIFIED OF PATIENT'S REQUEST FOR PAIN MEDICATION.

## 2015-03-26 NOTE — Progress Notes (Signed)
Home Care SW made visit with Patient, his Mother and Wife were present.  Patient stated he was wanting to go home and wanted to have something to drink.  RN on floor was going to see about getting him something to drink.  SW talked to them about his Medicaid and his Mother stated she has turned all the requested paperwork in and they were waiting to hear from [facility].  SW offered support during visit and will continue to follow.    Maretta Los, LCSW [facility] [place] [phone]

## 2015-03-26 NOTE — Progress Notes (Signed)
Daily Progress Note   Patient Name: Francisco [name]       Date: 03/26/2015 DOB: 1966/03/10  Age: 49 y.o. MRN#: [id number] Attending Physician: Zannie Cove, MD Primary Care Physician: Pcp Not In System Admit Date: 03/20/2015  Reason for Consultation/Follow-up: Establishing goals of care Life limiting illness: sequelae of end stage liver disease.  Subjective:  awake alert, resting in bed, adamant about going home soon.   Interval Events: Plain film X rays noted, resolving ileus, large ascites. Discussed with patient, mother and wife. Bedside RN administering Dulcolax suppository, patient passed small amount of gas.  Discussed about safe discharge, discussed about not going home till ileus resolved. Discussed patient's overall declining condition.  Length of Stay: 6 days  Current Medications: Scheduled Meds:  . antiseptic oral rinse  7 mL Mouth Rinse q12n4p  . antiseptic oral rinse  7 mL Mouth Rinse QID  . chlorhexidine  15 mL Mouth Rinse BID  . lactulose  30 g Oral TID  . pantoprazole (PROTONIX) IV  40 mg Intravenous Q12H  . polyethylene glycol  17 g Oral BID  . rifaximin  550 mg Oral BID  . temazepam  7.5 mg Oral QHS    Continuous Infusions: . sodium chloride 10 mL/hr at 03/22/15 0830    PRN Meds: morphine injection, ondansetron (ZOFRAN) IV, ondansetron  Palliative Performance Scale: 30%     Vital Signs: BP 95/61 mmHg  Pulse 111  Temp(Src) 98.9 F (37.2 C) (Oral)  Resp 18  Ht  (1.778 m)  Wt 106.7 kg (235 lb 3.7 oz)  BMI 33.75 kg/m2  SpO2 99% SpO2: SpO2: 99 % O2 Device: O2 Device: Not Delivered O2 Flow Rate: O2 Flow Rate (L/min): 2 L/min  Intake/output summary:  Intake/Output Summary (Last 24 hours) at 03/26/15 1211 Last data filed at 03/26/15 1000  Gross per 24 hour  Intake    600 ml  Output      0 ml  Net    600 ml   LBM:   Baseline Weight: Weight: 106.9 kg (235 lb 10.8 oz) Most recent weight: Weight: 106.7 kg (235 lb 3.7 oz)  Physical  Exam: Distended abdomen Awake alert Clear No edema Non focal S1S2       Additional Data Reviewed: Recent Labs     03/25/15  0550  03/26/15  0544  WBC  7.8  8.1  HGB  9.0*  8.9*  PLT  61*  103*  NA  138  134*  BUN  7  8  CREATININE  0.97  0.99     Problem List:  Patient Active Problem List   Diagnosis Date Noted  . Vomiting   . Encounter for palliative care   . Endotracheally intubated   . Alcoholic cirrhosis of liver with ascites   . Erosive esophagitis   . Portal hypertension   . End stage liver disease   . Coagulopathy   . GI bleed 03/20/2015  . Upper GI bleed   . Hypotension 03/10/2015  . Candidiasis of perineum 03/10/2015  . Anasarca 03/10/2015  . NSVT (nonsustained ventricular tachycardia) 02/24/2015  . Esophageal varices 02/24/2015  . Severe sepsis with septic shock 02/24/2015  . Palliative care encounter 02/24/2015  . Weakness generalized 02/24/2015  . DNR (do not resuscitate) discussion 02/24/2015  . Abnormal echocardiogram 02/20/2015  . Acute respiratory failure with hypoxia 02/13/2015  . Acute encephalopathy   . Ascites   . Encephalopathy, hepatic   . Acute upper GI bleed   . Cirrhosis   .  Acute blood loss anemia      Palliative Care Assessment & Plan    Code Status:  DNR  Goals of Care:   home with [facility] when stable  Desire for further Chaplaincy support:no  3. Symptom Management:   continue current symptom management.   4. Palliative Prophylaxis:  Stool Softner: yes  5. Prognosis: < 2 weeks  5. Discharge Planning: Home with Hospice   Care plan was discussed with  Patient, wife, mother, bedside RN  Thank you for allowing the Palliative Medicine Team to assist in the care of this patient.   Time In: 1000 Time Out: 1025 Total Time 25 Prolonged Time Billed  no     Greater than 50%  of this time was spent counseling and coordinating care related to the above assessment and plan.  Zeba Gwenlyn Saran, MD  03/26/2015,  12:11 PM  [phone]  Please contact Palliative Medicine Team phone at [phone] for questions and concerns.

## 2015-03-26 NOTE — Care Management Note (Addendum)
Case Management Note  Patient Details  Name: Francisco [name] MRN: [id number] Date of Birth: [birth year]  Subjective/Objective:    Pt is an active home hospice pt admitted with GI bleed                Action/Plan:  Pt is active with [facility] for home hospice.   Pt wishes to return home post discharge in the care of his sister and mother residing in [place].  CM contacted [facility] liaison Chelsea Aus: verfied that pt is active with agency, start date of 03/05/15, hospice has assumed payment coverage for pt. [facility] is also following pt care in hospital.  Per Chelsea Aus; agency will continue to follow while in hospital then resume services post discharge.  CM will continue to monitor for disposition needs.   Expected Discharge Date:                  Expected Discharge Plan:  Home w Hospice Care  In-House Referral:  Hospice / Palliative Care  Discharge planning Services  CM Consult  Post Acute Care Choice:    Choice offered to:     DME Arranged:    DME Agency:     HH Arranged:    HH Agency:     Status of Service:  In process, will continue to follow  Medicare Important Message Given:    Date Medicare IM Given:    Medicare IM give by:    Date Additional Medicare IM Given:    Additional Medicare Important Message give by:     If discussed at Long Length of Stay Meetings, dates discussed:    Additional Comment 03/26/2015 Pt has confirmed ileus, pt will be advanced to clear liquids today as tolerated.  CM will continue to monitor.  [facility] contacted and informed of status.  03/26/2015   Discharge plan Addendum:  Pt and family are now agreeble to full hospice service, pt is now a DNR and therefore will no longer need HRI status with [facility] per physician advisor.     Discussed in LOS 03/25/15; pt made HRI status per Physician Advisor.  Pt will also discharge home( possibly today) with [facility]. [facility] will provide DME for pt.  CSW will arrange transportation home.   Both agencies are following pt closely to  determine discharge needs.  03/23/15 CM assessed pt.  Pt had increased work of breathing with a noticeably distended abdomen, asking when he could go home.  CM verified with pt that his post discharge plan is to return home with hospice. Cherylann Parr, RN 03/26/2015, 12:41 PM

## 2015-03-26 NOTE — Progress Notes (Signed)
UR Completed. Samantha Claxton, RN, BSN.  [phone] 

## 2015-03-27 ENCOUNTER — Inpatient Hospital Stay (HOSPITAL_COMMUNITY): Payer: Medicaid - Out of State

## 2015-03-27 LAB — CBC
HCT: 28.5 % — ABNORMAL LOW (ref 39.0–52.0)
Hemoglobin: 9.4 g/dL — ABNORMAL LOW (ref 13.0–17.0)
MCH: 28.1 pg (ref 26.0–34.0)
MCHC: 33 g/dL (ref 30.0–36.0)
MCV: 85.1 fL (ref 78.0–100.0)
PLATELETS: 57 10*3/uL — AB (ref 150–400)
RBC: 3.35 MIL/uL — AB (ref 4.22–5.81)
RDW: 17.7 % — ABNORMAL HIGH (ref 11.5–15.5)
WBC: 9 10*3/uL (ref 4.0–10.5)

## 2015-03-27 LAB — BASIC METABOLIC PANEL
Anion gap: 7 (ref 5–15)
BUN: 9 mg/dL (ref 6–20)
CO2: 21 mmol/L — AB (ref 22–32)
Calcium: 7.7 mg/dL — ABNORMAL LOW (ref 8.9–10.3)
Chloride: 108 mmol/L (ref 101–111)
Creatinine, Ser: 1.09 mg/dL (ref 0.61–1.24)
GFR calc Af Amer: >60 mL/min (ref >60)
GLUCOSE: 124 mg/dL — AB (ref 65–99)
POTASSIUM: 3.7 mmol/L (ref 3.5–5.1)
Sodium: 136 mmol/L (ref 135–145)

## 2015-03-27 LAB — CULTURE, BODY FLUID W GRAM STAIN -BOTTLE

## 2015-03-27 LAB — CULTURE, BODY FLUID-BOTTLE: CULTURE: NO GROWTH

## 2015-03-27 MED ORDER — BISACODYL 10 MG RE SUPP
10.0000 mg | Freq: Once | RECTAL | Status: AC
Start: 2015-03-27 — End: 2015-03-27
  Administered 2015-03-27: 10 mg via RECTAL
  Filled 2015-03-27: qty 1

## 2015-03-27 MED ORDER — LACTULOSE 10 GM/15ML PO SOLN
45.0000 g | Freq: Three times a day (TID) | ORAL | Status: DC
  Administered 2015-03-27 – 2015-03-28 (×3): 45 g via ORAL
  Filled 2015-03-27 (×4): qty 90

## 2015-03-27 MED ORDER — LIDOCAINE HCL (PF) 1 % IJ SOLN
INTRAMUSCULAR | Status: AC
  Filled 2015-03-27: qty 10

## 2015-03-27 NOTE — Progress Notes (Signed)
[facility] PROGRESS NOTE  Francisco [name] NUU:[id number] DOB: April 24, 1966 DOA: 03/20/2015 PCP: Pcp Not In System   PCCM Transfer 9/20 Brief narrative: 49 year old who presented with decompensated alcoholic cirrhosis and history of hep C, varices, ascites, and CRF under Hospice care. Status post admission in August 8/6 through 8/26 for SBP. Presented to the hospital secondary to hematemesis.  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 -decompensated cirrhosis with portal hypertension varices, ascites and encephalopathy -high Meld score of 24 on admission, now 14  -GI following, felt to have very poor prognosis and recommended Hospice -Followed by Hospice , but wanted Full Code and aggressive full scope of Rx, hence Palliative consulted, appreciate Dr.Anwars input, now DNR and decision for Home with hospice -not TIPS candidate per GI  Pancytopenia  -due to cirrhosis  Portal HTN with Ascites -status post paracentesis, no SBP, cultures NGTD  H/o Hepatic encephalopathy -on lactulose and xifamximin -mentation improved  Chronic diastolic CHF -resume diuretics when BP tolerates   DVT proph: SCDs  Code Status: Full Family Communication: updated wife and wife and mother 9/22 Disposition Plan: home when tolerating PO, no further N/V   Consultants:  GI  Procedures:  Paracentesis and EGD  Antibiotics:  rifaximin  HPI/Subjective: Awaiting paracentesis  Objective: Filed Vitals:   03/27/15 1054  BP: 94/67  Pulse:   Temp:   Resp:     Intake/Output Summary (Last 24 hours) at 03/27/15 1330 Last data filed at  03/27/15 1115  Gross per 24 hour  Intake    240 ml  Output   4200 ml  Net  -3960 ml   Filed Weights   03/25/15 0848 03/26/15 0454 03/27/15 0322  Weight: 105.6 kg (232 lb 12.9 oz) 106.7 kg (235 lb 3.7 oz) 106.9 kg (235 lb 10.8 oz)    Exam:   General:  Patient in no acute distress, alert and awake, chronically ill appearing  Cardiovascular: Regular rate and rhythm, no rubs  Respiratory: No increased work of breathing, no wheezes  Abdomen: Distended, diminished bowel sounds, nontender  Musculoskeletal: No cyanosis or clubbing   Data Reviewed: Basic Metabolic Panel:  Recent Labs Lab 03/21/15 1217  03/22/15 2204 03/23/15 0407 03/25/15 0550 03/26/15 0544 03/27/15 0315  NA 135  < > 136 138 138 134* 136  K 3.7  < > 3.9 3.6 3.7 3.4* 3.7  CL 106  < > 108 110 111 108 108  CO2 23  < > 20* 23 24 21* 21*  GLUCOSE 87  < > 84 85 86 84 124*  BUN 6  < > CREATININE 1.13  < > 1.04 0.99 0.97 0.99 1.09  CALCIUM 7.6*  < > 7.7* 8.0* 7.6* 7.3* 7.7*  MG 1.9  --   --   --   --   --   --   PHOS 3.0  --   --   --   --   --   --   < > = values in this interval not displayed. Liver Function Tests:  Recent Labs Lab 03/23/15 0407  AST 43*  ALT 19  ALKPHOS 58  BILITOT  2.0*  PROT 6.1*  ALBUMIN 1.7*   No results for input(s): LIPASE, AMYLASE in the last 168 hours.  Recent Labs Lab 03/21/15 1218 03/24/15 1007  AMMONIA 87* 63*   CBC:  Recent Labs Lab 03/21/15 2345 03/23/15 0407 03/25/15 0550 03/26/15 0544 03/27/15 0315  WBC 9.5 7.1 7.8 8.1 9.0  NEUTROABS  --  3.0  --   --   --   HGB 8.0* 8.2* 9.0* 8.9* 9.4*  HCT 23.9* 24.9* 27.4* 26.2* 28.5*  MCV 81.0 82.5 84.6 83.2 85.1  PLT 65* 48* 61* 103* 57*   Cardiac Enzymes: No results for input(s): CKTOTAL, CKMB, CKMBINDEX, TROPONINI in the last 168 hours. BNP (last 3 results)  Recent Labs  02/06/15 1138  BNP 25.4    ProBNP (last 3 results) No results for input(s): PROBNP in the last 8760  hours.  CBG:  Recent Labs Lab 03/26/15 1633  GLUCAP 110*    Recent Results (from the past 240 hour(s))  MRSA PCR Screening     Status: None   Collection Time: 03/20/15  1:29 PM  Result Value Ref Range Status   MRSA by PCR NEGATIVE NEGATIVE Final    Comment:        The GeneXpert MRSA Assay (FDA approved for NASAL specimens only), is one component of a comprehensive MRSA colonization surveillance program. It is not intended to diagnose MRSA infection nor to guide or monitor treatment for MRSA infections.   Culture, body fluid-bottle     Status: None   Collection Time: 03/22/15 12:12 PM  Result Value Ref Range Status   Specimen Description FLUID PERITONEAL CAVITY  Final   Special Requests AEB  Final   Culture NO GROWTH 5 DAYS  Final   Report Status 03/27/2015 FINAL  Final  Gram stain     Status: None   Collection Time: 03/22/15 12:12 PM  Result Value Ref Range Status   Specimen Description FLUID PERITONEAL CAVITY  Final   Special Requests NONE  Final   Gram Stain   Final    FEW WBC PRESENT, PREDOMINANTLY MONONUCLEAR NO ORGANISMS SEEN    Report Status 03/22/2015 FINAL  Final     Studies: Dg Abd 1 View  03/27/2015   CLINICAL DATA:  Ileus.  EXAM: ABDOMEN - 1 VIEW  COMPARISON:  03/26/2015  FINDINGS: Limited evaluation for free air given supine technique. Mild gaseous distension of multiple small bowel loops is stable to slightly increased from the prior study. There is a moderate amount of stool in the left colon. Increased density peripherally in the abdomen likely reflects ascites.  IMPRESSION: Stable to slightly increased gaseous distention of small bowel which may reflect ileus.   Electronically Signed   By: Sebastian Ache M.D.   On: 03/27/2015 09:20   Dg Abd 1 View  03/26/2015   CLINICAL DATA:  Abdominal distention.  EXAM: ABDOMEN - 1 VIEW  COMPARISON:  03/25/2015  FINDINGS: There is slight improvement in the degree of distention of small bowel loops, seen in the  central of the abdomen, with relative paucity of gas peripherally, consistent with the presence of large volume ascites. Colonic bowel gas pattern is normal where seen.  IMPRESSION: Slight improvement in the degree of small bowel dilation, likely representing improving ileus.  Large volume abdominal ascites.   Electronically Signed   By: Ted Mcalpine M.D.   On: 03/26/2015 08:51   US Paracentesis  03/27/2015   CLINICAL DATA:  End-stage liver disease with recurrent ascites. Request  therapeutic paracentesis of up to 4 L.  EXAM: ULTRASOUND GUIDED PARACENTESIS  COMPARISON:  Previous paracentesis  PROCEDURE: An ultrasound guided paracentesis was thoroughly discussed with the patient and questions answered. The benefits, risks, alternatives and complications were also discussed. The patient understands and wishes to proceed with the procedure. Written consent was obtained.  Ultrasound was performed to localize and mark an adequate pocket of fluid in the right lower quadrant of the abdomen. The area was then prepped and draped in the normal sterile fashion. 1% Lidocaine was used for local anesthesia. Under ultrasound guidance a Safe-T-Centesis catheter was introduced. Paracentesis was performed. The catheter was removed and a dressing applied.  COMPLICATIONS: None immediate  FINDINGS: A total of approximately 4 L of clear, amber colored fluid was removed. A fluid sample was not sent for laboratory analysis.  IMPRESSION: Successful ultrasound guided paracentesis yielding 4 L of ascites.  Read by: Brayton El PA-C   Electronically Signed   By: Gilmer Mor D.O.   On: 03/27/2015 11:08    Scheduled Meds: . bisacodyl  10 mg Rectal Once  . chlorhexidine  15 mL Mouth Rinse BID  . lactulose  45 g Oral TID  . lidocaine (PF)      . pantoprazole (PROTONIX) IV  40 mg Intravenous Q12H  . polyethylene glycol  17 g Oral BID  . rifaximin  550 mg Oral BID  . temazepam  7.5 mg Oral QHS   Continuous Infusions: .  sodium chloride 10 mL/hr at 03/22/15 0830    Time spent:    JOSEPH,PREETHA  [facility] Pager [phone] If 7PM-7AM, please contact night-coverage at [email or web address], password [facility] 03/27/2015, 1:30 PM  LOS: 7 days

## 2015-03-27 NOTE — Progress Notes (Signed)
Inpatient [facility] 2W-14 [facility] RN visit-Stacie Meredith Mody RN, BSN   This is a related admission to [facility] DX of alcoholic cirrhosis of the liver and esophagel varices with bleeding. Patient is a DNR. Patient seen in room laying in bed. No family at the bedside. Patient c/o abdominal discomfort and stated he is ready for another paracentesis.  He verbalized desire for F/C to be removed.  He denies pain just states his stomach is "uncomfortable."  Breakfast tray at bedside untouched.  He is on RA and respirations are WNL. He has received 3 doses of  Morphine in the past 24 hours and 1  dose of Zofran.  Patient denied any additional DME needed upon discharge.  Offered emotional support.  [facility] will continue to follow daily. Please call with any questions.  Chelsea Aus RN, BSN [facility] Liaison [phone]

## 2015-03-27 NOTE — Procedures (Signed)
Successful US guided paracentesis from RLQ.  Yielded 4L of clear, amber colored fluid.  No immediate complications.  Pt tolerated well.   Specimen was not sent for labs.  Brayton El PA-C 03/27/2015 11:08 AM

## 2015-03-28 ENCOUNTER — Inpatient Hospital Stay (HOSPITAL_COMMUNITY): Payer: Medicaid - Out of State

## 2015-03-28 MED ORDER — PANTOPRAZOLE SODIUM 40 MG PO TBEC: 40.0000 mg | DELAYED_RELEASE_TABLET | Freq: Two times a day (BID) | ORAL | Status: AC

## 2015-03-28 MED ORDER — ONDANSETRON 4 MG PO TBDP: 4.0000 mg | ORAL_TABLET | Freq: Three times a day (TID) | ORAL | Status: DC | PRN

## 2015-03-28 MED ORDER — POLYETHYLENE GLYCOL 3350 17 G PO PACK: 17.0000 g | PACK | Freq: Two times a day (BID) | ORAL | Status: AC

## 2015-03-28 MED ORDER — ONDANSETRON 4 MG PO TBDP: 4.0000 mg | ORAL_TABLET | Freq: Four times a day (QID) | ORAL | Status: AC | PRN

## 2015-03-28 MED ORDER — OXYCODONE HCL 5 MG PO TABS: 5.0000 mg | ORAL_TABLET | ORAL | Status: AC | PRN

## 2015-03-28 NOTE — Progress Notes (Signed)
[facility] W/E SW visit - Forrestine Him, LCSW  This is [facility] related admission, diagnosis alcoholic cirrhosis of the liver and esophageal varices and bleeding. Code Status is DNR.  Patient seen in room laying in bed sleeping. No family present. Patient woke easily, answered a few questions and drifted back to sleep. He reports no pain and no needs at this time. Per chart review, he is on RA and respirations within normal limits. He received 3 doses of 1 mg morphine today. Note paracentesis 03/27/15 yielded 4L. Note plan for home when tolerating POs and no further N/V.  [facility] will continue to follow daily. Please contact [facility] for hospice needs at [phone].  Thank you.  Forrestine Him, LCSW [phone]

## 2015-03-28 NOTE — Progress Notes (Signed)
CSW spoke to patient's nurse Kathlene November- d/c is planned for later this afternoon for home with Hospice.  Per discussion with RN- EMS arranged for 6 pm pick up.  Notified patient's wife- Fleet Contras of above who stated that she would be home to await the EMS personnel.  EMS transport forms completed and will be placed with chart. CSW will notify patient of d/c plan.  No further CSW needs identified.  CSW signing off. Lorri Frederick. Jaci Lazier, [place] [phone]  (weekend coverage)

## 2015-03-28 NOTE — Discharge Summary (Addendum)
Physician Discharge Summary  [name] KGM:[id number] DOB: 01-17-1966 DOA: 03/20/2015  PCP: Pcp Not In System  Admit date: 03/20/2015 Discharge date: 03/28/2015  Time spent: 45 minutes  Recommendations for Outpatient Follow-up:  Home with Hospice for End of life care, patient open to transferring to [facility] when he deteriorates further at home and cannot be managed  Discharge Diagnoses:  Active Problems:   Acute respiratory failure with hypoxia   GI bleed   Upper GI bleed   Endotracheally intubated   End stage Liver disease   Alcoholic cirrhosis of liver with ascites   Erosive esophagitis   Portal hypertension   End stage liver disease   Coagulopathy   Vomiting   Encounter for palliative care   Ileus   Urinary retention  Discharge Condition: poor  Diet recommendation: liquid diet, advance as tolerated  Filed Weights   03/26/15 0454 03/27/15 0322 03/28/15 0416  Weight: 106.7 kg (235 lb 3.7 oz) 106.9 kg (235 lb 10.8 oz) 104.1 kg (229 lb 8 oz)    History of present illness:  49 yo male with hx ETOH cirrhosis, varices, chronic renal failure, ascites with recent admit (august) for sepsis r/t SBP requiring elective intubation for endoscopy during that admission. Returned 9/17 with hematemesis and melena   Hospital Course:   Upper GI bleed -had melena and hematemesis on admission, was electively intubated and admitted to ICU -was seen by [facility] and underwent an EGD which showed severe esophagitis, h/o Varices and h/o Banding -was Transfused 2 units PRBC and 3 units FFP this admission. -no heavy bleeding since then -not TIPS candidate per GI  Acute blood loss anemia -s/p 2 units PRBC this admission, Hb stable since then  End stage Liver disease -due to ETOH and Hep C -decompensated cirrhosis with portal hypertension varices, ascites and encephalopathy -high Meld score of 24 on admission, now 14  -Seen by GI , felt to have very poor prognosis and recommended  Hospice -Followed by Hospice prior to admission, but wanted Full Code and aggressive full scope of Rx, hence Palliative was consulted, had a family meeting on 9/22 and decision made for DNR, and patient wants to go home with Hospice, he understands he's dying. -he is adamant to go home with Hospice and is agreeable to consider [facility] if he could not be managed at Home  Portal HTN with Ascites -status post paracentesis, no SBP, cultures NGTD -repeat Paracentesis 9/24 for symptom control  Ileus: -multifactorial, had multiple episodes of emesis 9/22 and 9/23 , Kub with ileus, refuses NG decompression, now tolerating clears and full liquids, had a BM last night and feels well enough that he can manage his symptoms at home.  H/o Hepatic encephalopathy -on lactulose and xifaximin -mentation improved  Urinary retention -foley replaced, continue foley care per Hospice RN  Chronic diastolic CHF -continue PO lasix  Consultations:  Palliative care  Discharge Exam: Filed Vitals:   03/28/15 1442  BP: 102/80  Pulse: 129  Temp: 99.1 F (37.3 C)  Resp: 17    General: AAOx2 Cardiovascular: S1S2/RRR Respiratory: CTAB  Discharge Instructions    Discharge Medication List as of 03/28/2015  5:22 PM    CONTINUE these medications which have CHANGED   Details  ondansetron (ZOFRAN ODT) 4 MG disintegrating tablet Take 1 tablet (4 mg total) by mouth every 6 (six) hours as needed for nausea or vomiting., Starting 03/28/2015, Until Discontinued, Print    oxyCODONE (OXY IR/ROXICODONE) 5 MG immediate release tablet Take 1  tablet (5 mg total) by mouth every 4 (four) hours as needed for moderate pain., Starting 03/28/2015, Until Discontinued, Print    pantoprazole (PROTONIX) 40 MG tablet Take 1 tablet (40 mg total) by mouth 2 (two) times daily., Starting 03/28/2015, Until Discontinued, Print    polyethylene glycol (MIRALAX / GLYCOLAX) packet Take 17 g by mouth 2 (two) times daily. Can cut down  once having daily BMs, Starting 03/28/2015, Until Discontinued, Print      CONTINUE these medications which have NOT CHANGED   Details  albuterol (PROVENTIL HFA;VENTOLIN HFA) 108 (90 BASE) MCG/ACT inhaler Inhale 1-2 puffs into the lungs every 6 (six) hours as needed for wheezing or shortness of breath., Starting 03/03/2015, Until Discontinued, Normal    Ca Carbonate-Mag Hydroxide (ROLAIDS) 550-110 MG CHEW Chew 1 tablet by mouth 3 (three) times daily as needed (for heartburn). , Until Discontinued, Historical Med    feeding supplement, ENSURE ENLIVE, (ENSURE ENLIVE) LIQD Take 237 mLs by mouth 2 (two) times daily between meals., Starting 02/26/2015, Until Discontinued, Normal    furosemide (LASIX) 40 MG tablet Take 0.5 tablets (20 mg total) by mouth 2 (two) times daily., Starting 03/03/2015, Until Discontinued, Normal    hydrocortisone 1 % lotion Apply 1 application topically 2 (two) times daily., Starting 03/18/2015, Until Discontinued, Normal    lactulose (CHRONULAC) 10 GM/15ML solution Take 45 mLs (30 g total) by mouth every 8 (eight) hours., Starting 02/26/2015, Until Discontinued, Print    magnesium oxide (MAG-OX) 400 (241.3 MG) MG tablet Take 1 tablet (400 mg total) by mouth 2 (two) times daily., Starting 02/26/2015, Until Discontinued, Print    potassium chloride SA (K-DUR,KLOR-CON) 20 MEQ tablet Take 1 tablet (20 mEq total) by mouth 3 (three) times daily., Starting 02/26/2015, Until Discontinued, Print      STOP taking these medications     nystatin (MYCOSTATIN/NYSTOP) 100000 UNIT/GM POWD      spironolactone (ALDACTONE) 25 MG tablet      traMADol (ULTRAM) 50 MG tablet        No Known Allergies Follow-up Information    Follow up with Hospice at [facility]. Schedule an appointment as soon as possible for a visit in 2 weeks.   Specialty:  Hospice and Palliative Medicine   Contact information:   [address] [place] [address] [phone]        The results of  significant diagnostics from this hospitalization (including imaging, microbiology, ancillary and laboratory) are listed below for reference.    Significant Diagnostic Studies: Ct Abdomen Pelvis Wo Contrast  03/20/2015   CLINICAL DATA:  Vomiting blood twice last night.  Blood in stool.  EXAM: CT ABDOMEN AND PELVIS WITHOUT CONTRAST  TECHNIQUE: Multidetector CT imaging of the abdomen and pelvis was performed following the standard protocol without IV contrast.  COMPARISON:  None.  FINDINGS: Lower chest: Clear lung bases. Normal heart size. Distal esophageal dilatation with fluid within the esophagus.  Hepatobiliary: Diminutive size of the liver with a micronodular contour. No focal hepatic mass. Cholelithiasis. No intrahepatic or extrahepatic biliary duct dilatation. Massive ascites.  Pancreas: Normal.  Spleen: Normal.  Adrenals/Urinary Tract: Normal adrenal glands. Normal kidneys. Normal bladder.  Stomach/Bowel: No small bowel dilatation or obstruction. No pneumatosis, pneumoperitoneum or portal venous gas. Multiple small gastric varices.  Vascular/Lymphatic: Normal caliber abdominal aorta. No abdominal or pelvic lymphadenopathy.  Other: No focal fluid collection or hematoma. Generalized body wall edema.  Musculoskeletal: No acute osseous abnormality. No lytic or sclerotic osseous lesion.  IMPRESSION: 1. Cirrhotic liver.  Massive ascites. 2. Cholelithiasis. 3. Distal esophageal dilatation with fluid in the esophagus which may reflect gastroesophageal reflux.   Electronically Signed   By: Elige Ko   On: 03/20/2015 10:23   Dg Abd 1 View  03/28/2015   CLINICAL DATA:  Ileus  EXAM: ABDOMEN - 1 VIEW  COMPARISON:  03/27/2015  FINDINGS: Diffuse gaseous distention of bowel again noted, similar to prior study. No free air. No organomegaly or suspicious calcification.  IMPRESSION: Continued mild diffuse gaseous distention of bowel, likely ileus. No significant change.   Electronically Signed   By: Charlett Nose M.D.    On: 03/28/2015 08:23   Dg Abd 1 View  03/27/2015   CLINICAL DATA:  Ileus.  EXAM: ABDOMEN - 1 VIEW  COMPARISON:  03/26/2015  FINDINGS: Limited evaluation for free air given supine technique. Mild gaseous distension of multiple small bowel loops is stable to slightly increased from the prior study. There is a moderate amount of stool in the left colon. Increased density peripherally in the abdomen likely reflects ascites.  IMPRESSION: Stable to slightly increased gaseous distention of small bowel which may reflect ileus.   Electronically Signed   By: Sebastian Ache M.D.   On: 03/27/2015 09:20   Dg Abd 1 View  03/26/2015   CLINICAL DATA:  Abdominal distention.  EXAM: ABDOMEN - 1 VIEW  COMPARISON:  03/25/2015  FINDINGS: There is slight improvement in the degree of distention of small bowel loops, seen in the central of the abdomen, with relative paucity of gas peripherally, consistent with the presence of large volume ascites. Colonic bowel gas pattern is normal where seen.  IMPRESSION: Slight improvement in the degree of small bowel dilation, likely representing improving ileus.  Large volume abdominal ascites.   Electronically Signed   By: Ted Mcalpine M.D.   On: 03/26/2015 08:51   Dg Abd 1 View  03/25/2015   CLINICAL DATA:  Abdominal distension, ileus  EXAM: ABDOMEN - 1 VIEW  COMPARISON:  11/21/2014  FINDINGS: Again noted moderate gaseous distended small bowel loops suspicious for ileus or partial obstruction. There is some gas in proximal colon. Stool noted in descending colon.  IMPRESSION: Again noted moderate gaseous distended small bowel loops suspicious for ileus or partial obstruction. There is some gas in proximal colon. Stool noted in descending colon. No significant colonic distension.   Electronically Signed   By: Natasha Mead M.D.   On: 03/25/2015 10:06   Dg Abd 1 View  03/24/2015   CLINICAL DATA:  Nausea and vomiting for 2 days.  EXAM: ABDOMEN - 1 VIEW  COMPARISON:  03/20/2015  FINDINGS:  The bowel loops are located along the central aspect of the abdomen and this is compatible with the known abdominal ascites. Rectal tube or probe is present. Gas in the rectum. Mildly dilated loops of small bowel with some gas in the transverse colon. Limited evaluation for free air on these supine images.  IMPRESSION: Mildly dilated loops of small bowel with some gas in the colon. Findings are nonspecific but the differential would include an early ileus or obstructive process. Consider follow up imaging.   Electronically Signed   By: Richarda Overlie M.D.   On: 03/24/2015 16:57   US Paracentesis  03/27/2015   CLINICAL DATA:  End-stage liver disease with recurrent ascites. Request therapeutic paracentesis of up to 4 L.  EXAM: ULTRASOUND GUIDED PARACENTESIS  COMPARISON:  Previous paracentesis  PROCEDURE: An ultrasound guided paracentesis was thoroughly discussed with the patient and questions  answered. The benefits, risks, alternatives and complications were also discussed. The patient understands and wishes to proceed with the procedure. Written consent was obtained.  Ultrasound was performed to localize and mark an adequate pocket of fluid in the right lower quadrant of the abdomen. The area was then prepped and draped in the normal sterile fashion. 1% Lidocaine was used for local anesthesia. Under ultrasound guidance a Safe-T-Centesis catheter was introduced. Paracentesis was performed. The catheter was removed and a dressing applied.  COMPLICATIONS: None immediate  FINDINGS: A total of approximately 4 L of clear, amber colored fluid was removed. A fluid sample was not sent for laboratory analysis.  IMPRESSION: Successful ultrasound guided paracentesis yielding 4 L of ascites.  Read by: Brayton El PA-C   Electronically Signed   By: Gilmer Mor D.O.   On: 03/27/2015 11:08   US Paracentesis  03/03/2015   CLINICAL DATA:  Abdominal distention. Alcoholic cirrhosis. Massive ascites. Request diagnostic and  therapeutic paracentesis of up to 4 liters maximum.  EXAM: ULTRASOUND GUIDED LEFT LOWER QUADRANT PARACENTESIS  COMPARISON:  None.  PROCEDURE: An ultrasound guided paracentesis was thoroughly discussed with the patient and questions answered. The benefits, risks, alternatives and complications were also discussed. The patient understands and wishes to proceed with the procedure. Written consent was obtained.  Ultrasound was performed to localize and mark an adequate pocket of fluid in the left lower quadrant of the abdomen. The area was then prepped and draped in the normal sterile fashion. 1% Lidocaine was used for local anesthesia. Under ultrasound guidance a 19 gauge Yueh catheter was introduced. Paracentesis was performed. The catheter was removed and a dressing applied.  COMPLICATIONS: None.  FINDINGS: A total of approximately 4 liters of clear yellow fluid was removed. A fluid sample was not sent for laboratory analysis.  IMPRESSION: Successful ultrasound guided paracentesis yielding 4 liters of ascites.  Read by:  Corrin Parker, PA-C   Electronically Signed   By: Malachy Moan M.D.   On: 03/03/2015 14:22   Dg Chest Port 1 View  03/21/2015   CLINICAL DATA:  Aspiration.  Intubation.  EXAM: PORTABLE CHEST - 1 VIEW  COMPARISON:  03/20/2015  FINDINGS: The endotracheal tube has been repositioned. The tip is 2.1 cm above the carina. The cardiac silhouette, mediastinal and hilar contours are stable. Persistent low lung volumes with vascular crowding and streaky atelectasis. Vascular congestion and possible mild interstitial edema but no pleural effusion.  IMPRESSION: Endotracheal tube in good position, 2.1 cm above the carina.  Persistent low lung volumes with vascular crowding, atelectasis, vascular congestion and possible mild interstitial edema.   Electronically Signed   By: Rudie Meyer M.D.   On: 03/21/2015 08:11   Dg Chest Port 1 View  03/20/2015   CLINICAL DATA:  Endotracheal tube placement  EXAM:  PORTABLE CHEST - 1 VIEW  COMPARISON:  03/01/2015  FINDINGS: Endotracheal tube with the tip in the right mainstem bronchus. Recommend retracting the endotracheal tube 3.5 cm.  There are low lung volumes. There is prominence of the central pulmonary vasculature likely secondary to hypoinflation. There is no focal consolidation, pleural effusion or pneumothorax. The heart and mediastinum are stable.  Severe osteoarthritis of the right glenohumeral joint. Moderate osteoarthritis of the left glenohumeral joint.  IMPRESSION: 1. Endotracheal tube with the tip in the right mainstem bronchus. Recommend retracting the endotracheal tube 3.5 cm. These results were called by telephone at the time of interpretation on 03/20/2015 at 3:02 pm to [facility], who verbally acknowledged  these results.   Electronically Signed   By: Elige Ko   On: 03/20/2015 15:02   Dg Chest Port 1 View  03/01/2015   CLINICAL DATA:  Acute onset of scrotal edema, shortness of breath and chest pain. Initial encounter.  EXAM: PORTABLE CHEST - 1 VIEW  COMPARISON:  Chest radiograph performed 02/20/2015  FINDINGS: The lungs remain hypoexpanded. Vascular congestion and vascular crowding are noted. Patchy opacities within the right lung have improved. No pleural effusion or pneumothorax is seen.  The cardiomediastinal silhouette is borderline normal in size. No acute osseous abnormalities are identified.  IMPRESSION: Lungs hypoexpanded. Vascular congestion noted. Patchy opacities within the right lung have improved, which may reflect residual edema or pneumonia.   Electronically Signed   By: Roanna Raider M.D.   On: 03/01/2015 22:51    Microbiology: Recent Results (from the past 240 hour(s))  MRSA PCR Screening     Status: None   Collection Time: 03/20/15  1:29 PM  Result Value Ref Range Status   MRSA by PCR NEGATIVE NEGATIVE Final    Comment:        The GeneXpert MRSA Assay (FDA approved for NASAL specimens only), is one component of  a comprehensive MRSA colonization surveillance program. It is not intended to diagnose MRSA infection nor to guide or monitor treatment for MRSA infections.   Culture, body fluid-bottle     Status: None   Collection Time: 03/22/15 12:12 PM  Result Value Ref Range Status   Specimen Description FLUID PERITONEAL CAVITY  Final   Special Requests AEB  Final   Culture NO GROWTH 5 DAYS  Final   Report Status 03/27/2015 FINAL  Final  Gram stain     Status: None   Collection Time: 03/22/15 12:12 PM  Result Value Ref Range Status   Specimen Description FLUID PERITONEAL CAVITY  Final   Special Requests NONE  Final   Gram Stain   Final    FEW WBC PRESENT, PREDOMINANTLY MONONUCLEAR NO ORGANISMS SEEN    Report Status 03/22/2015 FINAL  Final     Labs: Basic Metabolic Panel:  Recent Labs Lab 03/22/15 2204 03/23/15 0407 03/25/15 0550 03/26/15 0544 03/27/15 0315  NA 136 138 138 134* 136  K 3.9 3.6 3.7 3.4* 3.7  CL 108 110 111 108 108  CO2 20* 23 24 21* 21*  GLUCOSE 84 85 86 84 124*  BUN CREATININE 1.04 0.99 0.97 0.99 1.09  CALCIUM 7.7* 8.0* 7.6* 7.3* 7.7*   Liver Function Tests:  Recent Labs Lab 03/23/15 0407  AST 43*  ALT 19  ALKPHOS 58  BILITOT 2.0*  PROT 6.1*  ALBUMIN 1.7*   No results for input(s): LIPASE, AMYLASE in the last 168 hours.  Recent Labs Lab 03/24/15 1007  AMMONIA 63*   CBC:  Recent Labs Lab 03/21/15 2345 03/23/15 0407 03/25/15 0550 03/26/15 0544 03/27/15 0315  WBC 9.5 7.1 7.8 8.1 9.0  NEUTROABS  --  3.0  --   --   --   HGB 8.0* 8.2* 9.0* 8.9* 9.4*  HCT 23.9* 24.9* 27.4* 26.2* 28.5*  MCV 81.0 82.5 84.6 83.2 85.1  PLT 65* 48* 61* 103* 57*   Cardiac Enzymes: No results for input(s): CKTOTAL, CKMB, CKMBINDEX, TROPONINI in the last 168 hours. BNP: BNP (last 3 results)  Recent Labs  02/06/15 1138  BNP 25.4    ProBNP (last 3 results) No results for input(s): PROBNP in the last 8760 hours.  CBG:  Recent Labs Lab  03/26/15 1633  GLUCAP 110*       Signed:  JOSEPH,PREETHA  [facility] 03/28/2015, 6:45 PM

## 2015-03-30 ENCOUNTER — Emergency Department (HOSPITAL_COMMUNITY): Payer: Medicaid - Out of State

## 2015-03-30 ENCOUNTER — Emergency Department (HOSPITAL_COMMUNITY)
Admission: EM | Admit: 2015-03-30 | Discharge: 2015-03-30 | Disposition: A | Discharge status: Hospice | Payer: Medicaid - Out of State | Attending: Emergency Medicine | Admitting: Emergency Medicine

## 2015-03-30 ENCOUNTER — Encounter (HOSPITAL_COMMUNITY): Payer: Self-pay | Admitting: Emergency Medicine

## 2015-03-30 DIAGNOSIS — Z87891 Personal history of nicotine dependence: Secondary | ICD-10-CM | POA: Insufficient documentation

## 2015-03-30 DIAGNOSIS — R000 Tachycardia, unspecified: Secondary | ICD-10-CM | POA: Diagnosis not present

## 2015-03-30 DIAGNOSIS — Z79899 Other long term (current) drug therapy: Secondary | ICD-10-CM | POA: Diagnosis not present

## 2015-03-30 DIAGNOSIS — Y998 Other external cause status: Secondary | ICD-10-CM | POA: Diagnosis not present

## 2015-03-30 DIAGNOSIS — I509 Heart failure, unspecified: Secondary | ICD-10-CM | POA: Diagnosis not present

## 2015-03-30 DIAGNOSIS — W1839XA Other fall on same level, initial encounter: Secondary | ICD-10-CM | POA: Diagnosis not present

## 2015-03-30 DIAGNOSIS — Z66 Do not resuscitate: Secondary | ICD-10-CM | POA: Diagnosis not present

## 2015-03-30 DIAGNOSIS — N189 Chronic kidney disease, unspecified: Secondary | ICD-10-CM | POA: Insufficient documentation

## 2015-03-30 DIAGNOSIS — S0990XA Unspecified injury of head, initial encounter: Secondary | ICD-10-CM | POA: Insufficient documentation

## 2015-03-30 DIAGNOSIS — Y9389 Activity, other specified: Secondary | ICD-10-CM | POA: Insufficient documentation

## 2015-03-30 DIAGNOSIS — Y92129 Unspecified place in nursing home as the place of occurrence of the external cause: Secondary | ICD-10-CM | POA: Insufficient documentation

## 2015-03-30 DIAGNOSIS — Z7952 Long term (current) use of systemic steroids: Secondary | ICD-10-CM | POA: Diagnosis not present

## 2015-03-30 DIAGNOSIS — R7989 Other specified abnormal findings of blood chemistry: Secondary | ICD-10-CM | POA: Insufficient documentation

## 2015-03-30 DIAGNOSIS — K7031 Alcoholic cirrhosis of liver with ascites: Secondary | ICD-10-CM | POA: Insufficient documentation

## 2015-03-30 DIAGNOSIS — S31819A Unspecified open wound of right buttock, initial encounter: Secondary | ICD-10-CM | POA: Insufficient documentation

## 2015-03-30 DIAGNOSIS — W19XXXA Unspecified fall, initial encounter: Secondary | ICD-10-CM

## 2015-03-30 LAB — CBC WITH DIFFERENTIAL/PLATELET
BASOS ABS: 0.1 10*3/uL (ref 0.0–0.1)
BASOS PCT: 1 %
Eosinophils Absolute: 0.2 10*3/uL (ref 0.0–0.7)
Eosinophils Relative: 2 %
HEMATOCRIT: 26.4 % — AB (ref 39.0–52.0)
HEMOGLOBIN: 9 g/dL — AB (ref 13.0–17.0)
LYMPHS PCT: 21 %
Lymphs Abs: 2.5 10*3/uL (ref 0.7–4.0)
MCH: 28.3 pg (ref 26.0–34.0)
MCHC: 34.1 g/dL (ref 30.0–36.0)
MCV: 83 fL (ref 78.0–100.0)
MONOS PCT: 18 %
Monocytes Absolute: 2.1 10*3/uL — ABNORMAL HIGH (ref 0.1–1.0)
NEUTROS ABS: 6.8 10*3/uL (ref 1.7–7.7)
Neutrophils Relative %: 58 %
Platelets: 62 10*3/uL — ABNORMAL LOW (ref 150–400)
RBC: 3.18 MIL/uL — ABNORMAL LOW (ref 4.22–5.81)
RDW: 17.5 % — ABNORMAL HIGH (ref 11.5–15.5)
WBC: 11.7 10*3/uL — ABNORMAL HIGH (ref 4.0–10.5)

## 2015-03-30 LAB — URINALYSIS, ROUTINE W REFLEX MICROSCOPIC
GLUCOSE, UA: NEGATIVE mg/dL
Ketones, ur: NEGATIVE mg/dL
Nitrite: POSITIVE — AB
PH: 5.5 (ref 5.0–8.0)
Protein, ur: 100 mg/dL — AB
SPECIFIC GRAVITY, URINE: 1.023 (ref 1.005–1.030)
Urobilinogen, UA: 1 mg/dL (ref 0.0–1.0)

## 2015-03-30 LAB — COMPREHENSIVE METABOLIC PANEL
ALT: 21 U/L (ref 17–63)
ANION GAP: 7 (ref 5–15)
AST: 62 U/L — AB (ref 15–41)
Albumin: 1.5 g/dL — ABNORMAL LOW (ref 3.5–5.0)
Alkaline Phosphatase: 65 U/L (ref 38–126)
BILIRUBIN TOTAL: 2 mg/dL — AB (ref 0.3–1.2)
BUN: 14 mg/dL (ref 6–20)
CHLORIDE: 108 mmol/L (ref 101–111)
CO2: 18 mmol/L — ABNORMAL LOW (ref 22–32)
Calcium: 7.8 mg/dL — ABNORMAL LOW (ref 8.9–10.3)
Creatinine, Ser: 1.36 mg/dL — ABNORMAL HIGH (ref 0.61–1.24)
GFR, EST NON AFRICAN AMERICAN: 59 mL/min — AB (ref >60)
Glucose, Bld: 88 mg/dL (ref 65–99)
POTASSIUM: 4.5 mmol/L (ref 3.5–5.1)
Sodium: 133 mmol/L — ABNORMAL LOW (ref 135–145)
Total Protein: 6.6 g/dL (ref 6.5–8.1)

## 2015-03-30 LAB — AMMONIA: AMMONIA: 141 umol/L — AB (ref 9–35)

## 2015-03-30 LAB — LIPASE, BLOOD: Lipase: <10 U/L — ABNORMAL LOW (ref 22–51)

## 2015-03-30 LAB — URINE MICROSCOPIC-ADD ON

## 2015-03-30 NOTE — ED Notes (Signed)
[facility] at bedside to transport patient.  SW, [place], at [employer], notified.

## 2015-03-30 NOTE — ED Notes (Signed)
49 yom from home via EMS presents for possible fall. PMHx DNR secondary to liver failure, cirrhosis, chronic renal dz. EMS states hospice and palliative care of [place] requested he be evaluated for any trauma injury and questionable abnormal labs. Chronic indwelling foley.

## 2015-03-30 NOTE — ED Provider Notes (Signed)
CSN: [id number]     Arrival date & time 03/30/15  0545 History   First MD Initiated Contact with Patient 03/30/15 0601     Chief Complaint  Patient presents with  . Fall    49 yom from home via EMS presents for possible fall. PMHx DNR secondary to liver failure, cirrhosis, chronic renal dz. EMS states hospice and palliative care of [facility] requested he be evaluated for any trauma injury and questionable abnormal labs. Chronic indwelling foley.     (Consider location/radiation/quality/duration/timing/severity/associated sxs/prior Treatment) HPI Comments: Pt is a 49 yo male with PMH of cirrhosis, chronic renal disease and DNR (secondary to liver failure) who presents to the ED via EMS s/p fall. EMS reports pt lives at home and is being cared for by hospice/palliative care who requested that the pt be evaluated for any trauma, injury or abnormal labs. Family reported to EMS that pt's mental status is at baseline. Pt is only oriented to self.    Past Medical History  Diagnosis Date  . Hepatic cirrhosis   . Esophageal varices     last banding in [place] June 2016  . Chronic renal failure     was on hemodialysis in June 2016  . Congestive heart failure     questionable hx & on lasix  . Ascites   . Renal insufficiency    Past Surgical History  Procedure Laterality Date  . Esophagogastroduodenoscopy w/ banding    . Esophagogastroduodenoscopy N/A 02/06/2015    Procedure: ESOPHAGOGASTRODUODENOSCOPY (EGD);  Surgeon: Rachael Fee, MD;  Location: [facility];  Service: Endoscopy;  Laterality: N/A;  at bedside in ICU  . Esophagogastroduodenoscopy N/A 03/20/2015    Procedure: ESOPHAGOGASTRODUODENOSCOPY (EGD);  Surgeon: Ruffin Frederick, MD;  Location: [facility] ENDOSCOPY;  Service: Gastroenterology;  Laterality: N/A;   Family History  Problem Relation Age of Onset  . COPD Mother   . Asthma Mother   . Leukemia Maternal Grandmother   . Congestive Heart Failure Maternal Grandfather   .  Cancer Other     2 aunts with known ca   Social History  Substance Use Topics  . Smoking status: Former Smoker    Quit date: 10/02/2014  . Smokeless tobacco: Never Used     Comment: off and on for 20 years  . Alcohol Use: No     Comment: Quit drinking EtOH prior to prolonged admission in April 2016    Review of Systems  Unable to perform ROS: Other      Allergies  Review of patient's allergies indicates no known allergies.  Home Medications   Prior to Admission medications   Medication Sig Start Date End Date Taking? Authorizing Provider  albuterol (PROVENTIL HFA;VENTOLIN HFA) 108 (90 BASE) MCG/ACT inhaler Inhale 1-2 puffs into the lungs every 6 (six) hours as needed for wheezing or shortness of breath. 03/03/15   Jaclyn Shaggy, MD  Ca Carbonate-Mag Hydroxide (ROLAIDS) 550-110 MG CHEW Chew 1 tablet by mouth 3 (three) times daily as needed (for heartburn).     Historical Provider, MD  feeding supplement, ENSURE ENLIVE, (ENSURE ENLIVE) LIQD Take 237 mLs by mouth 2 (two) times daily between meals. 02/26/15   Maryruth Bun [name], MD  furosemide (LASIX) 40 MG tablet Take 0.5 tablets (20 mg total) by mouth 2 (two) times daily. 03/03/15   Jaclyn Shaggy, MD  hydrocortisone 1 % lotion Apply 1 application topically 2 (two) times daily. 03/18/15   Jaclyn Shaggy, MD  lactulose (CHRONULAC) 10 GM/15ML solution Take 45 mLs (30  g total) by mouth every 8 (eight) hours. 02/26/15   Maryruth Bun [name], MD  magnesium oxide (MAG-OX) 400 (241.3 MG) MG tablet Take 1 tablet (400 mg total) by mouth 2 (two) times daily. 02/26/15   Maryruth Bun [name], MD  ondansetron (ZOFRAN ODT) 4 MG disintegrating tablet Take 1 tablet (4 mg total) by mouth every 6 (six) hours as needed for nausea or vomiting. 03/28/15   Zannie Cove, MD  oxyCODONE (OXY IR/ROXICODONE) 5 MG immediate release tablet Take 1 tablet (5 mg total) by mouth every 4 (four) hours as needed for moderate pain. 03/28/15   Zannie Cove, MD  pantoprazole (PROTONIX) 40  MG tablet Take 1 tablet (40 mg total) by mouth 2 (two) times daily. 03/28/15   Zannie Cove, MD  polyethylene glycol (MIRALAX / Ethelene Hal) packet Take 17 g by mouth 2 (two) times daily. Can cut down once having daily BMs 03/28/15   Zannie Cove, MD  potassium chloride SA (K-DUR,KLOR-CON) 20 MEQ tablet Take 1 tablet (20 mEq total) by mouth 3 (three) times daily. 02/26/15   Christina P [name], MD   BP 101/66 mmHg  Pulse 116  Temp(Src) 97.7 F (36.5 C) (Oral)  Resp 22  SpO2 97% Physical Exam  Constitutional: He appears well-developed and well-nourished. No distress.  HENT:  Head: Normocephalic and atraumatic. Head is without raccoon's eyes, without Battle's sign, without abrasion, without contusion and without laceration.  Right Ear: Tympanic membrane normal.  Left Ear: Tympanic membrane normal.  Nose: Nose normal.  Mouth/Throat: Uvula is midline, oropharynx is clear and moist and mucous membranes are normal.  Eyes: Conjunctivae and EOM are normal. Pupils are equal, round, and reactive to light. Right eye exhibits no discharge. Left eye exhibits no discharge. No scleral icterus.  Neck: Normal range of motion. Neck supple.  Cardiovascular: Regular rhythm, normal heart sounds and intact distal pulses.   tachycardic  Pulmonary/Chest: Effort normal and breath sounds normal. He has no wheezes. He has no rales. He exhibits no tenderness.  Abdominal: Soft. Bowel sounds are normal. He exhibits distension and ascites. He exhibits no mass. There is tenderness (diffuse abdmoinal tenderenss). There is no rebound and no guarding.  Musculoskeletal: He exhibits edema.  No midline C/T/L spine tenderness. 2+ pitting edema extending up to bilateral thighs.   Lymphadenopathy:    He has no cervical adenopathy.  Neurological: No sensory deficit.  A & O to person only. Unable to perform full neuro exam due to mental status. Pt able to move all extremities.  Skin: Skin is warm and dry. He is not diaphoretic.   Skin tear noted to right gluteal region, no active bleeding. No other contusions, abrasions, lacerations or hematomas noted.   Nursing note and vitals reviewed.   ED Course  Procedures (including critical care time) Labs Review Labs Reviewed - No data to display  Imaging Review Dg Abd 1 View  03/28/2015   CLINICAL DATA:  Ileus  EXAM: ABDOMEN - 1 VIEW  COMPARISON:  03/27/2015  FINDINGS: Diffuse gaseous distention of bowel again noted, similar to prior study. No free air. No organomegaly or suspicious calcification.  IMPRESSION: Continued mild diffuse gaseous distention of bowel, likely ileus. No significant change.   Electronically Signed   By: Charlett Nose M.D.   On: 03/28/2015 08:23   I have personally reviewed and evaluated these images and lab results as part of my medical decision-making.  Filed Vitals:   03/30/15 1058  BP: 101/59  Pulse: 114  Temp:   Resp:  16     MDM   Final diagnoses:  Alcoholic cirrhosis of liver with ascites  DNR (do not resuscitate)  Fall, initial encounter  Increased ammonia level    Pt presents via EMS s/p fall. PMH of cirrhosis, CKD, CHF. Pt is DNR. He is only alert to self. VSS. Skin tear noted to right gluteal region, no other signs of trauma or injury. Neuro exam limited due to pt's mental status. Exam revealed ascites, 2+ pitting edema extending up to thighs. No obvious head trauma. Labs consistent with pt's baseline values with exception of elevated ammonia. CT head negative. UA shows UTI.  I called pt's wife who reports that pt uses wheelchair at home but notes that last night the pt was acting "delusional" and kept trying to get out of bed and walk around. She notes he fell and hit his head and they called EMS to have him checked out and try to get a bed in a Hospice facility. Wife reports her and the pt's mother can no longer care for the pt at home. She notes the pt is "somewhat" verbal at baseline. Case management consulted. Case management  reports the pt has a bed at [facility]. Nurse reports pt has recently been refusing to take lactulose. Hospice reports they will accept pt and manage pt's UTI and elevated ammonia. Plan to d/c pt to Hospice.   Satira Sark [place], [place] 03/30/15 1604  Tomasita Crumble, MD 03/30/15 [id number]

## 2015-03-30 NOTE — ED Notes (Signed)
Patient transported to CT 

## 2015-03-30 NOTE — ED Notes (Signed)
[facility] called for transportation to [facility]. 

## 2015-03-30 NOTE — Progress Notes (Addendum)
1121 CM made [place] aware of ptar called for transport to [facility]. Wife present in room Cm discussed pt transport and visiting hours.  1003 CM received a return call from [facility].  The home care has been consulted along with the hospice SW, Gwenlyn Perking who is assisting to get pt to [facility] This pt is uninsured and medicaid application is being completed.  Kennyth Arnold states SW will return call to CM when paperwork completed and pt able to d/c to [facility] today. CM discussed [facility] ED concern with timeframe for disposition CM updated Joni Reining EDPA/NP 1610 Noted CM consult Spoke with Joni Reining EDP/PA/NP.  Voiced interest in hospice facility vs home which pt already has.  Family per EDPA is unable to continue to take care of pt at home  CM left a message for Raquel James with staff at [facility]. Staff to have Misty Stanley return a call to CM to inquire about facility placement

## 2015-03-30 NOTE — ED Notes (Signed)
Bed: WA05 Expected date:  Expected time:  Means of arrival:  Comments: Ems-fall 

## 2015-03-30 NOTE — ED Notes (Signed)
Assessment completed and documented via flowsheet.

## 2015-03-30 NOTE — Progress Notes (Signed)
   03/30/15 1100  Clinical Encounter Type  Visited With Patient and family together  Visit Type Initial;Social support;Psychological support;ED  Referral From Nurse  Consult/Referral To Chaplain  Spiritual Encounters  Spiritual Needs Emotional;Other (Comment);Grief support Veterinary surgeon)  Stress Factors  Patient Stress Factors None identified  Family Stress Factors Health changes;Major life changes   The Chaplain visited the patient per request by the nurse. The patient and his family had made the decision to transfer to Hospice and were having a difficult time emotionally. The wife and the niece were at the patient's bedside upon the Chaplain's arrival. The patient was asleep and the lights were dim. The wife appeared upset. The patient's wife and niece stated that they were doing all right, but stated that it was difficult dealing with his health changes. The wife was too upset to talk to the Chaplain, but thanked her for coming in to check on them.  Chaplain interventions included pastoral conversation, psychological/emotional support, and staff conversation.  The patient and his family do not require follow-up.

## 2015-04-01 ENCOUNTER — Ambulatory Visit: Payer: PRIVATE HEALTH INSURANCE | Admitting: Family Medicine

## 2015-04-03 DEATH — deceased

## 2017-04-03 IMAGING — DX DG ABDOMEN 1V
2 series · 2 of 2 positions shown · non-contrast
Comparison: 02/17/2015

CLINICAL DATA: Abdominal distension

EXAM:
ABDOMEN - 1 VIEW

[abdomen kub (1 of 2)]
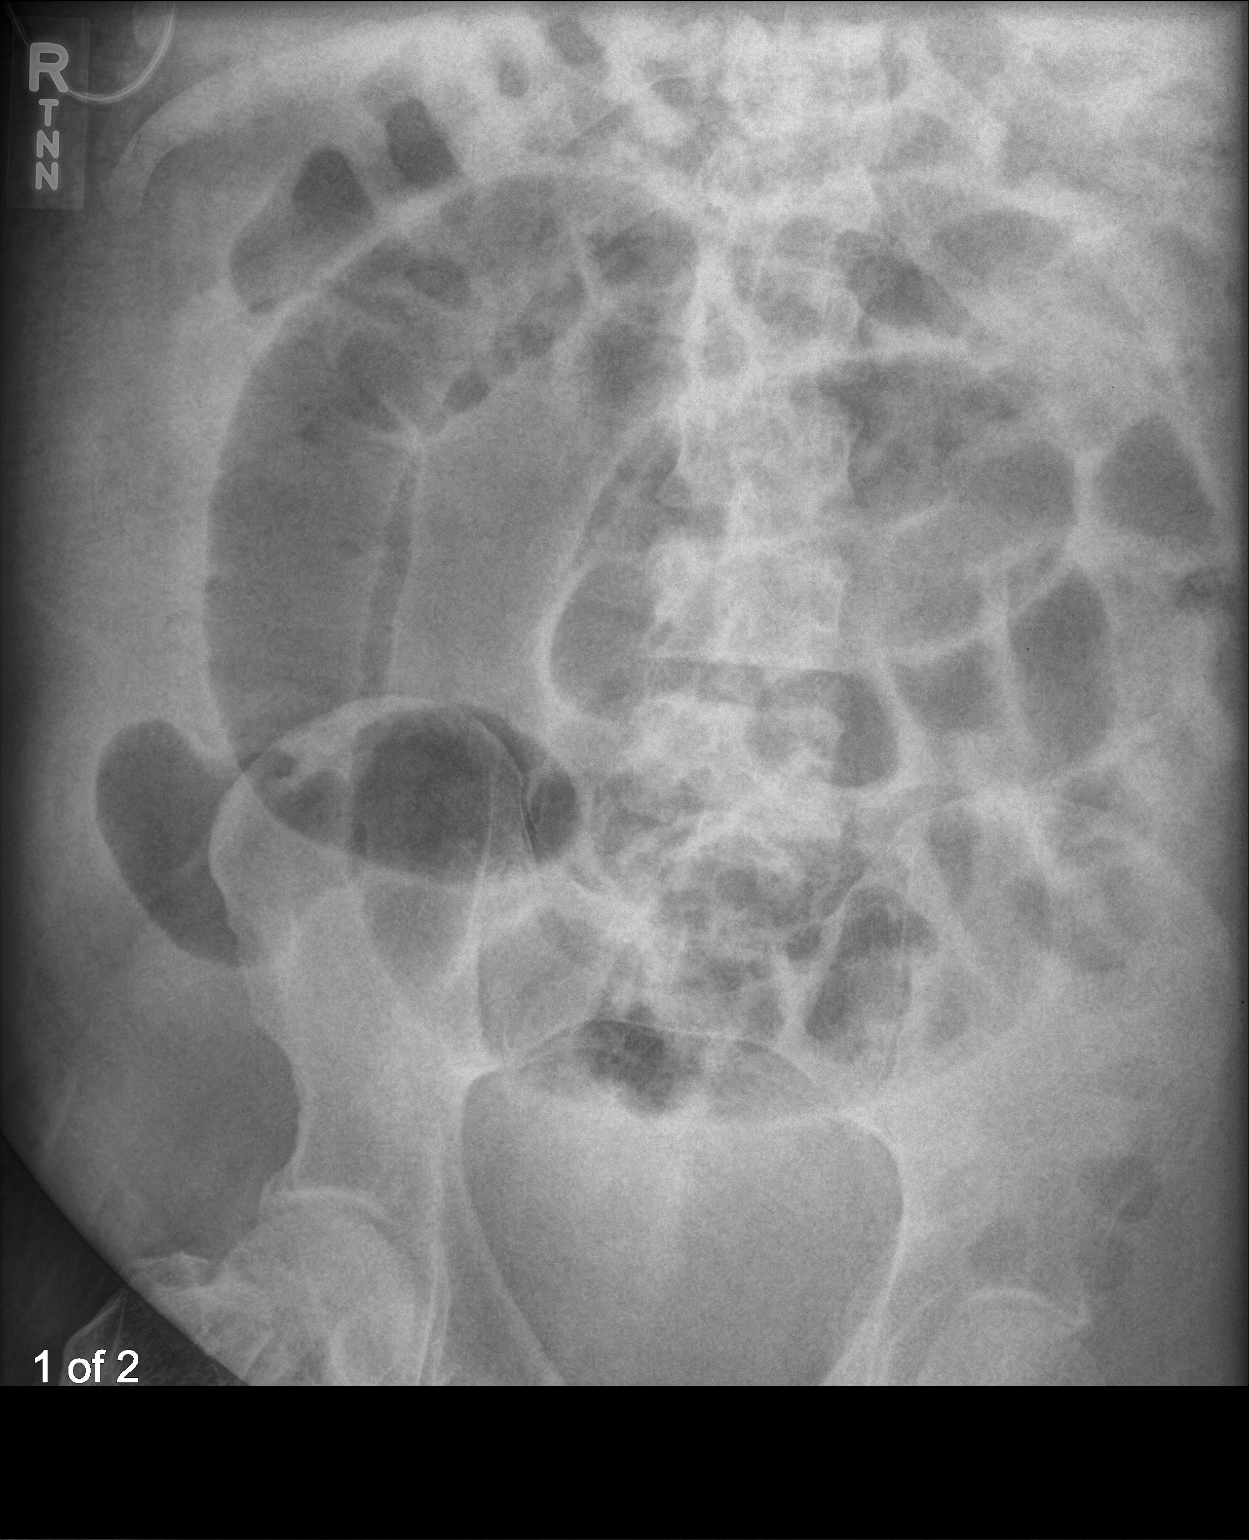

[abdomen kub (2 of 2)]
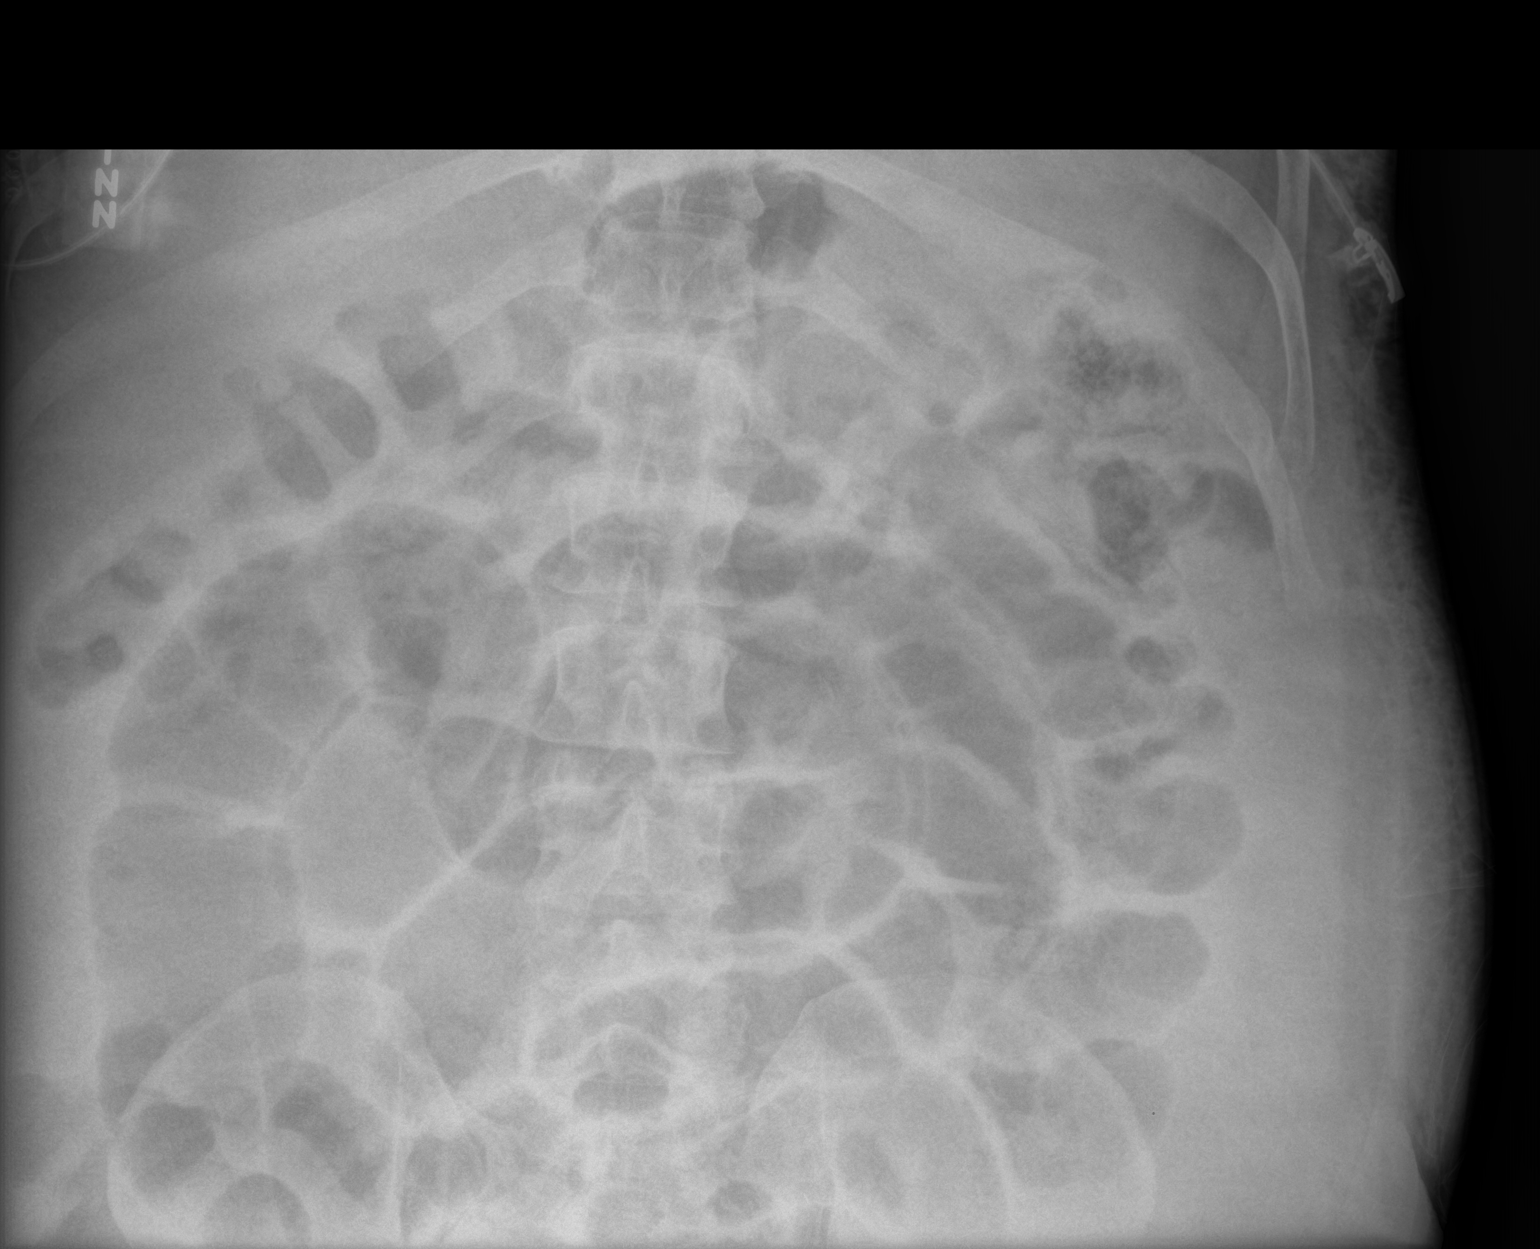

[2 of 2 positions shown; findings below may reference images not displayed]

FINDINGS: Mild small bowel dilatation is noted which is increased in the
interval from the prior exam. Central crowding of the [REDACTED]ings
is noted likely related to recurrent ascites. Some changes of
anasarca are seen. No acute bony abnormality is noted.
IMPRESSION: Likely recurrent ascites.

New small bowel dilatation likely representing a partial small bowel
obstruction. Correlation with the physical exam is recommended.

## 2017-04-03 IMAGING — DX DG CHEST 1V PORT
1 series · 1 of 1 positions shown · non-contrast
Comparison: 02/18/2015

CLINICAL DATA: Respiratory failure

EXAM:
PORTABLE CHEST - 1 VIEW

[chest ap]
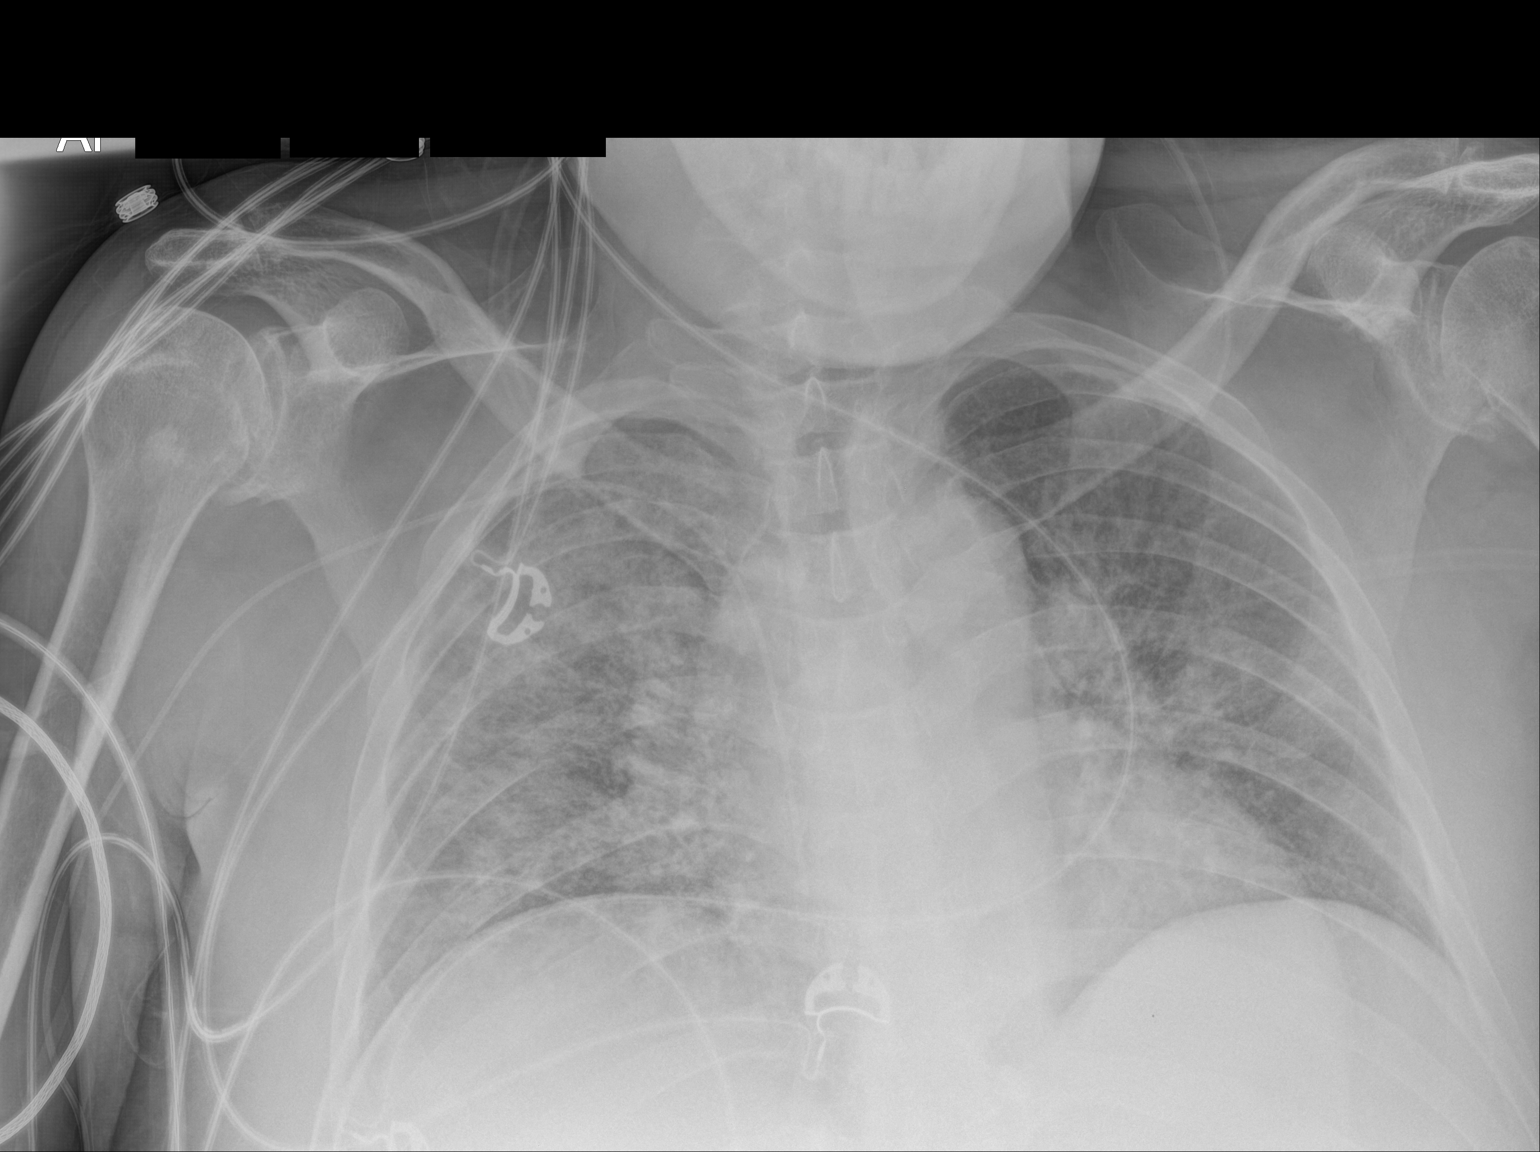

[1 of 1 positions shown; findings below may reference images not displayed]

FINDINGS: Progressive interstitial and airspace opacity in the bilateral
lungs, asymmetric to the right. No pleural effusion. Normal heart
size for technique.

Stable right upper extremity PICC, tip at the upper right atrium.
IMPRESSION: Progressive bilateral lung opacity. Edema or pneumonia could have
this appearance.

## 2017-04-05 IMAGING — CR DG ABDOMEN 1V
1 series · 2 of 2 positions shown · non-contrast
Comparison: 02/19/2015

CLINICAL DATA: Abdominal distention. On ventilator. Hepatic
cirrhosis chronic renal failure.

EXAM:
ABDOMEN - 1 VIEW

[Series 1: ap (kub) · U · 2 of 2 slices shown]
[im 1/2]
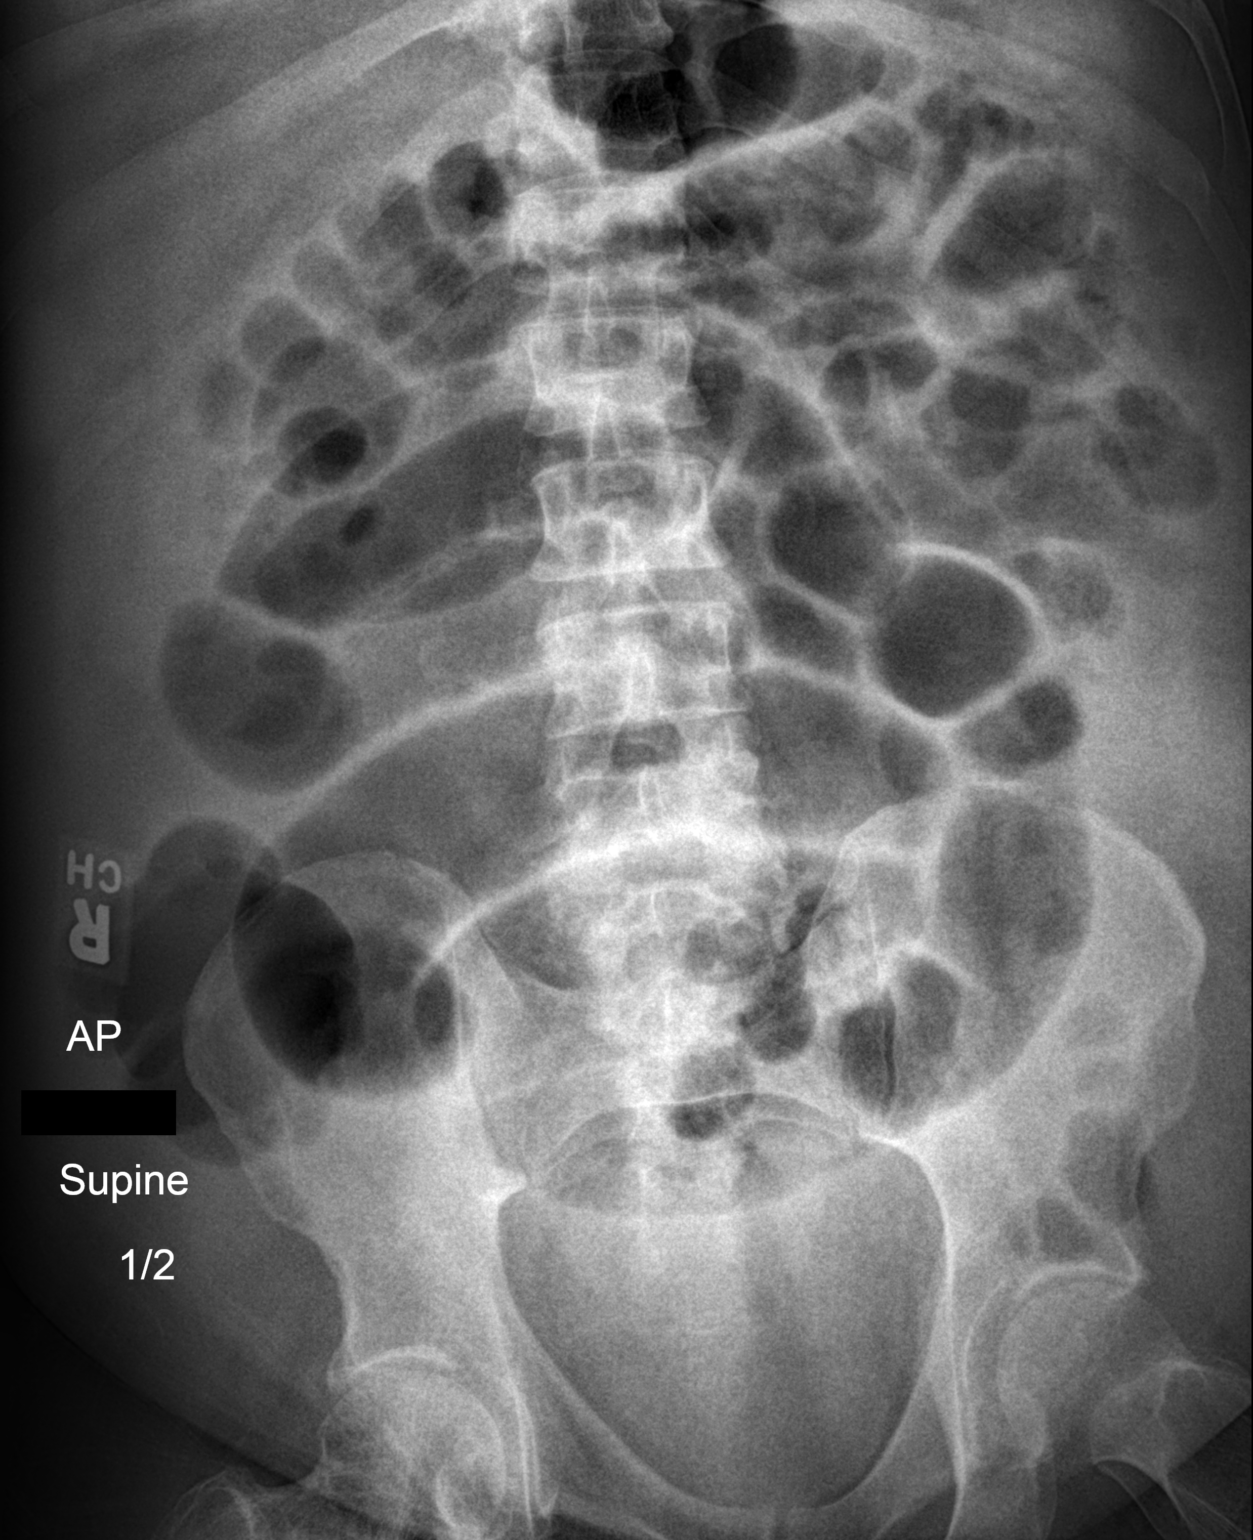
[im 2/2]
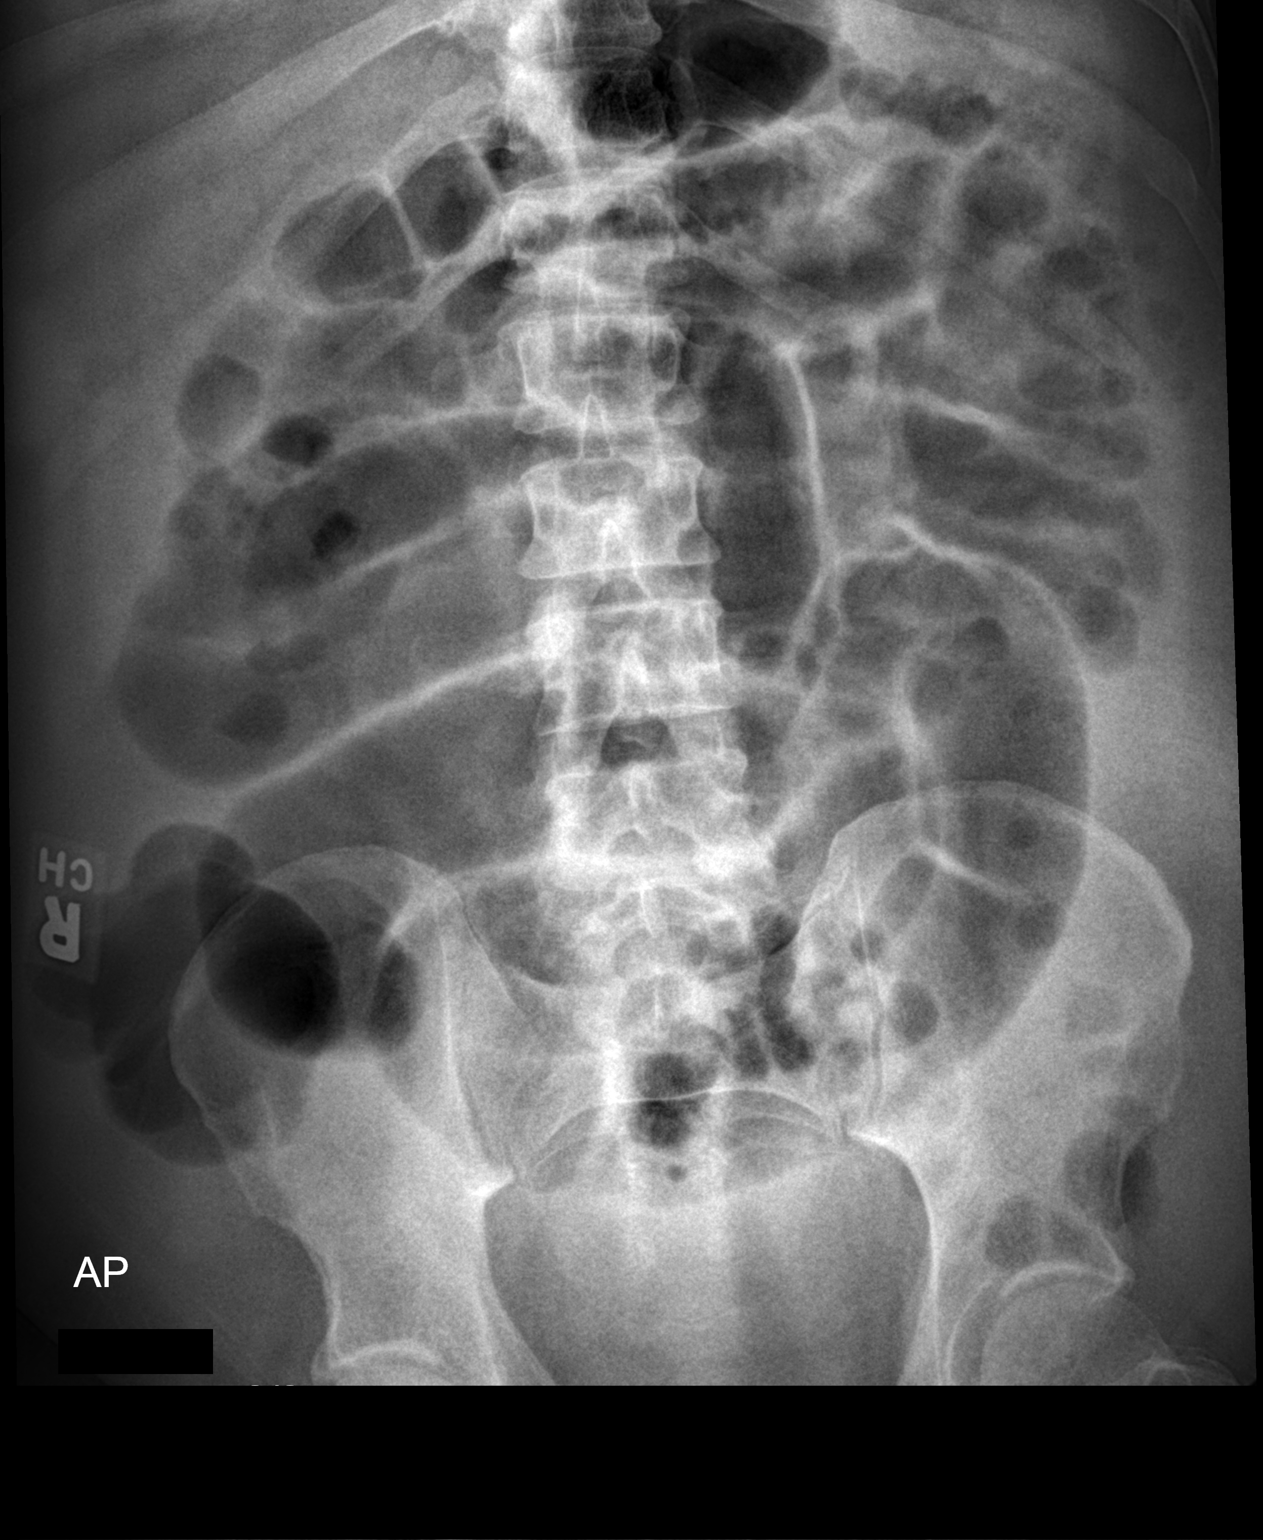

[2 of 2 positions shown; findings below may reference images not displayed]

FINDINGS: Multiple moderately dilated small bowel loops show no significant
change. There is a relative paucity of colonic gas. Distal small
bowel obstruction cannot be excluded.
IMPRESSION: No significant change in moderately dilated small bowel loops.
Distal small bowel obstruction cannot be excluded.

## 2017-04-05 IMAGING — DX DG ABD PORTABLE 1V
1 series · 2 of 2 positions shown · non-contrast
Comparison: 02/21/2015 at [DATE] a.m.

CLINICAL DATA: Status post nasogastric tube placement.

EXAM:
PORTABLE ABDOMEN - 1 VIEW

[Series 1: abdomen kub · 0.14mm/px · 2 of 2 slices shown]
[im 1/2]
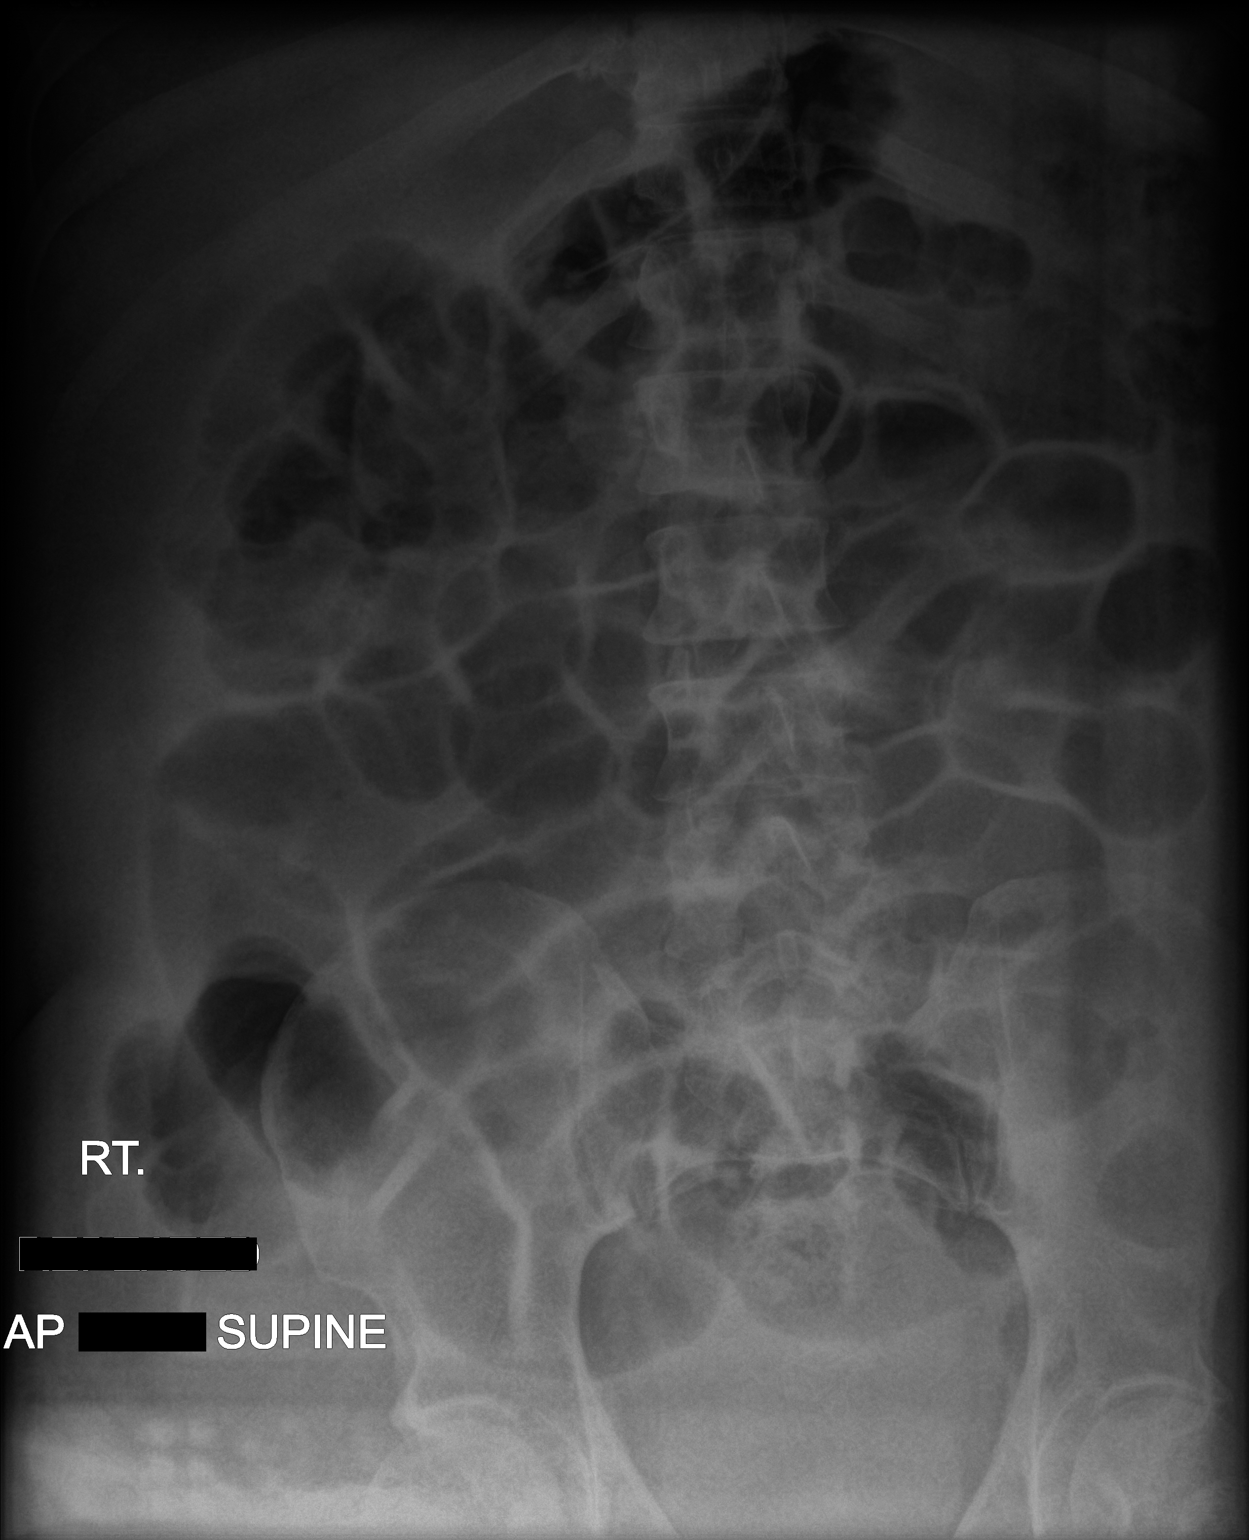
[im 2/2]
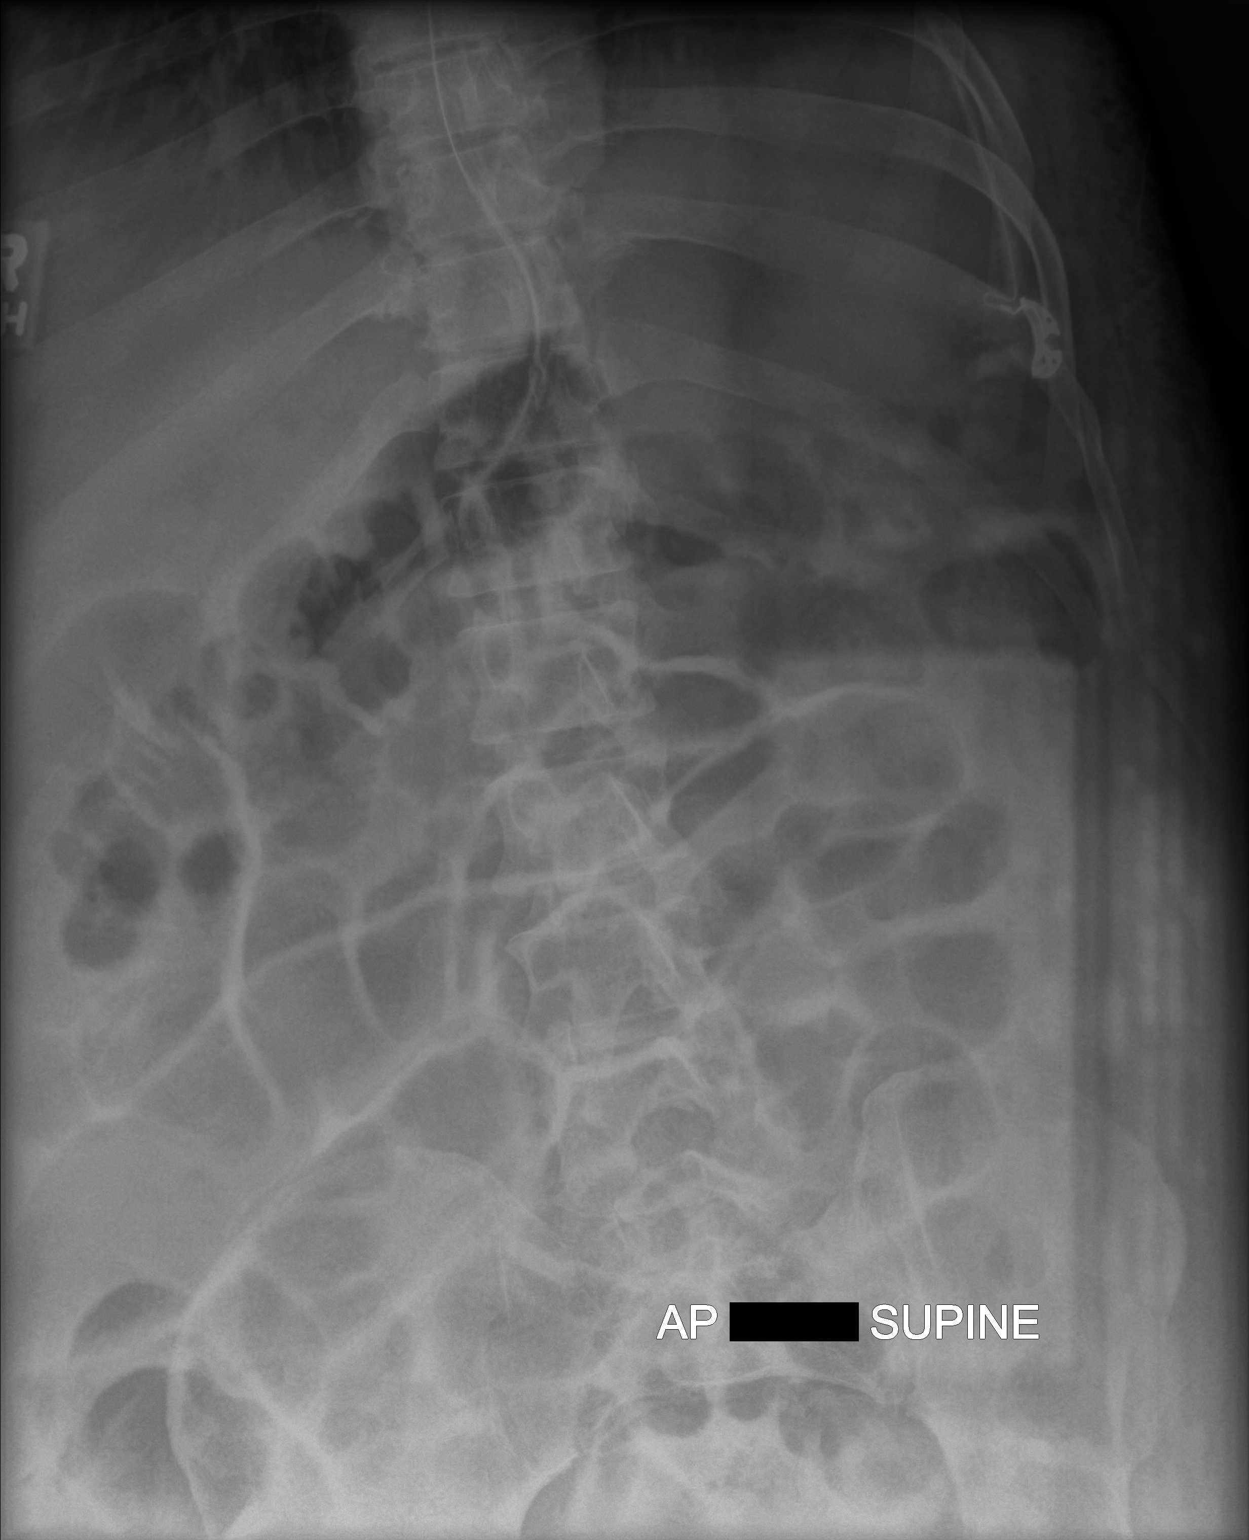

[2 of 2 positions shown; findings below may reference images not displayed]

FINDINGS: Nasogastric tube tip projects in the distal stomach, well
positioned.

Air-filled prominent small bowel is again noted, unchanged from the
earlier study.
IMPRESSION: Nasogastric tube tip well-positioned within the distal stomach.
